# Patient Record
Sex: Male | Born: 1935 | Race: White | Hispanic: No | Marital: Married | Smoking: Former smoker
Health system: Southern US, Community
[De-identification: ages and names within clinical notes are randomized; demographics above are authoritative.]

## PROBLEM LIST (undated history)

## (undated) DIAGNOSIS — I639 Cerebral infarction, unspecified: Secondary | ICD-10-CM

## (undated) DIAGNOSIS — I4949 Other premature depolarization: Secondary | ICD-10-CM

## (undated) DIAGNOSIS — E7800 Pure hypercholesterolemia, unspecified: Secondary | ICD-10-CM

## (undated) DIAGNOSIS — I2699 Other pulmonary embolism without acute cor pulmonale: Secondary | ICD-10-CM

## (undated) DIAGNOSIS — M1990 Unspecified osteoarthritis, unspecified site: Secondary | ICD-10-CM

## (undated) DIAGNOSIS — I10 Essential (primary) hypertension: Secondary | ICD-10-CM

## (undated) DIAGNOSIS — C801 Malignant (primary) neoplasm, unspecified: Secondary | ICD-10-CM

## (undated) DIAGNOSIS — I4891 Unspecified atrial fibrillation: Secondary | ICD-10-CM

## (undated) DIAGNOSIS — J449 Chronic obstructive pulmonary disease, unspecified: Secondary | ICD-10-CM

## (undated) DIAGNOSIS — R7303 Prediabetes: Secondary | ICD-10-CM

## (undated) DIAGNOSIS — Z86711 Personal history of pulmonary embolism: Secondary | ICD-10-CM

## (undated) DIAGNOSIS — Z8489 Family history of other specified conditions: Secondary | ICD-10-CM

## (undated) DIAGNOSIS — R739 Hyperglycemia, unspecified: Secondary | ICD-10-CM

## (undated) HISTORY — DX: Unspecified atrial fibrillation: I48.91

## (undated) HISTORY — DX: Unspecified osteoarthritis, unspecified site: M19.90

## (undated) HISTORY — DX: Cerebral infarction, unspecified: I63.9

## (undated) HISTORY — PX: JOINT REPLACEMENT: SHX530

## (undated) HISTORY — DX: Essential (primary) hypertension: I10

## (undated) HISTORY — PX: TONSILLECTOMY: SUR1361

## (undated) HISTORY — DX: Chronic obstructive pulmonary disease, unspecified: J44.9

## (undated) HISTORY — DX: Hyperglycemia, unspecified: R73.9

## (undated) HISTORY — DX: Other premature depolarization: I49.49

## (undated) HISTORY — DX: Personal history of pulmonary embolism: Z86.711

## (undated) HISTORY — DX: Pure hypercholesterolemia, unspecified: E78.00

## (undated) HISTORY — DX: Other pulmonary embolism without acute cor pulmonale: I26.99

---

## 1995-07-13 DIAGNOSIS — I639 Cerebral infarction, unspecified: Secondary | ICD-10-CM

## 1995-07-13 HISTORY — DX: Cerebral infarction, unspecified: I63.9

## 2000-08-12 DIAGNOSIS — Z86711 Personal history of pulmonary embolism: Secondary | ICD-10-CM

## 2000-08-12 HISTORY — DX: Personal history of pulmonary embolism: Z86.711

## 2001-06-14 ENCOUNTER — Encounter: Payer: Self-pay | Admitting: Cardiovascular Disease

## 2002-07-11 ENCOUNTER — Encounter: Payer: Self-pay | Admitting: Cardiovascular Disease

## 2004-01-06 ENCOUNTER — Encounter: Payer: Self-pay | Admitting: Cardiovascular Disease

## 2004-04-11 ENCOUNTER — Encounter: Payer: Self-pay | Admitting: Internal Medicine

## 2005-12-02 ENCOUNTER — Ambulatory Visit: Payer: Self-pay | Admitting: Internal Medicine

## 2005-12-23 ENCOUNTER — Ambulatory Visit: Payer: Self-pay | Admitting: Internal Medicine

## 2006-03-15 ENCOUNTER — Emergency Department: Payer: Self-pay | Admitting: Emergency Medicine

## 2006-04-14 ENCOUNTER — Ambulatory Visit: Payer: Self-pay | Admitting: Urology

## 2006-06-28 ENCOUNTER — Other Ambulatory Visit: Payer: Self-pay

## 2006-06-28 ENCOUNTER — Ambulatory Visit: Payer: Self-pay | Admitting: Gastroenterology

## 2008-10-15 ENCOUNTER — Ambulatory Visit: Payer: Self-pay | Admitting: Gastroenterology

## 2009-02-25 ENCOUNTER — Ambulatory Visit: Payer: Self-pay | Admitting: Internal Medicine

## 2010-02-04 ENCOUNTER — Encounter: Payer: Self-pay | Admitting: Cardiovascular Disease

## 2010-02-04 ENCOUNTER — Encounter: Payer: Self-pay | Admitting: Internal Medicine

## 2010-02-17 ENCOUNTER — Encounter: Payer: Self-pay | Admitting: Internal Medicine

## 2010-03-05 ENCOUNTER — Ambulatory Visit: Payer: Self-pay | Admitting: Internal Medicine

## 2010-03-05 ENCOUNTER — Encounter: Payer: Self-pay | Admitting: Cardiovascular Disease

## 2010-03-05 DIAGNOSIS — E669 Obesity, unspecified: Secondary | ICD-10-CM

## 2010-03-05 DIAGNOSIS — J449 Chronic obstructive pulmonary disease, unspecified: Secondary | ICD-10-CM

## 2010-03-05 DIAGNOSIS — R0609 Other forms of dyspnea: Secondary | ICD-10-CM

## 2010-03-05 DIAGNOSIS — R0602 Shortness of breath: Secondary | ICD-10-CM | POA: Insufficient documentation

## 2010-03-05 DIAGNOSIS — I4949 Other premature depolarization: Secondary | ICD-10-CM

## 2010-03-05 DIAGNOSIS — I4891 Unspecified atrial fibrillation: Secondary | ICD-10-CM

## 2010-03-05 DIAGNOSIS — R0989 Other specified symptoms and signs involving the circulatory and respiratory systems: Secondary | ICD-10-CM

## 2010-03-05 HISTORY — DX: Unspecified atrial fibrillation: I48.91

## 2010-03-05 HISTORY — DX: Other premature depolarization: I49.49

## 2010-03-19 ENCOUNTER — Ambulatory Visit: Payer: Self-pay

## 2010-03-27 ENCOUNTER — Ambulatory Visit: Payer: Self-pay | Admitting: Internal Medicine

## 2010-04-01 ENCOUNTER — Telehealth: Payer: Self-pay | Admitting: Cardiovascular Disease

## 2010-08-11 NOTE — Letter (Signed)
Summary: [facility] New Visit Note   [facility] New Visit Note   Imported By: Roderic Ovens 04/01/2010 15:19:35  _____________________________________________________________________  External Attachment:    Type:   Image     Comment:   External Document

## 2010-08-11 NOTE — Assessment & Plan Note (Signed)
Summary: ROV/AMD   Visit Type:  Follow-up Primary Provider:  Dr. Boykin Peek  CC:  Denies chest pain or shortness of breath.  "Doing well.".  History of Present Illness: [name] Hardin returns  today for followup of  PVC's in the setting of atrial fibrillation.  He appears to be stable since I last saw him.  He denies c/p.  His dyspnea appears to be class 2. He does not feel palpitations and does not know that he is in atrial fibrillation.  I do not have any additional information as to the duration of his atrial fibrillation.  He also has COPD and morbid obesity.  He has never had syncope and denies c/p.  He is interested in considering Pradaxa.  Current Medications (verified): 1)  Advair Diskus 250-50 Mcg/dose Aepb (Fluticasone-Salmeterol) .... One Puff Two Times A Day 2)  Allopurinol 300 Mg Tabs (Allopurinol) .... One Tablet Once Daily 3)  Aspir-Low 81 Mg Tbec (Aspirin) 4)  B12 .... Administer Once Monthly 5)  Colcrys 0.6 Mg Tabs (Colchicine) .... One Tablet Three Times A Day As Needed 6)  Coumadin 5 Mg Tabs (Warfarin Sodium) .... One Tablet Once Daily 7)  Cpap 8)  Diltiazem Hcl Er Beads 240 Mg Xr24h-Cap (Diltiazem Hcl Er Beads) .... Once Tablet Once Daily 9)  Simvastatin 40 Mg Tabs (Simvastatin) .... Take One Tablet By Mouth Daily At Bedtime 10)  Furosemide 20 Mg Tabs (Furosemide) .... Take One Tablet By Mouth Daily. 11)  Potassium Chloride Cr 10 Meq Cr-Caps (Potassium Chloride) .... Take One Tablet By Mouth Daily 12)  Co-Gesic 5-500 Mg Tabs (Hydrocodone-Acetaminophen) .Marland Kitchen.. 1 Tablet At Bedtime 13)  Spiriva Handihaler 18 Mcg Caps (Tiotropium Bromide Monohydrate) .... Once Daily  Allergies (verified): No Known Drug Allergies  Past History:  Past Medical History: Last updated: 03/05/2010 Cerebrovascular disease s/p stroke in 1997 fully recovered COPD Hypertension Hypercholesterolemia Osteoarthritis Obesity Heperglycemia Hx of pulmonary embolus February 2002  Past  Surgical History: Last updated: 03/05/2010 Tonsillectomy  Family History: Last updated: 03/05/2010 Family History of Hyperlipidemia:   Review of Systems  The patient denies chest pain, syncope, dyspnea on exertion, and peripheral edema.    Vital Signs:  Patient profile:   77 year old male Height:      72 inches Weight:      326 pounds BMI:     44.37 Pulse rate:   80 / minute BP sitting:   130 / 80  (left arm) Cuff size:   large  Vitals Entered By: Bishop Dublin, CMA (March 27, 2010 10:14 AM)  Physical Exam  General:  Obese, well developed, well nourished, in no acute distress.  HEENT: normal Neck: supple. No JVD. Carotids 2+ bilaterally no bruits Cor: RRR no rubs, gallops or murmur Lungs: CTA Ab: soft, nontender. nondistended. No HSM. Good bowel sounds. Obese. Ext: warm. no cyanosis or clubbing. 1+ peripheral edema Neuro: alert and oriented. Grossly nonfocal. affect pleasant    Impression & Recommendations:  Problem # 1:  ATRIAL FIBRILLATION (ICD-427.31) His symptoms appear to be controlled.  I have discussed the possibility of switching to Pradaxa from coumadin and he is considering his options. His updated medication list for this problem includes:    Aspir-low 81 Mg Tbec (Aspirin)    Coumadin 5 Mg Tabs (Warfarin sodium) ..... One tablet once daily  Problem # 2:  PREMATURE VENTRICULAR CONTRACTIONS (ICD-427.69) These appear to be asymptomatic.  Will follow. His updated medication list for this problem includes:    Aspir-low 81 Mg Tbec (  Aspirin)    Coumadin 5 Mg Tabs (Warfarin sodium) ..... One tablet once daily    Diltiazem Hcl Er Beads 240 Mg Xr24h-cap (Diltiazem hcl er beads) ..... Once tablet once daily  Problem # 3:  SHORTNESS OF BREATH (ICD-786.05) I suspect this is multifactorial.  He has preserved LV function.  I have instructed him on the importance of weight loss.  He will continue his meds below and maintain a low sodium diet. His updated  medication list for this problem includes:    Aspir-low 81 Mg Tbec (Aspirin)    Diltiazem Hcl Er Beads 240 Mg Xr24h-cap (Diltiazem hcl er beads) ..... Once tablet once daily    Furosemide 20 Mg Tabs (Furosemide) .Marland Kitchen... Take one tablet by mouth daily.

## 2010-08-11 NOTE — Progress Notes (Signed)
Summary: PLEASE CALL  Phone Note Call from Patient Call back at Home Phone [phone]   Caller: SELF Call For: [facility] Summary of Call: PT LEFT A MESSAGE THAT HE WOULD LIKE A CALL BACK Initial call taken by: Harlon Flor,  April 01, 2010 10:41 AM  Follow-up for Phone Call        [facility] TCB Benedict Needy, RN  April 01, 2010 11:39 AM   Additional Follow-up for Phone Call Additional follow up Details #1::        PAT RETURNING CALL-DOES NOT NEED A CALL BACK-STATES THAT EVERYTHING HAS BEEN STRAIGHTENED OUT. Additional Follow-up by: Harlon Flor,  April 01, 2010 2:24 PM

## 2010-08-11 NOTE — Assessment & Plan Note (Signed)
Summary: NP6/AMD   Visit Type:  New Pt Primary Provider:  Dr. Boykin Peek  CC:  no complaints.  History of Present Illness: Mr. [name] Hardin is referred today by Dr. Dareen Piano for evaluation PVC's in the setting of atrial fibrillation.  The patient has had a long period of sob which may have worsened over the past several months. He does not feel palpitations and does not know that he is in atrial fibrillation.  I do not have any additional information as to the duration of his atrial fibrillation.  He also has COPD and morbid obesity.  He states that he has been heavy for many yrs.  He has developed worsening peripheral edema. He had a 24 hour holter which demonstrated atrial fibrillation. On the holter there were PVC's and Ashman beats.  He has never had syncope and denies c/p.   Problems Prior to Update: None  Medications Prior to Update: 1)  Advair Diskus 250-50 Mcg/dose Aepb (Fluticasone-Salmeterol) .... One Puff Two Times A Day 2)  Allopurinol 300 Mg Tabs (Allopurinol) .... One Tablet Once Daily 3)  Aspir-Low 81 Mg Tbec (Aspirin) 4)  B12 .... Administer Once Monthly 5)  Colcrys 0.6 Mg Tabs (Colchicine) .... One Tablet Three Times A Day As Needed 6)  Coumadin 4 Mg Tabs (Warfarin Sodium) .... Take As Directed 7)  Cpap 8)  Diltiazem Hcl Er Beads 240 Mg Xr24h-Cap (Diltiazem Hcl Er Beads) .... Once Tablet Once Daily  Current Medications (verified): 1)  Advair Diskus 250-50 Mcg/dose Aepb (Fluticasone-Salmeterol) .... One Puff Two Times A Day 2)  Allopurinol 300 Mg Tabs (Allopurinol) .... One Tablet Once Daily 3)  Aspir-Low 81 Mg Tbec (Aspirin) 4)  B12 .... Administer Once Monthly 5)  Colcrys 0.6 Mg Tabs (Colchicine) .... One Tablet Three Times A Day As Needed 6)  Coumadin 4 Mg Tabs (Warfarin Sodium) .... Take As Directed 7)  Cpap 8)  Diltiazem Hcl Er Beads 240 Mg Xr24h-Cap (Diltiazem Hcl Er Beads) .... Once Tablet Once Daily 9)  Simvastatin 40 Mg Tabs (Simvastatin) .... Take One  Tablet By Mouth Daily At Bedtime 10)  Furosemide 20 Mg Tabs (Furosemide) .... Take One Tablet By Mouth Daily. 11)  Potassium Chloride Cr 10 Meq Cr-Caps (Potassium Chloride) .... Take One Tablet By Mouth Daily 12)  Co-Gesic 5-500 Mg Tabs (Hydrocodone-Acetaminophen) .Marland Kitchen.. 1 Tablet At Bedtime 13)  Spiriva Handihaler 18 Mcg Caps (Tiotropium Bromide Monohydrate) .... Once Daily  Allergies (verified): No Known Drug Allergies  Past History:  Past Medical History: Last updated: 03/05/2010 Cerebrovascular disease s/p stroke in 1997 fully recovered COPD Hypertension Hypercholesterolemia Osteoarthritis Obesity Heperglycemia Hx of pulmonary embolus February 2002  Past Surgical History: Last updated: 03/05/2010 Tonsillectomy  Family History: Family History of Hyperlipidemia:   Review of Systems       The patient complains of dyspnea on exertion and peripheral edema.  The patient denies chest pain and syncope.         All systems reviewed and negative except as noted in the HPI.  Vital Signs:  Patient profile:   77 year old male Height:      72 inches Weight:      330 pounds BMI:     44.92 Pulse rate:   90 / minute BP sitting:   142 / 70  Physical Exam  General:  Obese, well developed, well nourished, in no acute distress.  HEENT: normal Neck: supple. No JVD. Carotids 2+ bilaterally no bruits Cor: RRR no rubs, gallops or murmur Lungs: CTA Ab:  soft, nontender. nondistended. No HSM. Good bowel sounds. Obese. Ext: warm. no cyanosis or clubbing. 1+ peripheral edema Neuro: alert and oriented. Grossly nonfocal. affect pleasant    Impression & Recommendations:  Problem # 1:  ATRIAL FIBRILLATION (ICD-427.31) HIs rate appears to be well controlled.  It is unclear how long he has been out of rhythm.  If more than a year, the likelihood of keeping him in rhythm is low and I would recommend continued rate control and coumadin. His updated medication list for this problem includes:     Aspir-low 81 Mg Tbec (Aspirin)    Coumadin 4 Mg Tabs (Warfarin sodium) .Marland Kitchen... 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 His updated medication list for this problem includes:    Aspir-low 81 Mg Tbec (Aspirin)    Coumadin 4 Mg Tabs (Warfarin sodium) .Marland Kitchen... Take as directed    Diltiazem Hcl Er Beads 240 Mg Xr24h-cap (Diltiazem hcl er beads) ..... Once tablet once daily  Problem # 3:  DYSPNEA ON EXERTION (ICD-786.09) I suspect this is a multifactorial problem.  I will plan a 2D echo and consider additional testing based on the results of his echo. His updated medication list for this problem includes:    Aspir-low 81 Mg Tbec (Aspirin)    Diltiazem Hcl Er Beads 240 Mg Xr24h-cap (Diltiazem hcl er beads) ..... Once tablet once daily    Furosemide 20 Mg Tabs (Furosemide) .Marland Kitchen... Take one tablet by mouth daily.  Patient Instructions: 1)  Your physician recommends that you schedule a follow-up appointment in: September 2)  Your physician recommends that you continue on your current medications as directed. Please refer to the Current Medication list given to you today. 3)  Your physician has requested that you have an echocardiogram.  Echocardiography is a painless test that uses sound waves to create images of your heart. It provides your doctor with information about the size and shape of your heart and how well your heart's chambers and valves are working.  This procedure takes approximately one hour. There are no restrictions for this procedure.

## 2010-08-11 NOTE — Letter (Signed)
Summary: PHI  PHI   Imported By: Harlon Flor 03/12/2010 15:36:09  _____________________________________________________________________  External Attachment:    Type:   Image     Comment:   External Document

## 2010-08-14 NOTE — Procedures (Signed)
Summary: [facility]   Imported By: Roderic Ovens 04/01/2010 15:39:24  _____________________________________________________________________  External Attachment:    Type:   Image     Comment:   External Document

## 2010-10-14 ENCOUNTER — Ambulatory Visit: Payer: Self-pay | Admitting: Cardiovascular Disease

## 2011-04-20 ENCOUNTER — Ambulatory Visit: Payer: Self-pay | Admitting: Internal Medicine

## 2011-05-25 ENCOUNTER — Ambulatory Visit: Payer: Self-pay | Admitting: Specialist

## 2011-11-01 ENCOUNTER — Ambulatory Visit: Payer: Self-pay | Admitting: General Practice

## 2011-11-01 DIAGNOSIS — I10 Essential (primary) hypertension: Secondary | ICD-10-CM

## 2011-11-01 LAB — CBC
MCHC: 32.8 g/dL (ref 32.0–36.0)
MCV: 98 fL (ref 80–100)
Platelet: 165 10*3/uL (ref 150–440)
WBC: 7.2 10*3/uL (ref 3.8–10.6)

## 2011-11-01 LAB — PROTIME-INR
INR: 2.2
Prothrombin Time: 24.9 s — ABNORMAL HIGH

## 2011-11-01 LAB — URINALYSIS, COMPLETE
Ketone: NEGATIVE
Ph: 5 (ref 4.5–8.0)
Protein: 100
RBC,UR: 943 /HPF (ref 0–5)
Squamous Epithelial: 2

## 2011-11-01 LAB — SEDIMENTATION RATE: Erythrocyte Sed Rate: 7 mm/h

## 2011-11-01 LAB — MRSA PCR SCREENING

## 2011-11-01 LAB — APTT: Activated PTT: 39.2 secs — ABNORMAL HIGH (ref 23.6–35.9)

## 2011-11-03 LAB — URINE CULTURE

## 2011-11-12 ENCOUNTER — Ambulatory Visit: Payer: Self-pay | Admitting: Vascular Surgery

## 2011-11-12 LAB — PROTIME-INR
INR: 1.2
Prothrombin Time: 15.6 secs — ABNORMAL HIGH (ref 11.5–14.7)

## 2011-11-15 ENCOUNTER — Inpatient Hospital Stay: Payer: Self-pay | Admitting: General Practice

## 2011-11-15 LAB — PROTIME-INR
INR: 1
Prothrombin Time: 14 secs (ref 11.5–14.7)

## 2011-11-16 LAB — PROTIME-INR
INR: 1.1
Prothrombin Time: 14.8 secs — ABNORMAL HIGH (ref 11.5–14.7)

## 2011-11-16 LAB — BASIC METABOLIC PANEL
Anion Gap: 7 (ref 7–16)
BUN: 12 mg/dL (ref 7–18)
Calcium, Total: 8 mg/dL — ABNORMAL LOW (ref 8.5–10.1)
Chloride: 105 mmol/L (ref 98–107)
Co2: 27 mmol/L (ref 21–32)
Creatinine: 0.93 mg/dL (ref 0.60–1.30)
EGFR (African American): >60
EGFR (Non-African Amer.): >60
Glucose: 137 mg/dL — ABNORMAL HIGH (ref 65–99)
Osmolality: 279 (ref 275–301)
Potassium: 4.2 mmol/L (ref 3.5–5.1)

## 2011-11-16 LAB — PLATELET COUNT: Platelet: 141 10*3/uL — ABNORMAL LOW (ref 150–440)

## 2011-11-16 LAB — HEMOGLOBIN: HGB: 11.8 g/dL — ABNORMAL LOW (ref 13.0–18.0)

## 2011-11-17 LAB — BASIC METABOLIC PANEL
Anion Gap: 6 — ABNORMAL LOW (ref 7–16)
Calcium, Total: 7.8 mg/dL — ABNORMAL LOW (ref 8.5–10.1)
Co2: 26 mmol/L (ref 21–32)
Creatinine: 1.07 mg/dL (ref 0.60–1.30)
EGFR (African American): >60
Glucose: 147 mg/dL — ABNORMAL HIGH (ref 65–99)
Osmolality: 277 (ref 275–301)

## 2011-11-17 LAB — PROTIME-INR: INR: 1.3

## 2011-11-18 LAB — PROTIME-INR: Prothrombin Time: 19.3 secs — ABNORMAL HIGH (ref 11.5–14.7)

## 2011-11-30 ENCOUNTER — Ambulatory Visit: Payer: Self-pay

## 2012-07-27 ENCOUNTER — Ambulatory Visit: Payer: Self-pay | Admitting: Unknown Physician Specialty

## 2012-07-28 LAB — PATHOLOGY REPORT

## 2013-11-09 DIAGNOSIS — Z7901 Long term (current) use of anticoagulants: Secondary | ICD-10-CM | POA: Insufficient documentation

## 2013-12-16 DIAGNOSIS — J449 Chronic obstructive pulmonary disease, unspecified: Secondary | ICD-10-CM

## 2013-12-16 DIAGNOSIS — D510 Vitamin B12 deficiency anemia due to intrinsic factor deficiency: Secondary | ICD-10-CM | POA: Insufficient documentation

## 2013-12-16 DIAGNOSIS — N183 Chronic kidney disease, stage 3 unspecified: Secondary | ICD-10-CM | POA: Insufficient documentation

## 2013-12-16 DIAGNOSIS — E1169 Type 2 diabetes mellitus with other specified complication: Secondary | ICD-10-CM | POA: Insufficient documentation

## 2013-12-16 DIAGNOSIS — I2699 Other pulmonary embolism without acute cor pulmonale: Secondary | ICD-10-CM

## 2013-12-16 DIAGNOSIS — E119 Type 2 diabetes mellitus without complications: Secondary | ICD-10-CM | POA: Insufficient documentation

## 2013-12-16 DIAGNOSIS — M1990 Unspecified osteoarthritis, unspecified site: Secondary | ICD-10-CM | POA: Insufficient documentation

## 2013-12-16 DIAGNOSIS — I4892 Unspecified atrial flutter: Secondary | ICD-10-CM | POA: Insufficient documentation

## 2013-12-16 DIAGNOSIS — I10 Essential (primary) hypertension: Secondary | ICD-10-CM | POA: Insufficient documentation

## 2013-12-16 DIAGNOSIS — M109 Gout, unspecified: Secondary | ICD-10-CM | POA: Insufficient documentation

## 2013-12-16 HISTORY — DX: Other pulmonary embolism without acute cor pulmonale: I26.99

## 2013-12-16 HISTORY — DX: Chronic obstructive pulmonary disease, unspecified: J44.9

## 2013-12-16 HISTORY — DX: Essential (primary) hypertension: I10

## 2014-04-20 DIAGNOSIS — IMO0002 Reserved for concepts with insufficient information to code with codable children: Secondary | ICD-10-CM | POA: Insufficient documentation

## 2014-07-02 ENCOUNTER — Ambulatory Visit: Payer: Self-pay

## 2014-08-05 ENCOUNTER — Ambulatory Visit: Payer: Self-pay | Admitting: Urology

## 2014-08-07 ENCOUNTER — Ambulatory Visit: Payer: Self-pay | Admitting: Urology

## 2014-08-07 LAB — BASIC METABOLIC PANEL
ANION GAP: 9 (ref 7–16)
BUN: 20 mg/dL — AB (ref 7–18)
CALCIUM: 9.4 mg/dL (ref 8.5–10.1)
Chloride: 108 mmol/L — ABNORMAL HIGH (ref 98–107)
Co2: 25 mmol/L (ref 21–32)
Creatinine: 1.44 mg/dL — ABNORMAL HIGH (ref 0.60–1.30)
GFR CALC NON AF AMER: 50 — AB
Glucose: 111 mg/dL — ABNORMAL HIGH (ref 65–99)
Osmolality: 286 (ref 275–301)
Potassium: 4.2 mmol/L (ref 3.5–5.1)
Sodium: 142 mmol/L (ref 136–145)

## 2014-08-07 LAB — CBC WITH DIFFERENTIAL/PLATELET
BASOS PCT: 0.3 %
Basophil #: 0 10*3/uL (ref 0.0–0.1)
EOS ABS: 0.2 10*3/uL (ref 0.0–0.7)
Eosinophil %: 2.5 %
HCT: 44.8 % (ref 40.0–52.0)
HGB: 14.7 g/dL (ref 13.0–18.0)
LYMPHS ABS: 2.3 10*3/uL (ref 1.0–3.6)
Lymphocyte %: 26.6 %
MCH: 32 pg (ref 26.0–34.0)
MCHC: 32.8 g/dL (ref 32.0–36.0)
MCV: 97 fL (ref 80–100)
MONO ABS: 0.7 x10 3/mm (ref 0.2–1.0)
Monocyte %: 8.4 %
Neutrophil #: 5.5 10*3/uL (ref 1.4–6.5)
Neutrophil %: 62.2 %
Platelet: 152 10*3/uL (ref 150–440)
RBC: 4.6 10*6/uL (ref 4.40–5.90)
RDW: 14.5 % (ref 11.5–14.5)
WBC: 8.8 10*3/uL (ref 3.8–10.6)

## 2014-08-07 LAB — PROTIME-INR
INR: 1.1
PROTHROMBIN TIME: 14.4 s (ref 11.5–14.7)

## 2014-11-03 NOTE — Op Note (Signed)
PATIENT NAME:  [name] Hardin, [name] Hardin MR#:  [id number] DATE OF BIRTH:  [birth year]  DATE OF PROCEDURE:  11/12/2011  PREOPERATIVE DIAGNOSES:  1. History of hypercoagulable state.  2. Deep venous thrombosis with pulmonary embolism.  3. Degenerative joint disease requiring joint replacement.   POSTOPERATIVE DIAGNOSES:  1. History of hypercoagulable state.  2. Deep venous thrombosis with pulmonary embolism.  3. Degenerative joint disease requiring joint replacement.   PROCEDURE PERFORMED:  1. Insertion of IVC filter.  2. Inferior venacavogram.   SURGEON: Hortencia Pilar, MD    SEDATION: Versed 3 mg plus fentanyl 100 mcg administered IV. Continuous ECG, pulse oximetry, and cardiopulmonary monitoring is performed throughout the entire procedure by the interventional radiology nurse. Total sedation time is 30 minutes.   ACCESS: 9.5 French sheath right greater saphenous vein.   CONTRAST USED: Isovue 20 mL.   FLUOROSCOPY TIME: 0.5 minutes.   INDICATIONS: Mr. Claud is a 81 year old gentleman who will require a total joint and he has an extensive history of multiple episodes of deep venous thrombosis as well as significant pulmonary embolism in the past. He is maintained on lifelong Coumadin secondary to hypercoagulable state. Prior to the surgery he is undergoing placement of an IVC filter to prevent lethal pulmonary embolism. The risks and benefits were reviewed. All questions are answered. The patient has also been informed and agrees that the filter should be removed in the next  3 to 4 months. He wishes to proceed.   DESCRIPTION OF PROCEDURE: The patient is taken to Special Procedures and placed in the supine position. After adequate sedation is achieved, the right groin is prepped and draped in sterile fashion. Ultrasound is placed in sterile sleeve. Ultrasound is utilized secondary to lack of appropriate landmarks and to avoid vascular injury. Under direct ultrasound visualization, the saphenous  vein is identified. It is followed easily down to the saphenofemoral junction. It is quite large. It is fairly easy to access, and under direct ultrasound visualization after confirming saphenous vein patency with compression, an image is recorded and a Seldinger needle is inserted into the saphenous vein without difficulty. J-wire is advanced followed by advancing the delivery sheath. The delivery sheath is positioned just above the iliac confluence and bolus injection of contrast is utilized to demonstrate the inferior vena cava. The inferior vena cava is widely patent. It is measured just below the renal blushes and noted to be 24 mm in diameter. Meridian filter is then deployed just below this level at the L2 level.   The filter is in good orientation. The sheath is removed. Pressure is held and there are no immediate complications.   INTERPRETATION: The inferior vena cava is opacified with bolus injection of contrast. There are no filling defects noted. The cava is of appropriate size and Meridian filter is deployed infra-renally.    ____________________________ Katha Cabal, MD ggs:bjt D: 11/12/2011 08:47:10 ET T: 11/12/2011 10:01:08 ET JOB#: [id number]  cc: Katha Cabal, MD, <Dictator> Ocie Cornfield. Ouida Sills, MD Laurice Record. Holley Bouche., MD Katha Cabal MD ELECTRONICALLY SIGNED 11/19/2011 7:52

## 2014-11-03 NOTE — Op Note (Signed)
PATIENT NAME:  [name] Hardin, RADLE MR#:  [id number] DATE OF BIRTH:  [birth year]  DATE OF PROCEDURE:  11/15/2011  PREOPERATIVE DIAGNOSIS: Degenerative arthrosis of the right knee.   POSTOPERATIVE DIAGNOSIS: Degenerative arthrosis of the right knee.   PROCEDURE PERFORMED: Right total knee arthroplasty using computer-assisted navigation.   SURGEON: Laurice Record. Holley Bouche., MD   ASSISTANT: Vance Peper, PA-C (required to maintain retraction throughout the procedure)   ANESTHESIA: Spinal.   ESTIMATED BLOOD LOSS: 750 mL.  FLUIDS REPLACED: 2500 mL of Crystalloid.   TOURNIQUET TIME: 103 minutes.   DRAINS: Two medium drains to reinfusion system.   SOFT TISSUE RELEASES: Anterior cruciate ligament, posterior cruciate ligament, deep medial collateral ligament, and patellofemoral ligament.   IMPLANTS UTILIZED: DePuy PFC Sigma size 5 posterior stabilized femoral component (cemented), size 5 MBT tibial component (cemented), 41 mm three peg oval dome patella (cemented), and a 10 mm stabilized rotating platform polyethylene insert.   INDICATIONS FOR SURGERY: The patient is a 81 year old male who has been seen for complaints of progressive right knee pain. X-rays demonstrated severe degenerative changes in tricompartmental fashion. After discussion of the risks and benefits of surgical intervention, the patient expressed his understanding of the risks and benefits and agreed with plans for surgical intervention.   PROCEDURE IN DETAIL: The patient was brought in the operating room and, after adequate spinal anesthesia was achieved, a tourniquet was placed on the patient's upper right thigh. The patient's right knee and leg were cleaned and prepped with alcohol and DuraPrep and draped in the usual sterile fashion. A "time-out" was performed as per usual protocol. The right lower extremity was exsanguinated using an Esmarch and the tourniquet was inflated to 300 mmHg. Anterior longitudinal incision was made followed  by a standard mid vastus approach. A large effusion was evacuated. The deep fibers of the medial collateral ligament were elevated in a subperiosteal fashion off the medial flare of the tibia so as to maintain a continuous soft tissue sleeve. Patella was subluxed laterally and the patellofemoral ligament was incised. Inspection of the knee demonstrated severe degenerative changes in tricompartmental fashion with full thickness loss of articular cartilage and eburnated bone noted to the medial compartment. Prominent osteophytes were debrided using a rongeur. Anterior and posterior cruciate ligaments were excised. Two 4.5 mm Schanz pins were inserted into the femur and into the tibia for attachment of the array of spheres used for computer-assisted navigation. Hip center was identified using circumduction technique. Distal landmarks were mapped using the computer. Distal femur and proximal tibia were mapped using the computer. The distal femoral cutting guide was positioned using computer-assisted navigation so as to achieve a 5 degree distal valgus cut. Cut was performed and verified using the computer. Distal femur was sized and it was felt that a size 5 femoral component was appropriate. A 5 five cutting guide was positioned using computer-assisted navigation and the anterior cut was performed and verified using the computer. This was followed by completion of the posterior and chamfer cuts. Femoral cutting guide for central box was then positioned and central box cut was performed.   Attention was then directed to the proximal tibia. Medial and lateral menisci were excised. The extramedullary tibial cutting guide was positioned using computer-assisted navigation so as to achieve a 0 degree varus valgus alignment and 0 degree posterior slope. Cut was performed and verified using the computer. Proximal tibia was sized and it was felt that a size 5 tibial tray was appropriate. Tibial and femoral trials were  inserted followed by insertion of a 10 mm polyethylene insert. Good medial and lateral soft tissue balancing was appreciated both in full extension and in 90 degrees of flexion. Finally, the patella was cut and prepared so as to accommodate a 41 mm three peg oval dome patella. Patellar trial was placed and the knee was placed through a range of motion with excellent patellar tracking appreciated. Femoral trial was removed. Posterior osteophytes were debrided from the femoral condyles using curved osteotome and curettes. Central post hole for the tibial component was reamed followed by insertion of a keel punch. Tibial trials were then removed. Cut surfaces of bone were irrigated with copious amounts of normal saline with antibiotic solution using pulsatile lavage and then suctioned dry. Polymethyl methacrylate cement was prepared in the usual fashion using a vacuum mixer. Cement was applied to the cut surface of the proximal tibia as well as along the undersurface of a size 5 MBT tibial component. Tibial component was positioned and impacted into place. Excess cement was removed using freer elevators. Cement was then applied to the cut surface of the femur as well as along the posterior flanges of a size 5 posterior stabilized femoral component. Femoral component was positioned and impacted into place. Excess cement was removed using freer elevators. A 10 mm polyethylene insert was inserted and the knee was brought into full extension with steady axial compression applied. Finally, cement was applied to the backside of a 41 mm three peg oval dome patella and patellar component was positioned and patellar clamp applied. Excess cement was removed using freer elevators.   After adequate curing of cement, tourniquet was deflated after a total tourniquet time of 103 minutes. A moderate degree of oozing was noted from the soft tissues and was felt to be consistent with the patient's anti-coagulation bridging with  Lovenox. Wound was irrigated with copious amounts of normal saline with antibiotic solution using pulsatile lavage and then suctioned dry. Reasonably good hemostasis was appreciated using electrocautery. The knee was inspected for any residual cement debris. 30 mL of 0.25% Marcaine with epinephrine was injected along the posterior capsule. A 10 mm stabilized rotating platform polyethylene insert was inserted and the knee was placed through a range of motion. Excellent mediolateral soft tissue balancing was appreciated both in full extension and in 90 degrees of flexion. Excellent patellar tracking was appreciated. Two medium drains were placed in the wound bed and brought out through a separate stab incision to be attached to a reinfusion system. The medial parapatellar portion of the incision was reapproximated using interrupted sutures of #1 Vicryl. Subcutaneous tissue was approximated in layers using first #0 Vicryl followed by #2-0 Vicryl. Skin was closed with skin staples. A sterile dressing was applied.   The patient tolerated the procedure well. He was transported to the recovery room in stable condition.   ____________________________ Laurice Record. Holley Bouche., MD jph:[id number] D: 11/16/2011 02:02:31 ET T: 11/16/2011 10:41:49 ET JOB#: [id number]  cc: Laurice Record. Holley Bouche., MD, <Dictator> JAMES P Holley Bouche MD ELECTRONICALLY SIGNED 11/27/2011 21:19

## 2014-11-03 NOTE — Discharge Summary (Signed)
PATIENT NAME:  [name], [name] MR#:  Hardin DATE OF BIRTH:  1932-01-13  DATE OF ADMISSION:  11/15/2011 DATE OF DISCHARGE:  11/18/2011  ADMITTING DIAGNOSIS: Degenerative arthrosis of right knee.   DISCHARGE DIAGNOSIS: Degenerative arthrosis of right knee.   HISTORY: Patient is a pleasant 81 year old who has been followed at [facility] for progression of right knee pain. The patient has had a long history of progressive right knee pain. He has been unable to take anti-inflammatory medications due to his chronic anticoagulation therapy with Coumadin. He had not seen any improvement in his symptoms despite intra-articular cortisone injections as well as a series of Synvisc injections. The patient had noted swelling of the knee as well as near giving way. He had localized most of the pain along the medial aspect of the knee. The pain was aggravated with weight-bearing activities. He is having increased discomfort will going up and down steps. At the time of surgery he was not using any ambulatory aid. The right knee pain had progressed to the point that it was significantly interfering with his activities of daily living. X-rays taken in the clinic showed narrowing of the medial cartilage space with associated varus alignment. Osteophyte as well as subchondral sclerosis was noted. There was noted to be degenerative changes to the patellofemoral articulation as well. After discussion of the risks and benefits of surgical intervention, the patient expressed his understanding of the risks and benefits and agreed for plans for surgical intervention.   PROCEDURE: Right total knee arthroplasty using computer-assisted navigation.   ANESTHESIA: Spinal.   SOFT TISSUE RELEASE: Anterior cruciate ligament, posterior cruciate ligament, deep medial collateral ligament, as well as the patellofemoral ligament.   IMPLANTS UTILIZED: DePuy PFC Sigma size 5 posterior stabilized femoral component (cemented), size 5  MBT tibial component (cemented), 41 mm three pegged oval dome patella (cemented), and a 10 mm stabilized rotating platform polyethylene insert.   HOSPITAL COURSE: Patient tolerated the procedure very well. He had no complications. He was then taken to PAC-U where he was stabilized then transferred to the orthopedic floor. He began receiving anticoagulation therapy of Lovenox 30 mg subcutaneous every 12 hours per anesthesia protocol. He was fitted with TED stockings bilaterally. These are allowed to be removed one hour per eight hour shift. He was also fitted with the AVI compression foot pumps bilaterally set at 80 mmHg. His calves have been nontender, free of any evidence of deep vein thrombosis. Heels were elevated off the bed using rolled towels. There has been no evidence of any deep venous thromboses. Negative Homans sign.   Patient has denied any chest pain or shortness of breath. Vital signs have been stable. He has been afebrile. Hemodynamically he was stable. No transfusions were given other than the Autovac transfusion the first six hours postoperatively.   Physical therapy was initiated on day one for gait training and transfers. Upon being discharged from the hospital he was ambulating greater than 200 feet. Was able go up and down four sets of steps. Was independent with bed to chair transfers. Occupational therapy was also initiated on day one for activities of daily living and assistive devices. He has progressed very nicely. Hospital course has been uneventful.   Patient's IV, Foley and Hemovac were all discontinued on day two along with a dressing change. The wound was free of any drainage or any signs of infection. The Polar Care was reapplied maintaining a temperature of 40 to 50 degrees Fahrenheit.   DISPOSITION: Patient is  discharged to home in improved stable condition.   DISCHARGE INSTRUCTIONS:  1. He may weight bear as tolerated. Continue using a walker until cleared by physical  therapy to go to a quad cane. Will receive home health physical therapy.  2. He is to continue wearing the TED stockings. These are to be worn during the day but may be removed at night.  3. Continue using Polar Care as much as he can around-the-clock for the next two weeks.  4. He has a follow-up appointment in two weeks.  5. He is placed on a regular diet.  6. He is to resume his regular medications that he was on prior to admission. He was given a prescription for oxycodone 5 to 10 mg every 4 to 6 hours p.r.n. for pain, Ultram 50 to 100 mg every 4 to 6 hours p.r.n. for pain and Lovenox 40 mg subcutaneously daily for 14 days, then discontinue and begin taking one 81 mg enteric-coated aspirin per day.    PAST MEDICAL HISTORY:  1. Hypertension.  2. Hypercholesterolemia.  3. Hypertriglyceridemia.  4. Gout. 5. Kidney stones.  6. History of cataracts bilateral eyes.  7. Cerebrovascular accident status post stroke in 1997. 8. Chronic obstructive pulmonary disease.  9. Pulmonary emboli on chronic anticoagulation. 10. Intermittent atrial flutter versus atrial tachycardia. 11. Sleep apnea and uses a CPAP.  ____________________________ Vance Peper, PA jrw:cms D: 11/18/2011 07:47:33 ET T: 11/18/2011 14:48:18 ET JOB#: [id number]  cc: Vance Peper, PA, <Dictator> Aileana Hodder PA ELECTRONICALLY SIGNED 11/18/2011 21:12

## 2014-11-04 LAB — SURGICAL PATHOLOGY

## 2014-11-10 NOTE — Op Note (Signed)
PATIENT NAME:  [name] Hardin, ROSSMAN MR#:  [id number] DATE OF BIRTH:  [birth year]  DATE OF PROCEDURE:  08/07/2014  PREOPERATIVE DIAGNOSIS: Bladder mass, bladder stone.   POSTOPERATIVE DIAGNOSIS: Bladder mass, bladder stone.   PROCEDURE PERFORMED: Transurethral resection of bladder tumor (small), cystolitholapaxy.   ATTENDING SURGEON: Sherlynn Stalls, MD.   ANESTHESIA: General anesthesia.   ESTIMATED BLOOD LOSS: 25 mL.   DRAINS: None.   COMPLICATIONS: None.   SPECIMENS: Bladder tumor, bladder stone.   INDICATION: This is an 81 year old male with gross hematuria who underwent hematuria workup, initially including CT urogram which revealed a bladder stone within the bladder as well as an anterior bladder wall mass approximately 1 cm in size concerning for possible bladder tumor. In lieu of proceeding with cystoscopy in the office we went ahead and booked him for  cystolitholapaxy in the OR and possible TURBT pending cystoscopic findings.  Risks and benefits of the procedure were explained in detail. The patient agreed to proceed as planned.   PROCEDURE: The patient was clearly identified in the preoperative holding area. Informed consent was confirmed. He was brought to the operating suite and placed on the table in the supine position. At this time universal timeout protocol was performed. All team members were identified. Venodyne boots were placed and he was administered 500 mg of IV Levaquin in the perioperative period. At this point in time a rigid cystoscope using a 67 French access sheath was advanced per urethra into the bladder. Of note the prostatic urethra was relatively friable, the patient had long prostatic length with bilobar coaptation and an elevated bladder neck. Upon inspection of the bladder it was mildly trabeculated and an approximately 1.5 cm bladder stone was encountered. Both the 30 degree and 70 degree lens were then used to inspect the entire surface of the bladder. An  approximately 1 cm spherical tumor was identified in a somewhat tricky location on the anterior left wall of the bladder, it appeared to be frondular with a broad base with some calcification on the surface. There were no remaining tumors within the entire bladder. At this point in time a 550 micron laser fiber was brought in using the settings of initially 1 joule and 10 Hz, increasing to 20 Hz. The bladder stone was fragmented into quite small pieces which eventually were able to be evacuated out of the bladder through the access sheath. Next in order to treat the tumor I swapped out the cystoscope for the Olympus bipolar resectoscope, however due to the penile shaft length as well as prostatic length and the location of the tumor I was unable to really reach the tumor with the resectoscope, I did take 1 swipe, but I was not able to subsequently reach this tumor even with significant suprapubic pressure.  I then replaced the scope with a 100 French rigid cystoscope and using the cold cup biopsy forceps with the bladder relatively empty I was able to piecewise remove the tumor with multiple cold cup biopsies. This required frequent bladder drainage and suprapubic pressure to reach and was quite difficult. I eventually was able to resect the tumor all the way down to the level of the muscle fibers. The pieces were evacuated out of the bladder and passed off as a bladder tumor. I then used the Bugbee electrocautery to cauterize the base of the tumor for hemostasis. At this time the procedure was deemed complete. The bladder was drained and the scope was removed. The patient was reversed from anesthesia  after being repositioned in the supine position. He was taken to the PACU in stable position.  There were no complications during this case.    ____________________________ Sherlynn Stalls, MD ajb:bu D: 08/07/2014 10:24:00 ET T: 08/07/2014 13:51:28 ET JOB#: [id number] Sherlynn Stalls MD ELECTRONICALLY SIGNED  08/14/2014 14:30

## 2014-11-10 NOTE — Op Note (Signed)
PATIENT NAME:  [name] Hardin, [name] Hardin MR#:  [id number] DATE OF BIRTH:  16-Mar-1932  DATE OF PROCEDURE:  08/07/2014  PREOPERATIVE DIAGNOSIS: Bladder mass, bladder stone.   POSTOPERATIVE DIAGNOSIS: Bladder mass, bladder stone.   PROCEDURE PERFORMED:  Transurethral resection of bladder tumor (small), cystolitholapaxy.   ATTENDING SURGEON: Sherlynn Stalls, MD.   ANESTHESIA: General anesthesia.   ESTIMATED BLOOD LOSS: 25 mL.   DRAINS: None.   COMPLICATIONS: None.   SPECIMENS: Bladder tumor, bladder stone.   INDICATION: This is an 81 year old male with gross hematuria who underwent hematuria workup, initially including CT urogram which revealed a bladder stone within the bladder as well as an anterior bladder wall mass approximately 1 cm in size concerning for possible bladder tumor. In lieu of proceeding with cystoscopy in the office we went ahead and booked him for  cystolitholapaxy in the OR and possible TURBT pending cystoscopic findings.  Risks and benefits of the procedure were explained in detail. The patient agreed to proceed as planned.   PROCEDURE: The patient was clearly identified in the preoperative holding area. Informed consent was confirmed. He was brought to the operating suite and placed on the table in the supine position. At this time universal timeout protocol was performed. All team members were identified. Venodyne boots were placed and he was administered 500 mg of IV Levaquin in the perioperative period. At this point in time a rigid cystoscope using a 1 French access sheath was advanced per urethra into the bladder. Of note the prostatic urethra was relatively friable, the patient had long prostatic length with bilobar coaptation and an elevated bladder neck. Upon inspection of the bladder it was mildly trabeculated and an approximately 1.5 cm bladder stone was encountered. Both the 30 degree and 70 degree lens were then used to inspect the entire surface of the bladder. An  approximately 1 cm spherical tumor was identified in a somewhat tricky location on the anterior left wall of the bladder, it appeared to be frondular with a broad base with some calcification on the surface. There were no remaining tumors within the entire bladder. At this point in time a 550 micron laser fiber was brought in using the settings of initially 1 joule and 10 Hz, increasing to 20 Hz. The bladder stone was fragmented into quite small pieces which eventually were able to be evacuated out of the bladder through the access sheath. Next in order to treat the tumor I swapped out the cystoscope for the Olympus bipolar resectoscope, however due to the penile shaft length as well as prostatic length and the location of the tumor I was unable to really reach the tumor with the resectoscope, I did take 1 swipe, but I was not able to subsequently reach this tumor even with significant suprapubic pressure.  I then replaced the scope with a 80 French rigid cystoscope and using the cold cup biopsy forceps with the bladder relatively empty I was able to piecewise remove the tumor with multiple cold cup biopsies. This required frequent bladder drainage and suprapubic pressure to reach and was quite difficult. I eventually was able to resect the tumor all the way down to the level of the muscle fibers. The pieces were evacuated out of the bladder and passed off as a bladder tumor. I then used the Bugbee electrocautery to cauterize the base of the tumor for hemostasis. At this time the procedure was deemed complete. The bladder was drained and the scope was removed. The patient was reversed from  anesthesia after being repositioned in the supine position. He was taken to the PACU in stable position.  There were no complications during this case.     ____________________________ Sherlynn Stalls, MD ajb:bu D: 08/07/2014 10:24:18 ET T: 08/07/2014 13:51:28 ET JOB#: [id number]  cc: Sherlynn Stalls, MD, <Dictator>

## 2015-01-17 DIAGNOSIS — Z9889 Other specified postprocedural states: Secondary | ICD-10-CM | POA: Insufficient documentation

## 2015-01-24 ENCOUNTER — Encounter: Payer: Self-pay | Admitting: Urology

## 2015-01-24 ENCOUNTER — Ambulatory Visit (INDEPENDENT_AMBULATORY_CARE_PROVIDER_SITE_OTHER): Payer: Medicare Other | Admitting: Urology

## 2015-01-24 VITALS — BP 145/67 | HR 80 | Resp 18 | Ht 71.0 in | Wt 309.8 lb

## 2015-01-24 DIAGNOSIS — R312 Other microscopic hematuria: Secondary | ICD-10-CM

## 2015-01-24 DIAGNOSIS — R3129 Other microscopic hematuria: Secondary | ICD-10-CM

## 2015-01-24 DIAGNOSIS — C673 Malignant neoplasm of anterior wall of bladder: Secondary | ICD-10-CM

## 2015-01-24 LAB — URINALYSIS, COMPLETE
Bilirubin, UA: NEGATIVE
Glucose, UA: NEGATIVE
Ketones, UA: NEGATIVE
Nitrite, UA: NEGATIVE
Protein, UA: NEGATIVE
RBC, UA: NEGATIVE
Specific Gravity, UA: 1.02 (ref 1.005–1.030)
UUROB: 1 mg/dL (ref 0.2–1.0)
pH, UA: 7 (ref 5.0–7.5)

## 2015-01-24 LAB — MICROSCOPIC EXAMINATION: BACTERIA UA: NONE SEEN

## 2015-01-24 MED ORDER — CIPROFLOXACIN HCL 500 MG PO TABS
500.0000 mg | ORAL_TABLET | Freq: Once | ORAL | Status: AC
  Administered 2015-01-24: 500 mg via ORAL

## 2015-01-24 MED ORDER — LIDOCAINE HCL 2 % EX GEL
1.0000 "application " | Freq: Once | CUTANEOUS | Status: AC
  Administered 2015-01-24: 1 via URETHRAL

## 2015-01-24 NOTE — Progress Notes (Signed)
01/24/2015 11:22 AM   [name] Hardin [birth year] [id number]  Referring provider: No referring provider defined for this encounter.  Chief Complaint  Patient presents with  . 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  He returns today for routine surveillance cystoscopy.  CT urogram on 07/02/2014 showed a 16 mm bladder stone as well as a 1 cm area on the anterior wall of the bladder suspicious for bladder carcinoma. He was taken to the operating room on 1/227/2016 for cystolitholapaxy and TURBT. At this time, the stone was completely fragmented and removed and as small 1 cm well-circumscribed frondular tumor was noted and the LEFT anterior wall of the bladder. 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  Most recently, he completed BCG x 6 (induction course)  Completed in 10/2014.  Smoking history of 3ppd x >8yrs. Quit 13 years ago.  No voiding complaints today.     PMH: Past Medical History  Diagnosis Date  . Stroke 1997  . COPD (chronic obstructive pulmonary disease)   . Hypertension   . Hypercholesterolemia   . Osteoarthritis   . Hyperglycemia   . Hx pulmonary embolism 08/2000    Surgical History: Past Surgical History  Procedure Laterality Date  . Tonsillectomy      Home Medications:    Medication List       This list is accurate as of: 01/24/15 11:59 PM.  Always use your most recent med list.               ADVAIR DISKUS 250-50 MCG/DOSE Aepb  Generic drug:  Fluticasone-Salmeterol  Inhale 1 puff into the lungs 2 (two) times daily.     allopurinol 300 MG tablet  Commonly known as:  ZYLOPRIM  Take 300 mg by mouth daily.     aspirin 81 MG tablet  Take 81 mg by mouth daily.     COLCRYS 0.6 MG tablet  Generic drug:  colchicine  Take 0.6 mg by mouth daily.     diltiazem 240 MG 24 hr capsule  Commonly  known as:  CARDIZEM CD  Take 240 mg by mouth daily.     furosemide 40 MG tablet  Commonly known as:  LASIX  Take 40 mg by mouth daily.     MULTI-DAY Tabs  Take by mouth.     potassium chloride 10 MEQ CR tablet  Commonly known as:  KLOR-CON  Take 10 mEq by mouth daily.     simvastatin 40 MG tablet  Commonly known as:  ZOCOR  Take 40 mg by mouth at bedtime.     SPIRIVA HANDIHALER 18 MCG inhalation capsule  Generic drug:  tiotropium  Place 18 mcg into inhaler and inhale daily.     vitamin B-12 100 MCG tablet  Commonly known as:  CYANOCOBALAMIN  Take 50 mcg by mouth every 30 (thirty) days.     warfarin 5 MG tablet  Commonly known as:  COUMADIN  Take 5 mg by mouth as directed.        Allergies: No Known Allergies  Family History: Family History  Problem Relation Age of Onset  . Prostate cancer Neg Hx   . Kidney cancer Neg Hx     Social History:  reports that he has quit smoking. He does not have any smokeless tobacco history on file. His alcohol and drug histories  are not on file.   Physical Exam: BP 145/67 mmHg  Pulse 80  Resp 18  Ht 5\' 11"  (1.803 m)  Wt 309 lb 12.8 oz (140.524 kg)  BMI 43.23 kg/m2  Constitutional:  Alert and oriented, No acute distress. HEENT: [place] AT, moist mucus membranes.  Trachea midline, no masses. Cardiovascular: No clubbing, cyanosis, or edema. Respiratory: Normal respiratory effort, no increased work of breathing. GI: Abdomen is soft, nontender, nondistended, no abdominal masses GU: No CVA tenderness. Normal phallus, orthotopic meatus. Skin: No rashes, bruises or suspicious lesions. Neurologic: Grossly intact, no focal deficits, moving all 4 extremities. Psychiatric: Normal mood and affect.  Laboratory Data: Lab Results  Component Value Date   WBC 8.8 08/07/2014   HGB 14.7 08/07/2014   HCT 44.8 08/07/2014   MCV 97 08/07/2014   PLT 152 08/07/2014    Lab Results  Component Value Date   CREATININE 1.44* 08/07/2014     Urinalysis Results for orders placed or performed in visit on 01/24/15  Microscopic Examination  Result Value Ref Range   WBC, UA 0-5 0 -  5 /hpf   RBC, UA 3-10 (A) 0 -  2 /hpf   Epithelial Cells (non renal) 0-10 0 - 10 /hpf   Bacteria, UA None seen None seen/Few  Urinalysis, Complete  Result Value Ref Range   Specific Gravity, UA 1.020 1.005 - 1.030   pH, UA 7.0 5.0 - 7.5   Color, UA Yellow Yellow   Appearance Ur Clear Clear   Leukocytes, UA Trace (A) Negative   Protein, UA Negative Negative/Trace   Glucose, UA Negative Negative   Ketones, UA Negative Negative   RBC, UA Negative Negative   Bilirubin, UA Negative Negative   Urobilinogen, Ur 1.0 0.2 - 1.0 mg/dL   Nitrite, UA Negative Negative   Microscopic Examination See below:      Cystoscopy Procedure Note  Patient identification was confirmed, informed consent was obtained, and patient was prepped using Betadine solution.  Lidocaine jelly was administered per urethral meatus.    Preoperative abx where received prior to procedure.     Pre-Procedure: - Inspection reveals a normal caliber ureteral meatus.  Procedure: The flexible cystoscope was introduced without difficulty - No urethral strictures/lesions are present. 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 S/p negative surveillance cystoscopy  1. Malignant neoplasm of anterior wall of urinary bladder Recommend every 3 monthly cysto dose per surveillance protocol. No evidence of recurrence today. - Urinalysis, Complete - lidocaine (XYLOCAINE) 2 % jelly 1 application; Place 1 application into the urethra once. - ciprofloxacin  (CIPRO) tablet 500 mg; Take 1 tablet (500 mg total) by mouth once.  2. Microscopic hematuria    Return in about 3 months (around 04/26/2015) for cystoscopy.  Hollice Espy, MD  [facility] [address], [place], [place] [address] [phone]

## 2015-02-04 ENCOUNTER — Encounter: Payer: Self-pay | Admitting: *Deleted

## 2015-02-04 ENCOUNTER — Ambulatory Visit
Admission: RE | Admit: 2015-02-04 | Discharge: 2015-02-04 | Disposition: A | Discharge status: Home / Self Care | Payer: Medicare Other | Source: Ambulatory Visit | Attending: Vascular Surgery | Admitting: Vascular Surgery

## 2015-02-04 ENCOUNTER — Encounter
Admission: RE | Disposition: A | Discharge status: Home / Self Care | Payer: Self-pay | Source: Ambulatory Visit | Attending: Vascular Surgery

## 2015-02-04 DIAGNOSIS — Z79899 Other long term (current) drug therapy: Secondary | ICD-10-CM | POA: Insufficient documentation

## 2015-02-04 DIAGNOSIS — I10 Essential (primary) hypertension: Secondary | ICD-10-CM | POA: Insufficient documentation

## 2015-02-04 DIAGNOSIS — M179 Osteoarthritis of knee, unspecified: Secondary | ICD-10-CM | POA: Insufficient documentation

## 2015-02-04 DIAGNOSIS — J439 Emphysema, unspecified: Secondary | ICD-10-CM | POA: Diagnosis not present

## 2015-02-04 DIAGNOSIS — Z7982 Long term (current) use of aspirin: Secondary | ICD-10-CM | POA: Insufficient documentation

## 2015-02-04 DIAGNOSIS — M1990 Unspecified osteoarthritis, unspecified site: Secondary | ICD-10-CM | POA: Insufficient documentation

## 2015-02-04 DIAGNOSIS — Z86711 Personal history of pulmonary embolism: Secondary | ICD-10-CM | POA: Diagnosis present

## 2015-02-04 DIAGNOSIS — C679 Malignant neoplasm of bladder, unspecified: Secondary | ICD-10-CM | POA: Diagnosis not present

## 2015-02-04 DIAGNOSIS — I2699 Other pulmonary embolism without acute cor pulmonale: Secondary | ICD-10-CM

## 2015-02-04 DIAGNOSIS — Z86718 Personal history of other venous thrombosis and embolism: Secondary | ICD-10-CM | POA: Diagnosis present

## 2015-02-04 DIAGNOSIS — I499 Cardiac arrhythmia, unspecified: Secondary | ICD-10-CM | POA: Insufficient documentation

## 2015-02-04 DIAGNOSIS — Z7901 Long term (current) use of anticoagulants: Secondary | ICD-10-CM | POA: Insufficient documentation

## 2015-02-04 HISTORY — PX: PERIPHERAL VASCULAR CATHETERIZATION: SHX172C

## 2015-02-04 LAB — PROTIME-INR
INR: 1.39
Prothrombin Time: 17.3 seconds — ABNORMAL HIGH (ref 11.4–15.0)

## 2015-02-04 SURGERY — IVC FILTER REMOVAL
Anesthesia: Moderate Sedation

## 2015-02-04 MED ORDER — MIDAZOLAM HCL 2 MG/2ML IJ SOLN
INTRAMUSCULAR | Status: DC | PRN
  Administered 2015-02-04: 3 mg via INTRAVENOUS
  Administered 2015-02-04: 1 mg via INTRAVENOUS

## 2015-02-04 MED ORDER — MIDAZOLAM HCL 2 MG/2ML IJ SOLN
INTRAMUSCULAR | Status: AC
Start: 2015-02-04 — End: 2015-02-04
  Filled 2015-02-04: qty 2

## 2015-02-04 MED ORDER — SODIUM CHLORIDE 0.9 % IV SOLN
INTRAVENOUS | Status: DC
  Administered 2015-02-04: 14:00:00 via INTRAVENOUS

## 2015-02-04 MED ORDER — MIDAZOLAM HCL 2 MG/2ML IJ SOLN
INTRAMUSCULAR | Status: AC
  Filled 2015-02-04: qty 2

## 2015-02-04 MED ORDER — HEPARIN (PORCINE) IN NACL 2-0.9 UNIT/ML-% IJ SOLN
INTRAMUSCULAR | Status: AC
  Filled 2015-02-04: qty 500

## 2015-02-04 MED ORDER — LIDOCAINE-EPINEPHRINE (PF) 1 %-1:200000 IJ SOLN
INTRAMUSCULAR | Status: AC
  Filled 2015-02-04: qty 30

## 2015-02-04 MED ORDER — CEFAZOLIN SODIUM 1-5 GM-% IV SOLN: 1.0000 g | Freq: Once | INTRAVENOUS | Status: DC

## 2015-02-04 SURGICAL SUPPLY — 6 items
CATH KUMPE (CATHETERS) ×2
CATH SLIP 5FR 0.38 X 40 KMP (CATHETERS) ×1 IMPLANT
PACK ANGIOGRAPHY (CUSTOM PROCEDURE TRAY) ×3 IMPLANT
SET VENACAVA FILTER RETRIEVAL (MISCELLANEOUS) ×3 IMPLANT
TOWEL OR 17X26 4PK STRL BLUE (TOWEL DISPOSABLE) ×3 IMPLANT
WIRE J 3MM .035X145CM (WIRE) ×3 IMPLANT

## 2015-02-04 NOTE — H&P (Signed)
[name] History & Physical Update  The patient was interviewed and re-examined.  The patient's previous History and Physical has been reviewed and is unchanged.  There is no change in the plan of care. We plan to proceed with the scheduled procedure.  Jamilah Jean, Dolores Lory, MD  02/04/2015, 4:14 PM

## 2015-02-04 NOTE — Op Note (Signed)
  OPERATIVE NOTE   PRE-OPERATIVE DIAGNOSIS: DVT with history of PE  POST-OPERATIVE DIAGNOSIS: DVT with history of PE  PROCEDURE: 1. Retrieval of IVC Filter 2. Inferior Vena Cavagram  SURGEON: Katha Cabal, M.D.  ANESTHESIA:  Conscious Sedation  ESTIMATED BLOOD LOSS: Minimal cc  FINDING(S):inferior vena cava is widely patent filter is in place in good position. Filter is removed without incident  SPECIMEN(S):  IVC filter intact  INDICATIONS:   [name] Hardin is a 81 y.o. male who presents with history of placement of IVC filter he has done well with anticoagulation wishes to have his filter removed. The risks and benefits of been reviewed all questions answered patient agrees to proceed.  DESCRIPTION: After obtaining full informed written consent, the patient was brought back to the Special Procedure Suite and placed in the supine position.  The patient received IV antibiotics prior to induction.  After obtaining adequate sedation, the patient was prepped and draped in the standard fashion and appropriate time out is called.     Ultrasound was placed in a sterile sleeve.The right neck was then imaged with ultrasound.   Jugular vein was identified it is echolucent and homogeneous indicating patency. 1% lidocaine is then infiltrated under ultrasound visualization and subsequently a Seldinger needle is inserted under real-time ultrasound guidance.  J-wire is then advanced into the inferior vena cava under fluoroscopic guidance. With the tip of the sheath at the confluence of the iliac veins inferior vena caval imaging is performed.  After review of the image the sheath is repositioned to above the filter and the snares introduced. Snares opened and the hook is secured without difficulty. The filter is then collapsed within the sheath and removed without difficulty.  Sheath is removed by pressures held the patient tolerated the procedure well and there were no immediate  complications.  Interpretation: inferior vena cava is widely patent filter is in place in good position. Filter is removed without incident.     COMPLICATIONS: None  CONDITION: [name], M.D. [facility] Office: [phone]   02/04/2015, 5:07 PM

## 2015-02-05 ENCOUNTER — Encounter: Payer: Self-pay | Admitting: Vascular Surgery

## 2015-02-24 ENCOUNTER — Other Ambulatory Visit: Payer: Self-pay | Admitting: Urology

## 2015-03-19 ENCOUNTER — Other Ambulatory Visit: Payer: Self-pay | Admitting: Urology

## 2015-05-02 ENCOUNTER — Ambulatory Visit (INDEPENDENT_AMBULATORY_CARE_PROVIDER_SITE_OTHER): Payer: Medicare Other | Admitting: Urology

## 2015-05-02 ENCOUNTER — Encounter: Payer: Self-pay | Admitting: Urology

## 2015-05-02 VITALS — BP 147/80 | HR 92 | Ht 71.0 in | Wt 304.2 lb

## 2015-05-02 DIAGNOSIS — C679 Malignant neoplasm of bladder, unspecified: Secondary | ICD-10-CM | POA: Diagnosis not present

## 2015-05-02 LAB — MICROSCOPIC EXAMINATION
Bacteria, UA: NONE SEEN
EPITHELIAL CELLS (NON RENAL): NONE SEEN /HPF (ref 0–10)
Renal Epithel, UA: NONE SEEN /hpf
WBC UA: NONE SEEN /HPF (ref 0–?)

## 2015-05-02 LAB — URINALYSIS, COMPLETE
BILIRUBIN UA: NEGATIVE
GLUCOSE, UA: NEGATIVE
KETONES UA: NEGATIVE
Leukocytes, UA: NEGATIVE
NITRITE UA: NEGATIVE
Protein, UA: NEGATIVE
Specific Gravity, UA: 1.02 (ref 1.005–1.030)
UUROB: 0.2 mg/dL (ref 0.2–1.0)
pH, UA: 6.5 (ref 5.0–7.5)

## 2015-05-02 MED ORDER — CIPROFLOXACIN HCL 500 MG PO TABS
500.0000 mg | ORAL_TABLET | Freq: Once | ORAL | Status: AC
  Administered 2015-05-02: 500 mg via ORAL

## 2015-05-02 MED ORDER — LIDOCAINE HCL 2 % EX GEL
1.0000 "application " | Freq: Once | CUTANEOUS | Status: AC
  Administered 2015-05-02: 1 via URETHRAL

## 2015-05-02 NOTE — Progress Notes (Signed)
High-grade bladder cancer superficial Ta. Patient was a previous smoker 3 pack a day smoker in the 90s quit nearly 2000. Home he had a bladder stone also time of his high-grade TCC of the bladder resection. The calculus was removed. He follows up for 6 months cystoscopy today after a course of BCG for his high-grade superficial TCC. Cystoscopy today as much improved from his previous cystoscopy at the time of his tumor. There are no tumors masses or growths in his bladder. His prostate is only slightly enlarged with lateral lobe hypertrophy. There is no trabeculation tumor mass or growth or stone in his bladder today. He'll have another follow-up cystoscopy in 3 months. I explained at length to him the necessity for lifetime of cystoscopy considering his tumor. He is a grossly obese patient really not taking very good care of he's had a CVA home hyperglycemia pulmonary embolus but denies diabetes. Follow-up 3 months for surveillance cystoscopy

## 2015-05-02 NOTE — Progress Notes (Signed)
    Cystoscopy Procedure Note  Patient identification was confirmed, informed consent was obtained, and patient was prepped using Betadine solution.  Lidocaine jelly was administered per urethral meatus.    Preoperative abx where received prior to procedure.     Pre-Procedure: - Inspection reveals a normal caliber ureteral meatus.  Procedure: The flexible cystoscope was introduced without difficulty - No urethral strictures/lesions are present. -  prostate lateral lobe enlargement -  bladder neck - Bilateral ureteral orifices identified - Bladder mucosa  reveals no ulcers, tumors, or lesions - No bladder stones - No trabeculation  Retroflexion shows no tumor   Post-Procedure: - Patient tolerated the procedure well

## 2015-08-04 ENCOUNTER — Other Ambulatory Visit: Payer: Medicare Other

## 2015-08-18 ENCOUNTER — Ambulatory Visit (INDEPENDENT_AMBULATORY_CARE_PROVIDER_SITE_OTHER): Payer: Medicare Other | Admitting: Urology

## 2015-08-18 ENCOUNTER — Encounter: Payer: Self-pay | Admitting: Urology

## 2015-08-18 VITALS — BP 136/70 | HR 92 | Ht 71.0 in | Wt 302.9 lb

## 2015-08-18 DIAGNOSIS — C679 Malignant neoplasm of bladder, unspecified: Secondary | ICD-10-CM

## 2015-08-18 LAB — URINALYSIS, COMPLETE
Bilirubin, UA: NEGATIVE
Glucose, UA: NEGATIVE
Leukocytes, UA: NEGATIVE
NITRITE UA: NEGATIVE
PH UA: 5.5 (ref 5.0–7.5)
RBC UA: NEGATIVE
Specific Gravity, UA: 1.025 (ref 1.005–1.030)
UUROB: 1 mg/dL (ref 0.2–1.0)

## 2015-08-18 LAB — MICROSCOPIC EXAMINATION

## 2015-08-18 MED ORDER — CIPROFLOXACIN HCL 500 MG PO TABS
500.0000 mg | ORAL_TABLET | Freq: Once | ORAL | Status: AC
  Administered 2015-08-18: 500 mg via ORAL

## 2015-08-18 MED ORDER — LIDOCAINE HCL 2 % EX GEL
1.0000 "application " | Freq: Once | CUTANEOUS | Status: AC
  Administered 2015-08-18: 1 via URETHRAL

## 2015-08-18 NOTE — Progress Notes (Signed)
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 He returns today for routine surveillance cystoscopy.  CT urogram on 07/02/2014 showed a 16 mm bladder stone as well as a 1 cm area on the anterior wall of the bladder suspicious for bladder carcinoma. He was taken to the operating room on 1/227/2016 for cystolitholapaxy and TURBT. At this time, the stone was completely fragmented and removed and as small 1 cm well-circumscribed frondular tumor was noted and the LEFT anterior wall of the bladder. 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   He completed BCG x 6 (induction course)- Completed in 10/2014.  Smoking history of 3ppd x >47yrs. Quit 13 years ago.  Today, he is well without complaints. He has no voiding symptoms. He has had no gross hematuria, dysuria or flank pain.  UA showed a few bacteria and white cells but no red cells. Clinically I don't think he is infected. Urine was sent for culture and he was covered with Cipro.  Cystoscopy -  a timeout was performed and the patient was prepped and draped in the usual sterile fashion. The flexible cystoscope was passed per urethra and the bladder examined. The scope was withdrawn. Findings: The urethra appeared normal, the prostatic urethra appeared normal. The trigone and ureteral orifices were in their normal orthotopic position with clear efflux. The bladder mucosa appeared normal. There were no stones or foreign bodies in the bladder.  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 No evidence of disease. Plan cystoscopy in 3 months.

## 2015-08-20 LAB — CULTURE, URINE COMPREHENSIVE

## 2015-09-03 ENCOUNTER — Telehealth: Payer: Self-pay | Admitting: Urology

## 2015-09-08 NOTE — Telephone Encounter (Signed)
Opened in error

## 2015-11-19 ENCOUNTER — Encounter: Payer: Self-pay | Admitting: Urology

## 2015-11-19 ENCOUNTER — Ambulatory Visit (INDEPENDENT_AMBULATORY_CARE_PROVIDER_SITE_OTHER): Payer: Medicare Other | Admitting: Urology

## 2015-11-19 VITALS — BP 135/71 | HR 85 | Ht 71.0 in | Wt 305.4 lb

## 2015-11-19 DIAGNOSIS — Z8551 Personal history of malignant neoplasm of bladder: Secondary | ICD-10-CM

## 2015-11-19 DIAGNOSIS — C679 Malignant neoplasm of bladder, unspecified: Secondary | ICD-10-CM | POA: Diagnosis not present

## 2015-11-19 LAB — URINALYSIS, COMPLETE
BILIRUBIN UA: NEGATIVE
Glucose, UA: NEGATIVE
KETONES UA: NEGATIVE
LEUKOCYTES UA: NEGATIVE
Nitrite, UA: NEGATIVE
PH UA: 6 (ref 5.0–7.5)
RBC UA: NEGATIVE
SPEC GRAV UA: 1.02 (ref 1.005–1.030)
Urobilinogen, Ur: 0.2 mg/dL (ref 0.2–1.0)

## 2015-11-19 LAB — MICROSCOPIC EXAMINATION
Bacteria, UA: NONE SEEN
Epithelial Cells (non renal): NONE SEEN /hpf (ref 0–10)
RBC, UA: NONE SEEN /hpf (ref 0–?)

## 2015-11-19 MED ORDER — LIDOCAINE HCL 2 % EX GEL
1.0000 "application " | Freq: Once | CUTANEOUS | Status: AC
  Administered 2015-11-19: 1 via URETHRAL

## 2015-11-19 MED ORDER — CIPROFLOXACIN HCL 500 MG PO TABS
500.0000 mg | ORAL_TABLET | Freq: Once | ORAL | Status: AC
  Administered 2015-11-19: 500 mg via ORAL

## 2015-11-19 NOTE — Progress Notes (Signed)
9:13 AM  11/19/2015   [name] Hardin 1931/08/22 [id number]  Referring provider: [name], MD [place], [place] [address]  Chief Complaint  Patient presents with  . 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  He returns today for routine surveillance cystoscopy.  CT urogram on 07/02/2014 showed a 16 mm bladder stone as well as a 1 cm area on the anterior wall of the bladder suspicious for bladder carcinoma. He was taken to the operating room on 1/227/2016 for cystolitholapaxy and TURBT. At this time, the stone was completely fragmented and removed and as small 1 cm well-circumscribed frondular tumor was noted and the LEFT anterior wall of the bladder. 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   He completed BCG x 6 (induction course) on 10/2014.  Smoking history of 3ppd x >75yr. Quit 13 years ago.  No voiding complaints today.     PMH: Past Medical History  Diagnosis Date  . St[place] ([place]  . COPD (chronic obstructive pulmonary disease) ([place]   . Hypertension   . Hypercholesterolemia   . Osteoarthritis   . Hyperglycemia   . Hx pulmonary embolism 08/2000  . ATRIAL FIBRILLATION 03/05/2010    Qualifier: Diagnosis of  By: [name], FMartyn Malay  . Benign hypertension 12/16/2013  . Pulmonary embolism ([facility] 12/16/2013  . PREMATURE VENTRICULAR CONTRACTIONS 03/05/2010    Qualifier: Diagnosis of  By: [name], FMartyn Malay  . Chronic obstructive pulmonary disease ([facility] 12/16/2013  . Diabetes mellitus, type 2 ([place] 12/16/2013    Last Assessment & Plan:  The patient reports good glucose control. No frequent hypoglycemia is noted. Tolerating hypoglycemic medications without significant difficulty. No nausea     Surgical History: Past Surgical  History  Procedure Laterality Date  . Tonsillectomy    . Peripheral vascular catheterization N/A 02/04/2015    Procedure: IVC Filter Removal;  Surgeon: GKatha Cabal MD;  [facility];  Service: Cardiovascular;  Laterality: N/A;    Home Medications:    Medication List       This list is accurate as of: 11/19/15  9:13 AM.  Always use your most recent med list.               ADVAIR DISKUS 250-50 MCG/DOSE Aepb  Generic drug:  Fluticasone-Salmeterol  Inhale 1 puff into the lungs 2 (two) times daily.     allopurinol 300 MG tablet  Commonly known as:  ZYLOPRIM  Take 300 mg by mouth daily.     amoxicillin 500 MG capsule  Commonly known as:  AMOXIL     aspirin 81 MG tablet  Take 81 mg by mouth daily.     COLCRYS 0.6 MG tablet  Generic drug:  colchicine  Take 0.6 mg by mouth daily.     diltiazem 240 MG 24 hr capsule  Commonly known as:  CARDIZEM CD  Take 240 mg by mouth daily.     furosemide 40 MG tablet  Commonly known as:  LASIX  Take 40 mg by mouth daily.     furosemide 20 MG tablet  Commonly known as:  LASIX     MULTI-DAY Tabs  Take by mouth.     potassium chloride 10 MEQ CR  tablet  Commonly known as:  KLOR-CON  Take 10 mEq by mouth daily.     simvastatin 40 MG tablet  Commonly known as:  ZOCOR  Take 40 mg by mouth at bedtime.     SPIRIVA HANDIHALER 18 MCG inhalation capsule  Generic drug:  tiotropium  Place 18 mcg into inhaler and inhale daily.     vitamin B-12 100 MCG tablet  Commonly known as:  CYANOCOBALAMIN  Take 50 mcg by mouth every 30 (thirty) days.     warfarin 5 MG tablet  Commonly known as:  COUMADIN  Take 5 mg by mouth as directed.        Allergies: No Known Allergies  Family History: Family History  Problem Relation Age of Onset  . Prostate cancer Neg Hx   . Kidney cancer Neg Hx     Social History:  reports that he has quit smoking. He does not have any smokeless tobacco history on file. His alcohol and  drug histories are not on file.   Physical Exam: BP 135/71 mmHg  Pulse 85  Ht _0  (1.803 m)  Wt 305 lb 6.4 oz (138.529 kg)  BMI 42.61 kg/m2  Constitutional:  Alert and oriented, No acute distress. HEENT: [place] AT, moist mucus membranes.  Trachea midline, no masses. Cardiovascular: No clubbing, cyanosis, or edema. Respiratory: Normal respiratory effort, no increased work of breathing. GI: Abdomen is soft, nontender, nondistended, no abdominal masses GU: No CVA tenderness. Normal phallus, orthotopic meatus. Skin: No rashes, bruises or suspicious lesions. Neurologic: Grossly intact, no focal deficits, moving all 4 extremities. Psychiatric: Normal mood and affect.  Laboratory Data: Basic Metabolic Panel (BMP) (11/94/1740 11:24 AM) Basic Metabolic Panel (BMP) (81/44/8185 11:24 AM)  Component Value Ref Range  Glucose 91 70 - 110 mg/dL  Sodium 142 136 - 145 mmol/L  Potassium 4.2 3.6 - 5.1 mmol/L  Chloride 107 97 - 109 mmol/L  Carbon Dioxide (CO2) 28.8 22.0 - 32.0 mmol/L  Calcium 9.2 8.7 - 10.3 mg/dL  Urea Nitrogen (BUN) 18 7 - 25 mg/dL  Creatinine 1.1 0.7 - 1.3 mg/dL  Glomerular Filtration Rate (eGFR), MDRD Estimate 64 >60 mL/min/1.73sq m  BUN/Crea Ratio 16.4 6.0 - 20.0  Anion Gap w/K 10.4 6.0 - 16.0    Urinalysis See epic.  Unremarkable.  Cystoscopy Procedure Note  Patient identification was confirmed, informed consent was obtained, and patient was prepped using Betadine solution.  Lidocaine jelly was administered per urethral meatus.    Preoperative abx where received prior to procedure.     Pre-Procedure: - Inspection reveals a normal caliber ureteral meatus.  Procedure: The flexible cystoscope was introduced without difficulty - No urethral strictures/lesions are present. - Enlarged prostate with bilobar coapation - Elevated bladder neck - Bilateral ureteral orifices identified - Bladder mucosa  reveals no ulcers, tumors, or lesions - No bladder stones - Mild  trabeculation  Retroflexion shows no obvious tumor   Post-Procedure: - Patient tolerated the procedure well    Assessment & Plan:  History of HgTa TCC 08/02/14 s/p TURBT, BCG x 6. S/p negative surveillance cystoscopy  1. Malignant neoplasm of anterior wall of urinary bladder/ history of bladder cancer Recommend every 3 monthly cysto dose per surveillance protocol. No evidence of recurrence today. - Urinalysis, Complete - lidocaine (XYLOCAINE) 2 % jelly 1 application; Place 1 application into the urethra once. - ciprofloxacin (CIPRO) tablet 500 mg; Take 1 tablet (500 mg total) by mouth once.   Return in about 3 months (around 02/19/2016) for cysto.  Hollice Espy, MD  [facility] [address] [place], [place] [address] [phone]

## 2016-02-20 ENCOUNTER — Ambulatory Visit (INDEPENDENT_AMBULATORY_CARE_PROVIDER_SITE_OTHER): Payer: Medicare Other | Admitting: Urology

## 2016-02-20 ENCOUNTER — Encounter: Payer: Self-pay | Admitting: Urology

## 2016-02-20 VITALS — Ht 71.0 in | Wt 303.0 lb

## 2016-02-20 DIAGNOSIS — Z8551 Personal history of malignant neoplasm of bladder: Secondary | ICD-10-CM | POA: Diagnosis not present

## 2016-02-20 LAB — MICROSCOPIC EXAMINATION: BACTERIA UA: NONE SEEN

## 2016-02-20 LAB — URINALYSIS, COMPLETE
BILIRUBIN UA: NEGATIVE
GLUCOSE, UA: NEGATIVE
KETONES UA: NEGATIVE
Leukocytes, UA: NEGATIVE
NITRITE UA: NEGATIVE
Protein, UA: NEGATIVE
SPEC GRAV UA: 1.025 (ref 1.005–1.030)
Urobilinogen, Ur: 1 mg/dL (ref 0.2–1.0)
pH, UA: 5 (ref 5.0–7.5)

## 2016-02-20 MED ORDER — CIPROFLOXACIN HCL 500 MG PO TABS
500.0000 mg | ORAL_TABLET | Freq: Once | ORAL | Status: AC
  Administered 2016-02-20: 500 mg via ORAL

## 2016-02-20 MED ORDER — LIDOCAINE HCL 2 % EX GEL
1.0000 "application " | Freq: Once | CUTANEOUS | Status: AC
  Administered 2016-02-20: 1 via URETHRAL

## 2016-02-20 NOTE — Progress Notes (Signed)
10:44 AM  02/20/16   [name] Hardin 02-09-1932 [id number]  Referring provider: [name], MD [name], [name] [address]  Chief Complaint  Patient presents with  . 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  He returns today for routine surveillance cystoscopy.  CT urogram on 07/02/2014 showed a 16 mm bladder stone as well as a 1 cm area on the anterior wall of the bladder suspicious for bladder carcinoma. He was taken to the operating room on 1/227/2016 for cystolitholapaxy and TURBT. At this time, the stone was completely fragmented and removed and as small 1 cm well-circumscribed frondular tumor was noted and the LEFT anterior wall of the bladder. 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   He completed BCG x 6 (induction course) on 10/2014.  Smoking history of 3ppd x >15yr. Quit 13 years ago.  No voiding complaints today.    Last cystoscopy 11/2015 negative for recurrence.   PMH: Past Medical History:  Diagnosis Date  . ATRIAL FIBRILLATION 03/05/2010   Qualifier: Diagnosis of  By: [name], FMartyn Malay  . Benign hypertension 12/16/2013  . Chronic obstructive pulmonary disease ([facility] 12/16/2013  . COPD (chronic obstructive pulmonary disease) ([facility]   . Diabetes mellitus, type 2 ([place] 12/16/2013   Last Assessment & Plan:  The patient reports good glucose control. No frequent hypoglycemia is noted. Tolerating hypoglycemic medications without significant difficulty. No nausea   . Hx pulmonary embolism 08/2000  . Hypercholesterolemia   . Hyperglycemia   . Hypertension   . Osteoarthritis   . PREMATURE VENTRICULAR CONTRACTIONS 03/05/2010   Qualifier: Diagnosis of  By: [name], FMartyn Malay  . Pulmonary embolism ([facility] 12/16/2013  . Stroke ([facility]     Surgical History: Past Surgical History:  Procedure Laterality Date  . PERIPHERAL VASCULAR CATHETERIZATION N/A 02/04/2015   Procedure: IVC Filter Removal;  Surgeon: GKatha Cabal MD;  [facility];  Service: Cardiovascular;  Laterality: N/A;  . TONSILLECTOMY      Home Medications:    Medication List       Accurate as of 02/20/16 10:44 AM. Always use your most recent med list.          ADVAIR DISKUS 250-50 MCG/DOSE Aepb Generic drug:  Fluticasone-Salmeterol Inhale 1 puff into the lungs 2 (two) times daily.   allopurinol 300 MG tablet Commonly known as:  ZYLOPRIM Take 300 mg by mouth daily.   aspirin 81 MG tablet Take 81 mg by mouth daily.   COLCRYS 0.6 MG tablet Generic drug:  colchicine Take 0.6 mg by mouth daily.   diltiazem 240 MG 24 hr capsule Commonly known as:  CARDIZEM CD Take 240 mg by mouth daily.   furosemide 20 MG tablet Commonly known as:  LASIX   MULTI-DAY Tabs Take by mouth.   potassium chloride 10 MEQ CR tablet Commonly known as:  KLOR-CON Take 10 mEq by mouth daily.   simvastatin 40 MG tablet Commonly known as:  ZOCOR Take 40 mg by mouth at bedtime.   SPIRIVA HANDIHALER 18 MCG inhalation capsule Generic drug:  tiotropium Place 18 mcg into inhaler and inhale daily.   vitamin B-12 100 MCG tablet Commonly known as:  CYANOCOBALAMIN Take 50 mcg by mouth every 30 (thirty) days.  warfarin 5 MG tablet Commonly known as:  COUMADIN Take 5 mg by mouth as directed.       Allergies: No Known Allergies  Family History: Family History  Problem Relation Age of Onset  . Prostate cancer Neg Hx   . Kidney cancer Neg Hx     Social History:  reports that he has quit smoking. He has never used smokeless tobacco. His alcohol and drug histories are not on file.   Physical Exam: Ht 5' 11"  (1.803 m)   Wt (!) 303 lb (137.4 kg)   BMI 42.26 kg/m   Constitutional:  Alert and oriented, No acute distress. HEENT: [place] AT,  moist mucus membranes.  Trachea midline, no masses. Cardiovascular: No clubbing, cyanosis, or edema. Respiratory: Normal respiratory effort, no increased work of breathing. GI: Abdomen is soft, nontender, nondistended, no abdominal masses GU: No CVA tenderness. Normal phallus, orthotopic meatus. Skin: No rashes, bruises or suspicious lesions. Neurologic: Grossly intact, no focal deficits, moving all 4 extremities. Psychiatric: Normal mood and affect.  Laboratory Data: Basic Metabolic Panel (BMP) (68/15/9470 11:24 AM) Basic Metabolic Panel (BMP) (76/15/1834 11:24 AM)  Component Value Ref Range  Glucose 91 70 - 110 mg/dL  Sodium 142 136 - 145 mmol/L  Potassium 4.2 3.6 - 5.1 mmol/L  Chloride 107 97 - 109 mmol/L  Carbon Dioxide (CO2) 28.8 22.0 - 32.0 mmol/L  Calcium 9.2 8.7 - 10.3 mg/dL  Urea Nitrogen (BUN) 18 7 - 25 mg/dL  Creatinine 1.1 0.7 - 1.3 mg/dL  Glomerular Filtration Rate (eGFR), MDRD Estimate 64 >60 mL/min/1.73sq m  BUN/Crea Ratio 16.4 6.0 - 20.0  Anion Gap w/K 10.4 6.0 - 16.0    Urinalysis See epic.  Unremarkable.  Cystoscopy Procedure Note  Patient identification was confirmed, informed consent was obtained, and patient was prepped using Betadine solution.  Lidocaine jelly was administered per urethral meatus.    Preoperative abx where received prior to procedure.     Pre-Procedure: - Inspection reveals a normal caliber ureteral meatus.  Procedure: The flexible cystoscope was introduced without difficulty - No urethral strictures/lesions are present. - Enlarged prostate with bilobar coapation - Elevated bladder neck - Bilateral ureteral orifices identified - Bladder mucosa  reveals no ulcers, tumors, or lesions.  Mild subtle erythema diffusely on the dome of the bladder. Nonspecific and not particularly suspicious. 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 S/p negative surveillance cystoscopy  1. Malignant neoplasm of anterior wall of urinary bladder/ history of bladder cancer Recommend every 3 monthly cysto dose per surveillance protocol. No evidence of recurrence today. We'll send off urine cytology today and setting of mild erythema although unlikely to represent malignancy or recurrence. We'll reassess her next cystoscopy or if urine cytology comes back suspicious or positive.  - Urinalysis, Complete - lidocaine (XYLOCAINE) 2 % jelly 1 application; Place 1 application into the urethra once. - ciprofloxacin (CIPRO) tablet 500 mg; Take 1 tablet (500 mg total) by mouth once.   Return for cystoscopy.  Hollice Espy, MD  [facility] [address], [place], [place] [address] [phone]

## 2016-03-05 ENCOUNTER — Other Ambulatory Visit: Payer: Self-pay | Admitting: Urology

## 2016-05-26 ENCOUNTER — Ambulatory Visit (INDEPENDENT_AMBULATORY_CARE_PROVIDER_SITE_OTHER): Payer: Medicare Other | Admitting: Urology

## 2016-05-26 VITALS — BP 147/72 | HR 106 | Ht 71.0 in | Wt 304.0 lb

## 2016-05-26 DIAGNOSIS — Z8551 Personal history of malignant neoplasm of bladder: Secondary | ICD-10-CM

## 2016-05-26 LAB — URINALYSIS, COMPLETE
Bilirubin, UA: NEGATIVE
Glucose, UA: NEGATIVE
Ketones, UA: NEGATIVE
NITRITE UA: NEGATIVE
Protein, UA: NEGATIVE
RBC, UA: NEGATIVE
Specific Gravity, UA: 1.02 (ref 1.005–1.030)
Urobilinogen, Ur: 1 mg/dL (ref 0.2–1.0)
pH, UA: 6.5 (ref 5.0–7.5)

## 2016-05-26 LAB — MICROSCOPIC EXAMINATION
BACTERIA UA: NONE SEEN
EPITHELIAL CELLS (NON RENAL): NONE SEEN /HPF (ref 0–10)

## 2016-05-26 MED ORDER — LIDOCAINE HCL 2 % EX GEL
1.0000 "application " | Freq: Once | CUTANEOUS | Status: AC
  Administered 2016-05-26: 1 via URETHRAL

## 2016-05-26 MED ORDER — CIPROFLOXACIN HCL 500 MG PO TABS
500.0000 mg | ORAL_TABLET | Freq: Once | ORAL | Status: AC
  Administered 2016-05-26: 500 mg via ORAL

## 2016-05-26 NOTE — Progress Notes (Signed)
10:13 AM  05/26/16   [name] Hardin 1931/08/20 [id number]  Referring provider: [name], MD [place], [place] [address]  Chief Complaint  Patient presents with  . 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  He returns today for routine surveillance cystoscopy.  CT urogram on 07/02/2014 showed a 16 mm bladder stone as well as a 1 cm area on the anterior wall of the bladder suspicious for bladder carcinoma. He was taken to the operating room on 1/227/2016 for cystolitholapaxy and TURBT. At this time, the stone was completely fragmented and removed and as small 1 cm well-circumscribed frondular tumor was noted and the LEFT anterior wall of the bladder. 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   He completed BCG x 6 (induction course) on 10/2014.  Smoking history of 3ppd x >39yr. Quit 13 years ago.  No voiding complaints today.    Last cystoscopy 02/2016 negative for recurrence, cytology negative.     PMH: Past Medical History:  Diagnosis Date  . ATRIAL FIBRILLATION 03/05/2010   Qualifier: Diagnosis of  By: [name], FMartyn Malay  . Benign hypertension 12/16/2013  . Chronic obstructive pulmonary disease ([place] 12/16/2013  . COPD (chronic obstructive pulmonary disease) ([facility]   . Diabetes mellitus, type 2 ([place] 12/16/2013   Last Assessment & Plan:  The patient reports good glucose control. No frequent hypoglycemia is noted. Tolerating hypoglycemic medications without significant difficulty. No nausea   . Hx pulmonary embolism 08/2000  . Hypercholesterolemia   . Hyperglycemia   . Hypertension   . Osteoarthritis   . PREMATURE VENTRICULAR CONTRACTIONS 03/05/2010   Qualifier: Diagnosis of  By: [name], FMartyn Malay  . Pulmonary embolism ([place] 12/16/2013  . Stroke  ([facility] 1997    Surgical History: Past Surgical History:  Procedure Laterality Date  . PERIPHERAL VASCULAR CATHETERIZATION N/A 02/04/2015   Procedure: IVC Filter Removal;  Surgeon: GKatha Cabal MD;  [facility];  Service: Cardiovascular;  Laterality: N/A;  . TONSILLECTOMY      Home Medications:    Medication List       Accurate as of 05/26/16 10:13 AM. Always use your most recent med list.          ADVAIR DISKUS 250-50 MCG/DOSE Aepb Generic drug:  Fluticasone-Salmeterol Inhale 1 puff into the lungs 2 (two) times daily.   allopurinol 300 MG tablet Commonly known as:  ZYLOPRIM Take 300 mg by mouth daily.   aspirin 81 MG tablet Take 81 mg by mouth daily.   COLCRYS 0.6 MG tablet Generic drug:  colchicine Take 0.6 mg by mouth daily.   diltiazem 240 MG 24 hr capsule Commonly known as:  CARDIZEM CD Take 240 mg by mouth daily.   furosemide 20 MG tablet Commonly known as:  LASIX   MULTI-DAY Tabs Take by mouth.   potassium chloride 10 MEQ CR tablet Commonly known as:  KLOR-CON Take 10 mEq by mouth daily.   simvastatin 40 MG tablet Commonly known as:  ZOCOR Take 40 mg by mouth at bedtime.   SPIRIVA HANDIHALER 18 MCG inhalation capsule Generic drug:  tiotropium Place 18 mcg into inhaler and inhale daily.   vitamin B-12 100 MCG tablet Commonly known as:  CYANOCOBALAMIN Take 50 mcg by mouth every  30 (thirty) days.   warfarin 5 MG tablet Commonly known as:  COUMADIN Take 5 mg by mouth as directed.       Allergies: No Known Allergies  Family History: Family History  Problem Relation Age of Onset  . Prostate cancer Neg Hx   . Kidney cancer Neg Hx     Social History:  reports that he has quit smoking. He has never used smokeless tobacco. His alcohol and drug histories are not on file.   Physical Exam: BP (!) 147/72   Pulse (!) 106   Ht _0  (1.803 m)   Wt (!) 304 lb (137.9 kg)   BMI 42.40 kg/m   Constitutional:  Alert and  oriented, No acute distress. HEENT: [place] AT, moist mucus membranes.  Trachea midline, no masses. Cardiovascular: No clubbing, cyanosis, or edema. Respiratory: Normal respiratory effort, no increased work of breathing. GI: Abdomen is soft, nontender, nondistended, no abdominal masses GU: No CVA tenderness. Normal phallus, orthotopic meatus. Skin: No rashes, bruises or suspicious lesions. Neurologic: Grossly intact, no focal deficits, moving all 4 extremities. Psychiatric: Normal mood and affect.  Laboratory Data: Basic Metabolic Panel (BMP) (65/99/3570 11:24 AM) Basic Metabolic Panel (BMP) (17/79/3903 11:24 AM)  Component Value Ref Range  Glucose 91 70 - 110 mg/dL  Sodium 142 136 - 145 mmol/L  Potassium 4.2 3.6 - 5.1 mmol/L  Chloride 107 97 - 109 mmol/L  Carbon Dioxide (CO2) 28.8 22.0 - 32.0 mmol/L  Calcium 9.2 8.7 - 10.3 mg/dL  Urea Nitrogen (BUN) 18 7 - 25 mg/dL  Creatinine 1.1 0.7 - 1.3 mg/dL  Glomerular Filtration Rate (eGFR), MDRD Estimate 64 >60 mL/min/1.73sq m  BUN/Crea Ratio 16.4 6.0 - 20.0  Anion Gap w/K 10.4 6.0 - 16.0    Urinalysis See epic.  Unremarkable.  Cystoscopy Procedure Note  Patient identification was confirmed, informed consent was obtained, and patient was prepped using Betadine solution.  Lidocaine jelly was administered per urethral meatus.    Preoperative abx where received prior to procedure.     Pre-Procedure: - Inspection reveals a normal caliber ureteral meatus.  Procedure: The flexible cystoscope was introduced without difficulty - No urethral strictures/lesions are present. - Enlarged prostate with bilobar coapation - Elevated bladder neck - Bilateral ureteral orifices identified - Bladder mucosa  reveals no ulcers, tumors, or lesions.   - No bladder stones - Mild trabeculation  Retroflexion shows no obvious tumor   Post-Procedure: - Patient tolerated the procedure well    Assessment & Plan:  History of HgTa TCC 08/02/14 s/p TURBT,  BCG x 6. S/p negative surveillance cystoscopy  1. Malignant neoplasm of anterior wall of urinary bladder/ history of bladder cancer Recommend every 3 monthly cysto dose per surveillance protocol for two year then increasing intervals. No evidence of recurrence today.  - Urinalysis, Complete - lidocaine (XYLOCAINE) 2 % jelly 1 application; Place 1 application into the urethra once. - ciprofloxacin (CIPRO) tablet 500 mg; Take 1 tablet (500 mg total) by mouth once.   Return in about 3 months (around 08/26/2016) for cysto.  Hollice Espy, MD  [facility] [address] [place], [place] [address] [phone]

## 2016-08-27 ENCOUNTER — Encounter: Payer: Self-pay | Admitting: Urology

## 2016-08-27 ENCOUNTER — Ambulatory Visit (INDEPENDENT_AMBULATORY_CARE_PROVIDER_SITE_OTHER): Payer: Medicare Other | Admitting: Urology

## 2016-08-27 VITALS — BP 129/69 | HR 88 | Ht 71.0 in | Wt 300.6 lb

## 2016-08-27 DIAGNOSIS — Z8551 Personal history of malignant neoplasm of bladder: Secondary | ICD-10-CM

## 2016-08-27 DIAGNOSIS — R3129 Other microscopic hematuria: Secondary | ICD-10-CM

## 2016-08-27 LAB — URINALYSIS, COMPLETE
Bilirubin, UA: NEGATIVE
Glucose, UA: NEGATIVE
Ketones, UA: NEGATIVE
NITRITE UA: NEGATIVE
PH UA: 5.5 (ref 5.0–7.5)
Specific Gravity, UA: 1.02 (ref 1.005–1.030)
Urobilinogen, Ur: 1 mg/dL (ref 0.2–1.0)

## 2016-08-27 LAB — MICROSCOPIC EXAMINATION: BACTERIA UA: NONE SEEN

## 2016-08-27 MED ORDER — LIDOCAINE HCL 2 % EX GEL
1.0000 "application " | Freq: Once | CUTANEOUS | Status: AC
  Administered 2016-08-27: 1 via URETHRAL

## 2016-08-27 MED ORDER — CIPROFLOXACIN HCL 500 MG PO TABS
500.0000 mg | ORAL_TABLET | Freq: Once | ORAL | Status: AC
  Administered 2016-08-27: 500 mg via ORAL

## 2016-08-27 NOTE — Progress Notes (Signed)
9:14 AM  08/27/16  [name] Hardin 1932-06-18 [id number]  Referring provider: [name], MD [place], [place] [address]  Chief Complaint  Patient presents with  . 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  He returns today for routine surveillance cystoscopy.  CT urogram on 07/02/2014 showed a 16 mm bladder stone as well as a 1 cm area on the anterior wall of the bladder suspicious for bladder carcinoma. He was taken to the operating room on 1/227/2016 for cystolitholapaxy and TURBT. At this time, the stone was completely fragmented and removed and as small 1 cm well-circumscribed frondular tumor was noted and the LEFT anterior wall of the bladder. 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   He completed BCG x 6 (induction course) on 10/2014.  Smoking history of 3ppd x >67yrs. Quit 13 years ago.  No voiding complaints today.    Last cystoscopy 05/2016 negative for recurrence, cytology negative.     PMH: Past Medical History:  Diagnosis Date  . ATRIAL FIBRILLATION 03/05/2010   Qualifier: Diagnosis of  By: Lovena Le, MD, Martyn Malay   . Benign hypertension 12/16/2013  . Chronic obstructive pulmonary disease ([place]) 12/16/2013  . COPD (chronic obstructive pulmonary disease) ([place])   . Diabetes mellitus, type 2 ([place]) 12/16/2013   Last Assessment & Plan:  The patient reports good glucose control. No frequent hypoglycemia is noted. Tolerating hypoglycemic medications without significant difficulty. No nausea   . Hx pulmonary embolism 08/2000  . Hypercholesterolemia   . Hyperglycemia   . Hypertension   . Osteoarthritis   . PREMATURE VENTRICULAR CONTRACTIONS 03/05/2010   Qualifier: Diagnosis of  By: Lovena Le, MD, Martyn Malay   .  Pulmonary embolism ([place]) 12/16/2013  . Stroke [facility]) 1997    Surgical History: Past Surgical History:  Procedure Laterality Date  . PERIPHERAL VASCULAR CATHETERIZATION N/A 02/04/2015   Procedure: IVC Filter Removal;  Surgeon: Katha Cabal, MD;  Location: [facility];  Service: Cardiovascular;  Laterality: N/A;  . TONSILLECTOMY      Home Medications:  Allergies as of 08/27/2016   No Known Allergies     Medication List       Accurate as of 08/27/16  9:14 AM. Always use your most recent med list.          ADVAIR DISKUS 250-50 MCG/DOSE Aepb Generic drug:  Fluticasone-Salmeterol Inhale 1 puff into the lungs 2 (two) times daily.   allopurinol 300 MG tablet Commonly known as:  ZYLOPRIM Take 300 mg by mouth daily.   aspirin 81 MG tablet Take 81 mg by mouth daily.   COLCRYS 0.6 MG tablet Generic drug:  colchicine Take 0.6 mg by mouth daily.   diltiazem 240 MG 24 hr capsule Commonly known as:  CARDIZEM CD Take 240 mg by mouth daily.   furosemide 20 MG tablet Commonly known as:  LASIX   MULTI-DAY Tabs Take by mouth.   potassium chloride 10 MEQ CR tablet Commonly known as:  KLOR-CON Take 10 mEq by mouth daily.   simvastatin 40 MG tablet Commonly known as:  ZOCOR Take 40 mg by mouth at bedtime.   SPIRIVA HANDIHALER 18 MCG inhalation capsule Generic drug:  tiotropium Place 18  mcg into inhaler and inhale daily.   vitamin B-12 100 MCG tablet Commonly known as:  CYANOCOBALAMIN Take 50 mcg by mouth every 30 (thirty) days.   warfarin 5 MG tablet Commonly known as:  COUMADIN Take 5 mg by mouth as directed.       Allergies: No Known Allergies  Family History: Family History  Problem Relation Age of Onset  . Prostate cancer Neg Hx   . Kidney cancer Neg Hx     Social History:  reports that he has quit smoking. He has never used smokeless tobacco. His alcohol and drug histories are not on file.   Physical Exam: BP 129/69   Pulse 88   Ht 5\' 11"   (1.803 m)   Wt (!) 300 lb 9.6 oz (136.4 kg)   BMI 41.93 kg/m   Constitutional:  Alert and oriented, No acute distress. HEENT: [place] AT, moist mucus membranes.  Trachea midline, no masses. Cardiovascular: No clubbing, cyanosis, or edema. Respiratory: Normal respiratory effort, no increased work of breathing. GI: Abdomen is soft, nontender, nondistended, no abdominal masses GU: No CVA tenderness. Normal phallus, orthotopic meatus. Skin: No rashes, bruises or suspicious lesions. Neurologic: Grossly intact, no focal deficits, moving all 4 extremities. Psychiatric: Normal mood and affect.  Laboratory Data: Labs 05/03/16 Cr 1.2 (baseline)  Urinalysis See epic.  Unremarkable other than for 3-10 RBC/ HPF.  Cystoscopy Procedure Note  Patient identification was confirmed, informed consent was obtained, and patient was prepped using Betadine solution.  Lidocaine jelly was administered per urethral meatus.    Preoperative abx where received prior to procedure.     Pre-Procedure: - Inspection reveals a normal caliber ureteral meatus.  Procedure: The flexible cystoscope was introduced without difficulty - No urethral strictures/lesions are present. - Enlarged prostate with bilobar coapation - Elevated bladder neck - Bilateral ureteral orifices identified - Bladder mucosa  reveals no ulcers, tumors, or lesions.   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 S/p negative surveillance cystoscopy  1. Malignant neoplasm of anterior wall of urinary bladder/ history of bladder cancer No evidence of recurrence today. Given that its been 2 years since his last recurrence, may spread out cystoscopy to every 6 months. - Urinalysis, Complete - lidocaine (XYLOCAINE) 2 % jelly 1 application; Place 1 application into the urethra once. -  ciprofloxacin (CIPRO) tablet 500 mg; Take 1 tablet (500 mg total) by mouth once.  2. Microscopic hematuria No recent upper tract imaging Will order renal ultrasound today- call with results  Return in about 6 months (around 02/24/2017) for cystsocopy.  Hollice Espy, MD  [facility] [address] [place], [place] [address] [phone]

## 2016-09-03 ENCOUNTER — Ambulatory Visit
Admission: RE | Admit: 2016-09-03 | Discharge: 2016-09-03 | Disposition: A | Discharge status: Home / Self Care | Payer: Medicare Other | Source: Ambulatory Visit | Attending: Urology | Admitting: Urology

## 2016-09-03 DIAGNOSIS — R3129 Other microscopic hematuria: Secondary | ICD-10-CM | POA: Insufficient documentation

## 2016-09-03 DIAGNOSIS — N281 Cyst of kidney, acquired: Secondary | ICD-10-CM | POA: Diagnosis not present

## 2016-09-06 ENCOUNTER — Telehealth: Payer: Self-pay

## 2016-09-06 NOTE — Telephone Encounter (Signed)
-----   Message from Hollice Espy, MD sent at 09/06/2016  7:43 AM EST ----- Renal ultrasound looks great.  Hollice Espy, MD

## 2016-09-06 NOTE — Telephone Encounter (Signed)
Spoke with pt in reference to RUS results. Pt voiced understanding.  

## 2017-02-25 ENCOUNTER — Ambulatory Visit (INDEPENDENT_AMBULATORY_CARE_PROVIDER_SITE_OTHER): Payer: Medicare Other | Admitting: Urology

## 2017-02-25 ENCOUNTER — Encounter: Payer: Self-pay | Admitting: Urology

## 2017-02-25 VITALS — BP 128/66 | HR 99 | Ht 71.0 in | Wt 296.0 lb

## 2017-02-25 DIAGNOSIS — C679 Malignant neoplasm of bladder, unspecified: Secondary | ICD-10-CM | POA: Diagnosis not present

## 2017-02-25 LAB — URINALYSIS, COMPLETE
Bilirubin, UA: NEGATIVE
GLUCOSE, UA: NEGATIVE
KETONES UA: NEGATIVE
LEUKOCYTES UA: NEGATIVE
NITRITE UA: NEGATIVE
Urobilinogen, Ur: 1 mg/dL (ref 0.2–1.0)
pH, UA: 6 (ref 5.0–7.5)

## 2017-02-25 LAB — MICROSCOPIC EXAMINATION: BACTERIA UA: NONE SEEN

## 2017-02-25 MED ORDER — CIPROFLOXACIN HCL 500 MG PO TABS
500.0000 mg | ORAL_TABLET | Freq: Once | ORAL | Status: AC
  Administered 2017-02-25: 500 mg via ORAL

## 2017-02-25 MED ORDER — LIDOCAINE HCL 2 % EX GEL
1.0000 "application " | Freq: Once | CUTANEOUS | Status: AC
  Administered 2017-02-25: 1 via URETHRAL

## 2017-02-25 NOTE — Progress Notes (Signed)
9:38 AM  02/25/17  [name] Hardin Mar 01, 1932 [id number]  Referring provider: [name], MD [place] [facility] [place], [place] [address]  Chief Complaint  Patient presents with  . 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  He returns today for routine surveillance cystoscopy.  CT urogram on 07/02/2014 showed a 16 mm bladder stone as well as a 1 cm area on the anterior wall of the bladder suspicious for bladder carcinoma. He was taken to the operating room on 1/227/2016 for cystolitholapaxy and TURBT. At this time, the stone was completely fragmented and removed and as small 1 cm well-circumscribed frondular tumor was noted and the LEFT anterior wall of the bladder. 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   He completed BCG x 6 (induction course) on 10/2014.  Most recent upper tract imaging on 2/18 --> RUS negative other than bilateral bosniak I renal cyst  Smoking history of 3ppd x >22yrs. Quit 13 years ago.  No voiding complaints today.    Last cystoscopy 2/18 negative for recurrence, cytology negative.     PMH: Past Medical History:  Diagnosis Date  . ATRIAL FIBRILLATION 03/05/2010   Qualifier: Diagnosis of  By: Lovena Le, MD, Martyn Malay   . Benign hypertension 12/16/2013  . Chronic obstructive pulmonary disease ([place]) 12/16/2013  . COPD (chronic obstructive pulmonary disease) ([place])   . Diabetes mellitus, type 2 ([place]) 12/16/2013   Last Assessment & Plan:  The patient reports good glucose control. No frequent hypoglycemia is noted. Tolerating hypoglycemic medications without significant difficulty. No nausea   . Hx pulmonary embolism 08/2000  . Hypercholesterolemia   . Hyperglycemia   . Hypertension   . Osteoarthritis   . PREMATURE VENTRICULAR CONTRACTIONS 03/05/2010   Qualifier:  Diagnosis of  By: Lovena Le, MD, Martyn Malay   . Pulmonary embolism ([place]) 12/16/2013  . Stroke [facility]) 1997    Surgical History: Past Surgical History:  Procedure Laterality Date  . PERIPHERAL VASCULAR CATHETERIZATION N/A 02/04/2015   Procedure: IVC Filter Removal;  Surgeon: Katha Cabal, MD;  Location: [facility];  Service: Cardiovascular;  Laterality: N/A;  . TONSILLECTOMY      Home Medications:  Allergies as of 02/25/2017   No Known Allergies     Medication List       Accurate as of 02/25/17  9:38 AM. Always use your most recent med list.          ADVAIR DISKUS 250-50 MCG/DOSE Aepb Generic drug:  Fluticasone-Salmeterol Inhale 1 puff into the lungs 2 (two) times daily.   allopurinol 300 MG tablet Commonly known as:  ZYLOPRIM Take 300 mg by mouth daily.   aspirin 81 MG tablet Take 81 mg by mouth daily.   COLCRYS 0.6 MG tablet Generic drug:  colchicine Take 0.6 mg by mouth daily.   diltiazem 240 MG 24 hr capsule Commonly known as:  CARDIZEM CD Take 240 mg by mouth daily.   furosemide 20 MG tablet Commonly known as:  LASIX   MULTI-DAY Tabs Take by mouth.   potassium chloride 10 MEQ CR tablet Commonly known as:  KLOR-CON Take 10 mEq by mouth daily.   simvastatin 40 MG tablet Commonly known as:  ZOCOR Take 40 mg by mouth at bedtime.   SPIRIVA HANDIHALER 18 MCG inhalation capsule Generic  drug:  tiotropium Place 18 mcg into inhaler and inhale daily.   vitamin B-12 100 MCG tablet Commonly known as:  CYANOCOBALAMIN Take 50 mcg by mouth every 30 (thirty) days.   warfarin 5 MG tablet Commonly known as:  COUMADIN Take 5 mg by mouth as directed.       Allergies: No Known Allergies  Family History: Family History  Problem Relation Age of Onset  . Prostate cancer Neg Hx   . Kidney cancer Neg Hx     Social History:  reports that he has quit smoking. He has never used smokeless tobacco. His alcohol and drug histories are not on  file.   Physical Exam: BP 128/66   Pulse 99   Ht 5\' 11"  (1.803 m)   Wt 296 lb (134.3 kg)   BMI 41.28 kg/m   Constitutional:  Alert and oriented, No acute distress. HEENT: [place] AT, moist mucus membranes.  Trachea midline, no masses. Cardiovascular: No clubbing, cyanosis, or edema. Respiratory: Normal respiratory effort, no increased work of breathing. GI: Abdomen is soft, nontender, nondistended, no abdominal masses GU: No CVA tenderness. Normal phallus, orthotopic meatus. Skin: No rashes, bruises or suspicious lesions. Neurologic: Grossly intact, no focal deficits, moving all 4 extremities. Psychiatric: Normal mood and affect.  Laboratory Data: Labs 05/03/16 Cr 1.2 (baseline)  Urinalysis See epic.  Unremarkable other than for 3-10 RBC/ HPF.  Cystoscopy Procedure Note  Patient identification was confirmed, informed consent was obtained, and patient was prepped using Betadine solution.  Lidocaine jelly was administered per urethral meatus.    Preoperative abx where received prior to procedure.     Pre-Procedure: - Inspection reveals a normal caliber ureteral meatus.  Procedure: The flexible cystoscope was introduced without difficulty - No urethral strictures/lesions are present. 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 S/p negative surveillance cystoscopy  1. Malignant neoplasm of anterior wall of urinary bladder/ history of bladder cancer Fine change on right lateral wall either representing early recurrence or focal edema Option of proceeding to the operating room  for biopsy of this area versus reevaluation in approximately 2 months with cystoscopy discussed to assess for progression or resolution He is most interested in repeat cystoscopy in 2 months- if the lesion persists or progresses, would recommend biopsying operating room and bilateral retrogrades He is agreeable with this plan and understands the extreme importance of following up as discussed - Urinalysis, Complete - lidocaine (XYLOCAINE) 2 % jelly 1 application; Place 1 application into the urethra once. - ciprofloxacin (CIPRO) tablet 500 mg; Take 1 tablet (500 mg total) by mouth once.   Return in about 2 months (around 04/27/2017) for cysto.  Hollice Espy, MD  [facility] [address] [place], [place] [address] [phone]

## 2017-04-27 ENCOUNTER — Ambulatory Visit (INDEPENDENT_AMBULATORY_CARE_PROVIDER_SITE_OTHER): Payer: Medicare Other | Admitting: Urology

## 2017-04-27 ENCOUNTER — Encounter: Payer: Self-pay | Admitting: Urology

## 2017-04-27 VITALS — BP 136/69 | HR 98 | Ht 71.0 in | Wt 299.0 lb

## 2017-04-27 DIAGNOSIS — C679 Malignant neoplasm of bladder, unspecified: Secondary | ICD-10-CM | POA: Diagnosis not present

## 2017-04-27 LAB — MICROSCOPIC EXAMINATION: EPITHELIAL CELLS (NON RENAL): NONE SEEN /HPF (ref 0–10)

## 2017-04-27 LAB — URINALYSIS, COMPLETE
Bilirubin, UA: NEGATIVE
Glucose, UA: NEGATIVE
Ketones, UA: NEGATIVE
Nitrite, UA: NEGATIVE
PH UA: 6 (ref 5.0–7.5)
Specific Gravity, UA: 1.025 (ref 1.005–1.030)
UUROB: 1 mg/dL (ref 0.2–1.0)

## 2017-04-27 MED ORDER — LIDOCAINE HCL 2 % EX GEL
1.0000 "application " | Freq: Once | CUTANEOUS | Status: AC
  Administered 2017-04-27: 1 via URETHRAL

## 2017-04-27 MED ORDER — CIPROFLOXACIN HCL 500 MG PO TABS
500.0000 mg | ORAL_TABLET | Freq: Once | ORAL | Status: AC
  Administered 2017-04-27: 500 mg via ORAL

## 2017-04-27 NOTE — Progress Notes (Signed)
9:51 AM  04/27/17  [name] Hardin [birth year] [id number]  Referring provider: [name], MD [place] [facility] [place], [place] [address]  Chief Complaint  Patient presents with  . 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  He returns today for routine surveillance cystoscopy.   CT urogram on 07/02/2014 showed a 16 mm bladder stone as well as a 1 cm area on the anterior wall of the bladder suspicious for bladder carcinoma. He was taken to the operating room on 1/227/2016 for cystolitholapaxy and TURBT.At this time, the stone was completely fragmented and removed and as small 1 cm well-circumscribed frondular tumor was noted and the LEFT anterior wall of the bladder. 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   He completed BCG x 6 (induction course) on 10/2014.  Most recent upper tract imaging on 2/18 --> RUS negative other than bilateral bosniak I renal cyst  Smoking history of 3ppd x >48yrs. Quit 13 years ago.  No voiding complaints today.    Cystoscopy 2/18 negative for recurrence, cytology negative.  Cysto 2 months ago however showed fine papillary change on the right lateral wall concerning for either early recurrence versus edema. He returns today for cystoscopy for reevaluation.   PMH: Past Medical History:  Diagnosis Date  . ATRIAL FIBRILLATION 03/05/2010   Qualifier: Diagnosis of  By: Lovena Le, MD, Martyn Malay   . Benign hypertension 12/16/2013  . Chronic obstructive pulmonary disease ([place]) 12/16/2013  . COPD (chronic obstructive pulmonary disease) ([place])   . Diabetes mellitus, type 2 ([place]) 12/16/2013   Last Assessment & Plan:  The patient reports good glucose control. No frequent hypoglycemia is noted. Tolerating hypoglycemic medications without significant difficulty. No nausea     . Hx pulmonary embolism 08/2000  . Hypercholesterolemia   . Hyperglycemia   . Hypertension   . Osteoarthritis   . PREMATURE VENTRICULAR CONTRACTIONS 03/05/2010   Qualifier: Diagnosis of  By: Lovena Le, MD, Martyn Malay   . Pulmonary embolism ([place]) 12/16/2013  . Stroke [facility]) 1997    Surgical History: Past Surgical History:  Procedure Laterality Date  . PERIPHERAL VASCULAR CATHETERIZATION N/A 02/04/2015   Procedure: IVC Filter Removal;  Surgeon: Katha Cabal, MD;  Location: [facility];  Service: Cardiovascular;  Laterality: N/A;  . TONSILLECTOMY      Home Medications:  Allergies as of 04/27/2017   No Known Allergies     Medication List       Accurate as of 04/27/17  9:51 AM. Always use your most recent med list.          ADVAIR DISKUS 250-50 MCG/DOSE Aepb Generic drug:  Fluticasone-Salmeterol Inhale 1 puff into the lungs 2 (two) times daily.   allopurinol 300 MG tablet Commonly known as:  ZYLOPRIM Take 300 mg by mouth daily.   aspirin 81 MG tablet Take 81 mg by mouth daily.   COLCRYS 0.6 MG tablet Generic drug:  colchicine Take 0.6 mg by mouth daily.   diltiazem 240 MG 24 hr capsule Commonly known as:  CARDIZEM CD Take 240 mg by mouth daily.   furosemide 20 MG tablet Commonly known as:  LASIX   MULTI-DAY Tabs Take by mouth.   potassium chloride 10 MEQ CR tablet Commonly known as:  KLOR-CON Take 10 mEq by mouth daily.  simvastatin 40 MG tablet Commonly known as:  ZOCOR Take 40 mg by mouth at bedtime.   SPIRIVA HANDIHALER 18 MCG inhalation capsule Generic drug:  tiotropium Place 18 mcg into inhaler and inhale daily.   vitamin B-12 100 MCG tablet Commonly known as:  CYANOCOBALAMIN Take 50 mcg by mouth every 30 (thirty) days.   warfarin 5 MG tablet Commonly known as:  COUMADIN Take 5 mg by mouth as directed.       Allergies: No Known Allergies  Family History: Family History  Problem Relation Age of Onset  . Prostate  cancer Neg Hx   . Kidney cancer Neg Hx     Social History:  reports that he has quit smoking. He has never used smokeless tobacco. His alcohol and drug histories are not on file.   Physical Exam: There were no vitals taken for this visit.  Constitutional:  Alert and oriented, No acute distress. HEENT: [place] AT, moist mucus membranes.  Trachea midline, no masses. Cardiovascular: No clubbing, cyanosis, or edema. Respiratory: Normal respiratory effort, no increased work of breathing. GI: Abdomen is soft, nontender, nondistended, no abdominal masses GU: No CVA tenderness. Normal phallus, orthotopic meatus. Skin: No rashes, bruises or suspicious lesions. Neurologic: Grossly intact, no focal deficits, moving all 4 extremities. Psychiatric: Normal mood and affect.  Laboratory Data: Cr 1.2 (baseline)  Urinalysis See epic.    Cystoscopy Procedure Note  Patient identification was confirmed, informed consent was obtained, and patient was prepped using Betadine solution.  Lidocaine jelly was administered per urethral meatus.    Preoperative abx where received prior to procedure.     Pre-Procedure: - Inspection reveals a normal caliber ureteral meatus.  Procedure: The flexible cystoscope was introduced without difficulty - No urethral strictures/lesions are present. 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   1. Malignant neoplasm of anterior wall of urinary bladder/ history of bladder cancer Slight progression of suspicious  right lateral wall lesion either representing early recurrence or focal edema  At this point in time, feel it is most prudent to proceed back to the operating room for biopsy of this lesion, bilateral retrograde pyelogram. He will need cardiac clearance as previously described off his Coumadin but may continue his aspirin. He was previously followed by Dr. Towanda Malkin.  Risk of the procedure were explained in detail including risk of bleeding, infection, bladder irritation, damage is running structures. All questions were answered.   - Urinalysis, Complete - lidocaine (XYLOCAINE) 2 % jelly 1 application; Place 1 application into the urethra once. - ciprofloxacin (CIPRO) tablet 500 mg; Take 1 tablet (500 mg total) by mouth once.   Schedule bladder biopsy, bilateral retrograde pyelogram as above  Hollice Espy, MD  [facility] [address] [place], [place] [address] [phone]

## 2017-04-27 NOTE — H&P (View-Only) (Signed)
9:51 AM  04/27/17  Marks Scalera Hamill 04/03/1932 [id number]  Referring provider: [name], MD [facility] [place], [place] [id number]  Chief Complaint  Patient presents with  . 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  He returns today for routine surveillance cystoscopy.   CT urogram on 07/02/2014 showed a 16 mm bladder stone as well as a 1 cm area on the anterior wall of the bladder suspicious for bladder carcinoma. He was taken to the operating room on 1/227/2016 for cystolitholapaxy and TURBT.At this time, the stone was completely fragmented and removed and as small 1 cm well-circumscribed frondular tumor was noted and the LEFT anterior wall of the bladder. 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   He completed BCG x 6 (induction course) on 10/2014.  Most recent upper tract imaging on 2/18 --> RUS negative other than bilateral bosniak I renal cyst  Smoking history of 3ppd x >44yrs. Quit 13 years ago.  No voiding complaints today.    Cystoscopy 2/18 negative for recurrence, cytology negative.  Cysto 83 months ago however showed fine papillary change on the right lateral wall concerning for either early recurrence versus edema. He returns today for cystoscopy for reevaluation.   PMH: Past Medical History:  Diagnosis Date  . ATRIAL FIBRILLATION 03/05/2010   Qualifier: Diagnosis of  By: Lovena Le, MD, Martyn Malay   . Benign hypertension 12/16/2013  . Chronic obstructive pulmonary disease ([place]) 12/16/2013  . COPD (chronic obstructive pulmonary disease) ([place])   . Diabetes mellitus, type 2 ([place]) 12/16/2013   Last Assessment & Plan:  The patient reports good glucose control. No frequent hypoglycemia is noted. Tolerating hypoglycemic medications without significant difficulty. No nausea     . Hx pulmonary embolism 08/2000  . Hypercholesterolemia   . Hyperglycemia   . Hypertension   . Osteoarthritis   . PREMATURE VENTRICULAR CONTRACTIONS 03/05/2010   Qualifier: Diagnosis of  By: Lovena Le, MD, Martyn Malay   . Pulmonary embolism ([place]) 12/16/2013  . Stroke [facility]) 1997    Surgical History: Past Surgical History:  Procedure Laterality Date  . PERIPHERAL VASCULAR CATHETERIZATION N/A 02/04/2015   Procedure: IVC Filter Removal;  Surgeon: Katha Cabal, MD;  Location: [facility];  Service: Cardiovascular;  Laterality: N/A;  . TONSILLECTOMY      Home Medications:  Allergies as of 04/27/2017   No Known Allergies     Medication List       Accurate as of 04/27/17  9:51 AM. Always use your most recent med list.          ADVAIR DISKUS 250-50 MCG/DOSE Aepb Generic drug:  Fluticasone-Salmeterol Inhale 1 puff into the lungs 2 (two) times daily.   allopurinol 300 MG tablet Commonly known as:  ZYLOPRIM Take 300 mg by mouth daily.   aspirin 81 MG tablet Take 81 mg by mouth daily.   COLCRYS 0.6 MG tablet Generic drug:  colchicine Take 0.6 mg by mouth daily.   diltiazem 240 MG 24 hr capsule Commonly known as:  CARDIZEM CD Take 240 mg by mouth daily.   furosemide 20 MG tablet Commonly known as:  LASIX   MULTI-DAY Tabs Take by mouth.   potassium chloride 10 MEQ CR tablet Commonly known as:  KLOR-CON Take 10 mEq by mouth daily.  simvastatin 40 MG tablet Commonly known as:  ZOCOR Take 40 mg by mouth at bedtime.   SPIRIVA HANDIHALER 18 MCG inhalation capsule Generic drug:  tiotropium Place 18 mcg into inhaler and inhale daily.   vitamin B-12 100 MCG tablet Commonly known as:  CYANOCOBALAMIN Take 50 mcg by mouth every 30 (thirty) days.   warfarin 5 MG tablet Commonly known as:  COUMADIN Take 5 mg by mouth as directed.       Allergies: No Known Allergies  Family History: Family History  Problem Relation Age of Onset  . Prostate  cancer Neg Hx   . Kidney cancer Neg Hx     Social History:  reports that he has quit smoking. He has never used smokeless tobacco. His alcohol and drug histories are not on file.   Physical Exam: There were no vitals taken for this visit.  Constitutional:  Alert and oriented, No acute distress. HEENT: [place] AT, moist mucus membranes.  Trachea midline, no masses. Cardiovascular: No clubbing, cyanosis, or edema. Respiratory: Normal respiratory effort, no increased work of breathing. GI: Abdomen is soft, nontender, nondistended, no abdominal masses GU: No CVA tenderness. Normal phallus, orthotopic meatus. Skin: No rashes, bruises or suspicious lesions. Neurologic: Grossly intact, no focal deficits, moving all 4 extremities. Psychiatric: Normal mood and affect.  Laboratory Data: Cr 1.2 (baseline)  Urinalysis See epic.    Cystoscopy Procedure Note  Patient identification was confirmed, informed consent was obtained, and patient was prepped using Betadine solution.  Lidocaine jelly was administered per urethral meatus.    Preoperative abx where received prior to procedure.     Pre-Procedure: - Inspection reveals a normal caliber ureteral meatus.  Procedure: The flexible cystoscope was introduced without difficulty - No urethral strictures/lesions are present. 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   1. Malignant neoplasm of anterior wall of urinary bladder/ history of bladder cancer Slight progression of suspicious  right lateral wall lesion either representing early recurrence or focal edema  At this point in time, feel it is most prudent to proceed back to the operating room for biopsy of this lesion, bilateral retrograde pyelogram. He will need cardiac clearance as previously described off his Coumadin but may continue his aspirin. He was previously followed by Dr. Towanda Malkin.  Risk of the procedure were explained in detail including risk of bleeding, infection, bladder irritation, damage is running structures. All questions were answered.   - Urinalysis, Complete - lidocaine (XYLOCAINE) 2 % jelly 1 application; Place 1 application into the urethra once. - ciprofloxacin (CIPRO) tablet 500 mg; Take 1 tablet (500 mg total) by mouth once.   Schedule bladder biopsy, bilateral retrograde pyelogram as above  Hollice Espy, MD  [facility] [address] [place],  [id number] [phone]

## 2017-04-29 ENCOUNTER — Telehealth: Payer: Self-pay

## 2017-04-29 NOTE — Telephone Encounter (Signed)
Patient was notified that surgery was scheduled for 05-10-17 and pre-op on 05-05-17 @11 :45. A reminder was mailed. Waiting on cardiac clearance from Dr. Tonette Bihari office for patient to stop coumadin 5-7 days prior to surgery, will notify patient when received. Patient may continue on ASA per Dr. Erlene Quan

## 2017-04-30 LAB — CULTURE, URINE COMPREHENSIVE

## 2017-05-04 NOTE — Telephone Encounter (Signed)
Confirmation received from Dr Tonette Bihari office that the patient was cleared for surgery. Notified the patient to go ahead and stop his Coumadin. He is aware of this and aware of his pre admit appointment for 05/05/17 at 11:45 am

## 2017-05-05 ENCOUNTER — Encounter
Admission: RE | Admit: 2017-05-05 | Discharge: 2017-05-05 | Disposition: A | Discharge status: Home / Self Care | Payer: Medicare Other | Source: Ambulatory Visit | Attending: Urology | Admitting: Urology

## 2017-05-05 DIAGNOSIS — Z01810 Encounter for preprocedural cardiovascular examination: Secondary | ICD-10-CM | POA: Diagnosis present

## 2017-05-05 DIAGNOSIS — I10 Essential (primary) hypertension: Secondary | ICD-10-CM | POA: Insufficient documentation

## 2017-05-05 NOTE — Patient Instructions (Signed)
Your procedure is scheduled on: Tuesday, Oct. 30, 2018 Report to Same Day surgery. To find out your arrival time please call [phone] between 1PM - 3PM on Monday Oct. 29, 2018.  Remember: Instructions that are not followed completely may result in serious medical risk, up to and including death, or upon the discretion of your surgeon and anesthesiologist your surgery may need to be rescheduled.     _X__ 1. Do not eat food after midnight the night before your procedure.                 No gum chewing or hard candies. You may drink clear liquids up to 2 hours                 before you are scheduled to arrive for your surgery- DO not drink clear                 liquids within 2 hours of the start of your surgery.                 Clear Liquids include:  water, apple juice without pulp, clear carbohydrate                 drink such as Clearfast of Gartorade, Black Coffee or Tea (Do not add                 anything to coffee or tea).     ___ 2.  No Alcohol for 24 hours before or after surgery.   ___ 3.  Do Not Smoke or use e-cigarettes For 24 Hours Prior to Your Surgery.                 Do not use any chewable tobacco products for at least 6 hours prior to                 surgery.  ____  4.  Bring all medications with you on the day of surgery if instructed.   __x__  5.  Notify your doctor if there is any change in your medical condition      (cold, fever, infections).     Do not wear jewelry, make-up, hairpins, clips or nail polish. Do not wear lotions, powders, or perfumes. You may wear deodorant. Do not shave 48 hours prior to surgery. Men may shave face and neck. Do not bring valuables to the hospital.    [facility] is not responsible for any belongings or valuables.  Contacts, dentures or bridgework may not be worn into surgery. Leave your suitcase in the car. After surgery it may be brought to your room. For patients admitted to the hospital, discharge  time is determined by your treatment team.   Patients discharged the day of surgery will not be allowed to drive home.   Please read over the following fact sheets that you were given:     _x___ Take these medicines the morning of surgery with A SIP OF WATER:    1. diltiazem (CARDIZEM CD)     ____ Fleet Enema (as directed)   ____ Use CHG Soap as directed  ____ Use inhalers on the day of surgery  ____ Stop metformin 2 days prior to surgery    ____ Take 1/2 of usual insulin dose the night before surgery. No insulin the morning          of surgery.   ____ Stopped  Coumadin on Oct. 24, 2018 per Dr.  Callwood.  ____ Stop Anti-inflammatories on    ____ Stop supplements until after surgery.    _x___ Bring C-Pap to the hospital.

## 2017-05-05 NOTE — Pre-Procedure Instructions (Addendum)
During pt PAT visit, pt informed this RN that he stopped both his Coumadin and Aspirin yesterday 05/04/17.  Per Dr. Tonette Bihari instructions pt was to stop Coumadin, but not the Aspirin.  Instructed pt to restart the aspirin to prevent blood clots and Pulmonary embolus, pt verbalized understanding.  Medical clearance in front of pt's chart.

## 2017-05-09 ENCOUNTER — Telehealth: Payer: Self-pay

## 2017-05-09 NOTE — Telephone Encounter (Signed)
Patient notified that surgery for 05-10-17 would for his safety, unfortunately have to be rescheduled due to Dr. Erlene Quan being sick.  Patient was notified to restart his coumadin today and stop again on Friday, surgery has been rescheduled to Wednesday 05-18-17. Patient verbalized understanding and is in agreement with this plan.

## 2017-05-18 ENCOUNTER — Ambulatory Visit: Payer: Medicare Other | Admitting: Anesthesiology

## 2017-05-18 ENCOUNTER — Ambulatory Visit
Admission: RE | Admit: 2017-05-18 | Discharge: 2017-05-18 | Disposition: A | Discharge status: Home / Self Care | Payer: Medicare Other | Source: Ambulatory Visit | Attending: Urology | Admitting: Urology

## 2017-05-18 ENCOUNTER — Encounter
Admission: RE | Disposition: A | Discharge status: Home / Self Care | Payer: Self-pay | Source: Ambulatory Visit | Attending: Urology

## 2017-05-18 ENCOUNTER — Encounter: Payer: Self-pay | Admitting: Emergency Medicine

## 2017-05-18 DIAGNOSIS — N281 Cyst of kidney, acquired: Secondary | ICD-10-CM | POA: Diagnosis not present

## 2017-05-18 DIAGNOSIS — Z7982 Long term (current) use of aspirin: Secondary | ICD-10-CM | POA: Diagnosis not present

## 2017-05-18 DIAGNOSIS — Z87891 Personal history of nicotine dependence: Secondary | ICD-10-CM | POA: Insufficient documentation

## 2017-05-18 DIAGNOSIS — E7800 Pure hypercholesterolemia, unspecified: Secondary | ICD-10-CM | POA: Diagnosis not present

## 2017-05-18 DIAGNOSIS — N3289 Other specified disorders of bladder: Secondary | ICD-10-CM | POA: Diagnosis not present

## 2017-05-18 DIAGNOSIS — Z79899 Other long term (current) drug therapy: Secondary | ICD-10-CM | POA: Insufficient documentation

## 2017-05-18 DIAGNOSIS — Z08 Encounter for follow-up examination after completed treatment for malignant neoplasm: Secondary | ICD-10-CM | POA: Diagnosis present

## 2017-05-18 DIAGNOSIS — C674 Malignant neoplasm of posterior wall of bladder: Secondary | ICD-10-CM | POA: Insufficient documentation

## 2017-05-18 DIAGNOSIS — Z86711 Personal history of pulmonary embolism: Secondary | ICD-10-CM | POA: Diagnosis not present

## 2017-05-18 DIAGNOSIS — Z8551 Personal history of malignant neoplasm of bladder: Secondary | ICD-10-CM

## 2017-05-18 DIAGNOSIS — I4891 Unspecified atrial fibrillation: Secondary | ICD-10-CM | POA: Diagnosis not present

## 2017-05-18 DIAGNOSIS — I10 Essential (primary) hypertension: Secondary | ICD-10-CM | POA: Diagnosis not present

## 2017-05-18 DIAGNOSIS — E119 Type 2 diabetes mellitus without complications: Secondary | ICD-10-CM | POA: Insufficient documentation

## 2017-05-18 DIAGNOSIS — J449 Chronic obstructive pulmonary disease, unspecified: Secondary | ICD-10-CM | POA: Insufficient documentation

## 2017-05-18 DIAGNOSIS — G4730 Sleep apnea, unspecified: Secondary | ICD-10-CM | POA: Insufficient documentation

## 2017-05-18 DIAGNOSIS — Z7901 Long term (current) use of anticoagulants: Secondary | ICD-10-CM | POA: Insufficient documentation

## 2017-05-18 DIAGNOSIS — Z8673 Personal history of transient ischemic attack (TIA), and cerebral infarction without residual deficits: Secondary | ICD-10-CM | POA: Insufficient documentation

## 2017-05-18 DIAGNOSIS — Z9989 Dependence on other enabling machines and devices: Secondary | ICD-10-CM | POA: Diagnosis not present

## 2017-05-18 HISTORY — DX: Family history of other specified conditions: Z84.89

## 2017-05-18 HISTORY — DX: Prediabetes: R73.03

## 2017-05-18 HISTORY — PX: CYSTOSCOPY WITH BIOPSY: SHX5122

## 2017-05-18 HISTORY — PX: CYSTOSCOPY W/ RETROGRADES: SHX1426

## 2017-05-18 HISTORY — PX: URETEROSCOPY: SHX842

## 2017-05-18 LAB — PROTIME-INR
INR: 1.16
PROTHROMBIN TIME: 14.7 s (ref 11.4–15.2)

## 2017-05-18 LAB — GLUCOSE, CAPILLARY
Glucose-Capillary: 105 mg/dL — ABNORMAL HIGH (ref 65–99)
Glucose-Capillary: 106 mg/dL — ABNORMAL HIGH (ref 65–99)

## 2017-05-18 SURGERY — CYSTOSCOPY, WITH BIOPSY
Anesthesia: General | Site: Ureter | Wound class: Clean Contaminated

## 2017-05-18 MED ORDER — DEXAMETHASONE SODIUM PHOSPHATE 10 MG/ML IJ SOLN
INTRAMUSCULAR | Status: AC
  Filled 2017-05-18: qty 1

## 2017-05-18 MED ORDER — FENTANYL CITRATE (PF) 100 MCG/2ML IJ SOLN: 25.0000 ug | INTRAMUSCULAR | Status: DC | PRN

## 2017-05-18 MED ORDER — FAMOTIDINE 20 MG PO TABS
20.0000 mg | ORAL_TABLET | Freq: Once | ORAL | Status: AC
  Administered 2017-05-18: 20 mg via ORAL

## 2017-05-18 MED ORDER — PROPOFOL 10 MG/ML IV BOLUS
INTRAVENOUS | Status: DC | PRN
  Administered 2017-05-18: 190 mg via INTRAVENOUS

## 2017-05-18 MED ORDER — PROPOFOL 10 MG/ML IV BOLUS
INTRAVENOUS | Status: AC
  Filled 2017-05-18: qty 20

## 2017-05-18 MED ORDER — LIDOCAINE HCL (CARDIAC) 20 MG/ML IV SOLN
INTRAVENOUS | Status: DC | PRN
  Administered 2017-05-18: 100 mg via INTRAVENOUS

## 2017-05-18 MED ORDER — PHENYLEPHRINE HCL 10 MG/ML IJ SOLN
INTRAMUSCULAR | Status: DC | PRN
  Administered 2017-05-18 (×4): 100 ug via INTRAVENOUS

## 2017-05-18 MED ORDER — LACTATED RINGERS IV SOLN: INTRAVENOUS | Status: DC | PRN

## 2017-05-18 MED ORDER — FENTANYL CITRATE (PF) 100 MCG/2ML IJ SOLN
INTRAMUSCULAR | Status: DC | PRN
  Administered 2017-05-18 (×2): 50 ug via INTRAVENOUS

## 2017-05-18 MED ORDER — ONDANSETRON HCL 4 MG/2ML IJ SOLN: 4.0000 mg | Freq: Once | INTRAMUSCULAR | Status: DC | PRN

## 2017-05-18 MED ORDER — DOCUSATE SODIUM 100 MG PO CAPS: 100.0000 mg | ORAL_CAPSULE | Freq: Two times a day (BID) | ORAL | 0 refills | Status: DC | PRN

## 2017-05-18 MED ORDER — CEFAZOLIN SODIUM-DEXTROSE 2-4 GM/100ML-% IV SOLN
INTRAVENOUS | Status: AC
  Filled 2017-05-18: qty 100

## 2017-05-18 MED ORDER — DEXAMETHASONE SODIUM PHOSPHATE 10 MG/ML IJ SOLN
INTRAMUSCULAR | Status: DC | PRN
  Administered 2017-05-18: 5 mg via INTRAVENOUS

## 2017-05-18 MED ORDER — FAMOTIDINE 20 MG PO TABS
ORAL_TABLET | ORAL | Status: AC
  Administered 2017-05-18: 20 mg via ORAL
  Filled 2017-05-18: qty 1

## 2017-05-18 MED ORDER — ONDANSETRON HCL 4 MG/2ML IJ SOLN
INTRAMUSCULAR | Status: DC | PRN
  Administered 2017-05-18: 4 mg via INTRAVENOUS

## 2017-05-18 MED ORDER — ONDANSETRON HCL 4 MG/2ML IJ SOLN
INTRAMUSCULAR | Status: AC
  Filled 2017-05-18: qty 2

## 2017-05-18 MED ORDER — FENTANYL CITRATE (PF) 100 MCG/2ML IJ SOLN
INTRAMUSCULAR | Status: AC
  Filled 2017-05-18: qty 2

## 2017-05-18 MED ORDER — LIDOCAINE HCL (PF) 2 % IJ SOLN
INTRAMUSCULAR | Status: AC
  Filled 2017-05-18: qty 10

## 2017-05-18 MED ORDER — SODIUM CHLORIDE 0.9 % IV SOLN
INTRAVENOUS | Status: DC
  Administered 2017-05-18: 12:00:00 via INTRAVENOUS

## 2017-05-18 MED ORDER — SEVOFLURANE IN SOLN
RESPIRATORY_TRACT | Status: AC
  Filled 2017-05-18: qty 250

## 2017-05-18 MED ORDER — IOTHALAMATE MEGLUMINE 43 % IV SOLN
INTRAVENOUS | Status: DC | PRN
  Administered 2017-05-18: 15 mL via URETHRAL

## 2017-05-18 MED ORDER — SUGAMMADEX SODIUM 200 MG/2ML IV SOLN
INTRAVENOUS | Status: AC
  Filled 2017-05-18: qty 2

## 2017-05-18 MED ORDER — LACTATED RINGERS IV SOLN
INTRAVENOUS | Status: DC | PRN
  Administered 2017-05-18: 12:00:00 via INTRAVENOUS

## 2017-05-18 MED ORDER — HYDROCODONE-ACETAMINOPHEN 5-325 MG PO TABS: 1.0000 | ORAL_TABLET | Freq: Four times a day (QID) | ORAL | 0 refills | Status: DC | PRN

## 2017-05-18 MED ORDER — CEFAZOLIN SODIUM-DEXTROSE 2-4 GM/100ML-% IV SOLN
2.0000 g | Freq: Once | INTRAVENOUS | Status: AC
  Administered 2017-05-18: 2 g via INTRAVENOUS

## 2017-05-18 SURGICAL SUPPLY — 27 items
BAG DRAIN CYSTO-URO LG1000N (MISCELLANEOUS) ×5 IMPLANT
CATH URETL 5X70 OPEN END (CATHETERS) ×5 IMPLANT
CONRAY 43 FOR UROLOGY 50M (MISCELLANEOUS) ×5 IMPLANT
DRAPE UTILITY 15X26 TOWEL STRL (DRAPES) ×5 IMPLANT
DRSG TELFA 4X3 1S NADH ST (GAUZE/BANDAGES/DRESSINGS) ×5 IMPLANT
ELECT REM PT RETURN 9FT ADLT (ELECTROSURGICAL) ×5
ELECTRODE REM PT RTRN 9FT ADLT (ELECTROSURGICAL) ×3 IMPLANT
GLOVE BIO SURGEON STRL SZ 6.5 (GLOVE) ×4 IMPLANT
GLOVE BIO SURGEONS STRL SZ 6.5 (GLOVE) ×1
GOWN SRG XL LVL 3 NONREINFORCE (GOWNS) ×3 IMPLANT
GOWN STRL NON-REIN TWL XL LVL3 (GOWNS) ×2
GOWN STRL REUS W/ TWL LRG LVL3 (GOWN DISPOSABLE) ×6 IMPLANT
GOWN STRL REUS W/TWL LRG LVL3 (GOWN DISPOSABLE) ×4
KIT RM TURNOVER CYSTO AR (KITS) ×5 IMPLANT
NDL SAFETY ECLIPSE 18X1.5 (NEEDLE) ×3 IMPLANT
NEEDLE HYPO 18GX1.5 SHARP (NEEDLE) ×2
PACK CYSTO AR (MISCELLANEOUS) ×5 IMPLANT
SCRUB POVIDONE IODINE 4 OZ (MISCELLANEOUS) ×5 IMPLANT
SENSORWIRE 0.038 NOT ANGLED (WIRE) ×5
SET CYSTO W/LG BORE CLAMP LF (SET/KITS/TRAYS/PACK) ×5 IMPLANT
SOL .9 NS 3000ML IRR  AL (IV SOLUTION)
SOL .9 NS 3000ML IRR UROMATIC (IV SOLUTION) IMPLANT
SURGILUBE 2OZ TUBE FLIPTOP (MISCELLANEOUS) ×5 IMPLANT
WATER STERILE IRR 1000ML POUR (IV SOLUTION) ×5 IMPLANT
WATER STERILE IRR 3000ML UROMA (IV SOLUTION) ×5 IMPLANT
WIRE AMPLATZ SSTIFF .035X260CM (WIRE) ×5 IMPLANT
WIRE SENSOR 0.038 NOT ANGLED (WIRE) ×3 IMPLANT

## 2017-05-18 NOTE — Anesthesia Preprocedure Evaluation (Signed)
Anesthesia Evaluation  Patient identified by MRN, date of birth, ID band Patient awake    Reviewed: Allergy & Precautions, NPO status , Patient's Chart, lab work & pertinent test results  History of Anesthesia Complications (+) Family history of anesthesia reaction  Airway Mallampati: III       Dental   Pulmonary sleep apnea and Continuous Positive Airway Pressure Ventilation , COPD,  COPD inhaler, former smoker,           Cardiovascular hypertension, Pt. on medications (-) Past MI and (-) CHF (-) dysrhythmias (-) Valvular Problems/Murmurs     Neuro/Psych CVA (lip droop only), No Residual Symptoms    GI/Hepatic Neg liver ROS, neg GERD  ,  Endo/Other  diabetes (borderline)  Renal/GU Renal InsufficiencyRenal disease     Musculoskeletal   Abdominal   Peds  Hematology  (+) anemia ,   Anesthesia Other Findings   Reproductive/Obstetrics                             Anesthesia Physical Anesthesia Plan  ASA: III  Anesthesia Plan: General   Post-op Pain Management:    Induction: Intravenous  PONV Risk Score and Plan: 2  Airway Management Planned: LMA  Additional Equipment:   Intra-op Plan:   Post-operative Plan:   Informed Consent: I have reviewed the patients History and Physical, chart, labs and discussed the procedure including the risks, benefits and alternatives for the proposed anesthesia with the patient or authorized representative who has indicated his/her understanding and acceptance.     Plan Discussed with:   Anesthesia Plan Comments:         Anesthesia Quick Evaluation

## 2017-05-18 NOTE — Transfer of Care (Signed)
Immediate Anesthesia Transfer of Care Note  Patient: [name] Hardin  Procedure(s) Performed: CYSTOSCOPY WITH BIOPSY (N/A Bladder) CYSTOSCOPY WITH RETROGRADE PYELOGRAM (Bilateral Ureter) URETEROSCOPY (Left Ureter)  Patient Location: PACU  Anesthesia Type:General  Level of Consciousness: drowsy and patient cooperative  Airway & Oxygen Therapy: Patient Spontanous Breathing and Patient connected to face mask oxygen  Post-op Assessment: Report given to RN, Post -op Vital signs reviewed and stable and Patient moving all extremities X 4  Post vital signs: Reviewed and stable  Last Vitals:  Vitals:   05/18/17 1120 05/18/17 1258  BP:  122/69  Pulse:  68  Resp:  14  Temp: (!) 35.6 C 36.6 C  SpO2:  98%    Last Pain:  Vitals:   05/18/17 1120  TempSrc: Tympanic         Complications: No apparent anesthesia complications

## 2017-05-18 NOTE — Anesthesia Procedure Notes (Signed)
Procedure Name: LMA Insertion Date/Time: 05/18/2017 12:15 PM Performed by: Silvana Newness, CRNA Pre-anesthesia Checklist: Patient identified, Patient being monitored, Timeout performed, Emergency Drugs available and Suction available Patient Re-evaluated:Patient Re-evaluated prior to induction Oxygen Delivery Method: Circle system utilized Preoxygenation: Pre-oxygenation with 100% oxygen Induction Type: IV induction Ventilation: Mask ventilation without difficulty LMA: LMA inserted LMA Size: 5.0 Tube type: Oral Number of attempts: 1 Placement Confirmation: positive ETCO2 and breath sounds checked- equal and bilateral Tube secured with: Tape Dental Injury: Teeth and Oropharynx as per pre-operative assessment

## 2017-05-18 NOTE — Discharge Instructions (Signed)
Transurethral Resection of Bladder Tumor (TURBT) or Bladder Biopsy   Definition:  Transurethral Resection of the Bladder Tumor is a surgical procedure used to diagnose and remove tumors within the bladder. TURBT is the most common treatment for early stage bladder cancer.  General instructions:     Your recent bladder surgery requires very little post hospital care but some definite precautions.  Despite the fact that no skin incisions were used, the area around the bladder incisions are raw and covered with scabs to promote healing and prevent bleeding. Certain precautions are needed to insure that the scabs are not disturbed over the next 2-4 weeks while the healing proceeds.  Because the raw surface inside your bladder and the irritating effects of urine you may expect frequency of urination and/or urgency (a stronger desire to urinate) and perhaps even getting up at night more often. This will usually resolve or improve slowly over the healing period. You may see some blood in your urine over the first 6 weeks. Do not be alarmed, even if the urine was clear for a while. Get off your feet and drink lots of fluids until clearing occurs. If you start to pass clots or don't improve call us.  Diet:  You may return to your normal diet immediately. Because of the raw surface of your bladder, alcohol, spicy foods, foods high in acid and drinks with caffeine may cause irritation or frequency and should be used in moderation. To keep your urine flowing freely and avoid constipation, drink plenty of fluids during the day (8-10 glasses). Tip: Avoid cranberry juice because it is very acidic.  Activity:  Your physical activity doesn't need to be restricted. However, if you are very active, you may see some blood in the urine. We suggest that you reduce your activity under the circumstances until the bleeding has stopped.  Bowels:  It is important to keep your bowels regular during the postoperative  period. Straining with bowel movements can cause bleeding. A bowel movement every other day is reasonable. Use a mild laxative if needed, such as milk of magnesia 2-3 tablespoons, or 2 Dulcolax tablets. Call if you continue to have problems. If you had been taking narcotics for pain, before, during or after your surgery, you may be constipated. Take a laxative if necessary.    Medication:  You should resume your pre-surgery medications unless told not to. In addition you may be given an antibiotic to prevent or treat infection. Antibiotics are not always necessary. All medication should be taken as prescribed until the bottles are finished unless you are having an unusual reaction to one of the drugs.   [facility] [address] [place], [place] [address] [phone]   AMBULATORY SURGERY  DISCHARGE INSTRUCTIONS   1) The drugs that you were given will stay in your system until tomorrow so for the next 24 hours you should not:  A) Drive an automobile B) Make any legal decisions C) Drink any alcoholic beverage   2) You may resume regular meals tomorrow.  Today it is better to start with liquids and gradually work up to solid foods.  You may eat anything you prefer, but it is better to start with liquids, then soup and crackers, and gradually work up to solid foods.   3) Please notify your doctor immediately if you have any unusual bleeding, trouble breathing, redness and pain at the surgery site, drainage, fever, or pain not relieved by medication.  4) Additional Instructions:   Please contact your physician with any problems or Same Day Surgery at [phone], Monday through Friday 6 am to 4 pm, or [place] at [facility] number at [phone].

## 2017-05-18 NOTE — Op Note (Signed)
Date of procedure: 05/18/17  Preoperative diagnosis:  1. History of bladder cancer 2. Right bladder wall erythema  Postoperative diagnosis:  1. As above  Procedure: 1. Cystoscopy  2. Bilateral retrograde pyelogram 3. Left diagnostic ureteroscopy 4. Bladder biopsy  Surgeon: Hollice Espy, MD  Anesthesia: General  Complications: None  Intraoperative findings: Trabeculated bladder with mildly erythematous right bladder wall lesion measuring approximately 1 cm in diameter.  No obvious papillary tumor.  Left upper pole filling defect, negative diagnostic ureteroscopy.  EBL: Minimal  Specimens: Right lateral bladder wall erythema  Drains: None  Indication: [name] Hardin is a 83 y.o. patient with personal history of bladder cancer with an area on the right lateral wall with progressive erythema.  After reviewing the management options for treatment, he elected to proceed with the above surgical procedure(s). We have discussed the potential benefits and risks of the procedure, side effects of the proposed treatment, the likelihood of the patient achieving the goals of the procedure, and any potential problems that might occur during the procedure or recuperation. Informed consent has been obtained.  Description of procedure:  The patient was taken to the operating room and general anesthesia was induced.  The patient was placed in the dorsal lithotomy position, prepped and draped in the usual sterile fashion, and preoperative antibiotics were administered. A preoperative time-out was performed.   A 21 French cystoscope was advanced per urethra into the bladder.  Of note his prostate was enlarged and somewhat friable.  The bladder was mildly trabeculated.  The trigone was in normal anatomic position.  There is some patchy erythema on the right lateral wall without any obvious frondular change.  It was somewhat edematous in appearance.  This measured approximately 1 cm in diameter.   There are no other lesions in the bladder.  Attention was first turned to the left ureteral orifice which was cannulated using a 5 [place] open-ended ureteral catheter.  Gentle retrograde pyelogram was performed by injecting contrast.  This revealed a decompressed ureter however there was a filling defect measuring at least 1 cm in the upper pole calyx which is somewhat concerning.  Additional contrast was injected in time was allowed to see if this area cleared.  The lesion remained in place concerning for possible upper tract lesion.  It a very spherical appearance.  Attention was then turned to the right ureteral orifice.  Contrast was injected and this was unremarkable.  There is no filling defects or ureteral lesions.  At this point in time, given his history of bladder cancer and concern for left upper tract filling defect, the decision was made to proceed with diagnostic ureteroscopy.  A Super Stiff wire was advanced up to the level of the kidney under fluoroscopic guidance.  A 7 French single channel Wolf ureteroscope was then advanced over the wire up to the level of the kidney without difficulty.  Upper tract collecting system was then carefully inspected and there is no evidence of filling defects, the filling defect was presumably related to an air bubble.  Repeat retrograde pyelogram through the scope showed resolution of this defect.  All additional calyces were directly visualized and were unremarkable.  Upon withdrawal of the scope, the remainder of the ureter was also inspected and there were no abnormalities.  The contrast drained promptly.  An uncomplicated ureteroscopy, no stent was placed at this time.  Next, cold cup biopsy forceps were used to biopsy the erythematous patch on the right posterior bladder wall.  Several biopsies  were taken.  These were passed off the field.  The remainder of the suspicious area was fulgurated using Bugbee electrocautery.  After adequate hemostasis was  achieved, the bladder was drained and the scope was removed.  The patient was then cleaned and dried, repositioned in supine position, reversed from anesthesia, taken the PACU in stable condition.  Plan: Findings were discussed with the patient and his family.  All questions were answered.  We will call him with his pathology results and determine his follow-up plan based on these pathology findings.  Hollice Espy, M.D.

## 2017-05-18 NOTE — Anesthesia Post-op Follow-up Note (Signed)
Anesthesia QCDR form completed.        

## 2017-05-18 NOTE — Interval H&P Note (Signed)
History and Physical Interval Note:  05/18/2017 11:51 AM  [name] Hardin  has presented today for surgery, with the diagnosis of bladder lesion  The various methods of treatment have been discussed with the patient and family. After consideration of risks, benefits and other options for treatment, the patient has consented to  Procedure(s): CYSTOSCOPY WITH BIOPSY (N/A) CYSTOSCOPY WITH RETROGRADE PYELOGRAM (Bilateral) as a surgical intervention .  The patient's history has been reviewed, patient examined, no change in status, stable for surgery.  I have reviewed the patient's chart and labs.  Questions were answered to the patient's satisfaction.    RRR CTAB  Hollice Espy

## 2017-05-18 NOTE — Anesthesia Postprocedure Evaluation (Signed)
Anesthesia Post Note  Patient: [name] Hardin  Procedure(s) Performed: CYSTOSCOPY WITH BIOPSY (N/A Bladder) CYSTOSCOPY WITH RETROGRADE PYELOGRAM (Bilateral Ureter) URETEROSCOPY (Left Ureter)  Patient location during evaluation: PACU Anesthesia Type: General Level of consciousness: awake and alert Pain management: pain level controlled Vital Signs Assessment: post-procedure vital signs reviewed and stable Respiratory status: spontaneous breathing and respiratory function stable Cardiovascular status: stable Anesthetic complications: no     Last Vitals:  Vitals:   05/18/17 1338 05/18/17 1349  BP: 132/78 126/65  Pulse: 68 (!) 58  Resp: 12 16  Temp: 36.4 C 36.6 C  SpO2: 96% 94%    Last Pain:  Vitals:   05/18/17 1349  TempSrc: Oral                 Maddie Brazier K

## 2017-05-20 LAB — SURGICAL PATHOLOGY

## 2017-05-24 ENCOUNTER — Encounter: Payer: Self-pay | Admitting: Urology

## 2017-05-24 ENCOUNTER — Ambulatory Visit (INDEPENDENT_AMBULATORY_CARE_PROVIDER_SITE_OTHER): Payer: Medicare Other | Admitting: Urology

## 2017-05-24 VITALS — BP 104/65 | HR 81 | Ht 71.0 in | Wt 294.4 lb

## 2017-05-24 DIAGNOSIS — C672 Malignant neoplasm of lateral wall of bladder: Secondary | ICD-10-CM | POA: Diagnosis not present

## 2017-05-24 NOTE — Progress Notes (Signed)
05/24/2017 5:22 PM   [name] Hardin January 08, 1932 [id number]  Referring provider: [name], MD [facility] [place], [place] [address]  Chief Complaint  Patient presents with  . Results    HPI: 83 yo M with history of bladder cancer with recent recurrence who returns today for pathology discussion following TURBT/ B RTG/ L URS (diagnostic, negative).    CT urogram on 07/02/2014 showed a 16 mm bladder stone as well as a 1 cm area on the anterior wall of the bladder suspicious for bladder carcinoma. He was taken to the operating room on 1/227/2016 for cystolitholapaxy and TURBT.At this time, the stone was completely fragmented and removed and as small 1 cm well-circumscribed frondular tumor was noted and the LEFT anterior wall of the bladder. 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   He completed BCG x 6 (induction course) on 10/2014.  Most recent upper tract imaging on 2/18 --> RUS negative other than bilateral bosniak I renal cyst  Cystoscopy 2/18 negative for recurrence, cytology negative.  Cysto 2 months ago however showed fine papillary change on the right lateral wall concerning for either early recurrence versus edema.  He returned to the operating room on 05/18/2017 and underwent biopsy of this lesion.  Additionally, he underwent bilateral retrogrades.  It was a filling defect, likely an air bubble in the left collecting system and had a negative diagnostic ureteroscopy.  Surgical pathology consistent with high-grade TA disease.  Today, he is doing quite well.  He had a few days of hematuria which is since resolved.  He has no urinary complaints.  No flank pain.  No fevers or chills.  Smoking history of 3ppd x >26yrs. Quit 13 years ago.  PMH: Past Medical History:  Diagnosis Date  . ATRIAL  FIBRILLATION 03/05/2010   Qualifier: Diagnosis of  By: Lovena Le, MD, Martyn Malay   . Benign hypertension 12/16/2013  . Chronic obstructive pulmonary disease ([place]) 12/16/2013  . COPD (chronic obstructive pulmonary disease) ([place])   . Family history of adverse reaction to anesthesia    Brother has a difficult time waking up.  Marland Kitchen Hx pulmonary embolism 08/2000  . Hypercholesterolemia   . Hyperglycemia   . Hypertension   . Osteoarthritis   . Pre-diabetes   . PREMATURE VENTRICULAR CONTRACTIONS 03/05/2010   Qualifier: Diagnosis of  By: Lovena Le, MD, Martyn Malay   . Pulmonary embolism ([place]) 12/16/2013  . Stroke [facility]) 1997    Surgical History: Past Surgical History:  Procedure Laterality Date  . JOINT REPLACEMENT Right   . TONSILLECTOMY      Home Medications:  Allergies as of 05/24/2017   No Known Allergies     Medication List        Accurate as of 05/24/17  5:22 PM. Always use your most recent med list.          ADVAIR DISKUS 250-50 MCG/DOSE Aepb Generic drug:  Fluticasone-Salmeterol Inhale 1 puff into the lungs 2 (two) times daily.   allopurinol 300 MG tablet Commonly known as:  ZYLOPRIM Take 300 mg by mouth daily. In am   aspirin 81 MG tablet Take 81 mg by mouth daily.   COLCRYS 0.6 MG tablet Generic drug:  colchicine Take 0.6 mg by mouth every other day.   diltiazem 240 MG 24 hr capsule Commonly known as:  CARDIZEM CD Take 240 mg by mouth daily. In am   docusate sodium 100 MG capsule Commonly known as:  COLACE Take 1 capsule (100 mg total) 2 (two) times daily as needed by mouth (take to keep stool soft.).   furosemide 20 MG tablet Commonly known as:  LASIX Take 20 mg by mouth daily. In am   HYDROcodone-acetaminophen 5-325 MG tablet Commonly known as:  NORCO/VICODIN Take 1-2 tablets every 6 (six) hours as needed by mouth for moderate pain.   MULTI-DAY Tabs Take 1 tablet by mouth daily. In am   potassium chloride 10 MEQ CR tablet Commonly known as:   KLOR-CON Take 10 mEq by mouth daily. In am   simvastatin 40 MG tablet Commonly known as:  ZOCOR Take 40 mg by mouth every morning.   SPIRIVA HANDIHALER 18 MCG inhalation capsule Generic drug:  tiotropium Place 18 mcg into inhaler and inhale daily. In am   warfarin 5 MG tablet Commonly known as:  COUMADIN Take 5 mg by mouth daily at 6 PM.       Allergies: No Known Allergies  Family History: Family History  Problem Relation Age of Onset  . Prostate cancer Neg Hx   . Kidney cancer Neg Hx     Social History:  reports that he quit smoking about 26 years ago. he has never used smokeless tobacco. He reports that he does not drink alcohol or use drugs.  ROS: 12 point review of systems negative other than as per HPI.  Physical Exam: BP 104/65 (BP Location: Left Arm, Patient Position: Sitting, Cuff Size: Large)   Pulse 81   Ht 5\' 11"  (1.803 m)   Wt 294 lb 6.4 oz (133.5 kg)   BMI 41.06 kg/m   Constitutional:  Alert and oriented, No acute distress.  Accompanied by wife today. HEENT: [place] AT, moist mucus membranes.  Trachea midline, no masses. Cardiovascular: No clubbing, cyanosis, or edema. Respiratory: Normal respiratory effort, no increased work of breathing. GI: Abdomen is soft, nontender, nondistended, no abdominal masses GU: No CVA tenderness.  Neurologic: Grossly intact, no focal deficits, moving all 4 extremities. Psychiatric: Normal mood and affect.  Laboratory Data: Lab Results  Component Value Date   WBC 8.8 08/07/2014   HGB 14.7 08/07/2014   HCT 44.8 08/07/2014   MCV 97 08/07/2014   PLT 152 08/07/2014    Lab Results  Component Value Date   CREATININE 1.44 (H) 08/07/2014   Urinalysis N/a  Pertinent Imaging: N/a  Assessment & Plan:    1. 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  Hollice Espy, MD  [facility] [address] [place], [place] [address] [phone]

## 2017-06-03 NOTE — Progress Notes (Addendum)
06/06/2017 4:31 PM   [name] Hardin [birth year] [id number]  Referring provider: [name], MD [place] [facility] [place], [place] [address]  Chief Complaint  Patient presents with  . BCG    #1    HPI: 83 yo WM with history of bladder cancer who presents today to begin an induction course of BCG.  This will be 1 of 6.    CT urogram on 07/02/2014 showed a 16 mm bladder stone as well as a 1 cm area on the anterior wall of the bladder suspicious for bladder carcinoma. He was taken to the operating room on 1/227/2016 for cystolitholapaxy and TURBT.At this time, the stone was completely fragmented and removed and as small 1 cm well-circumscribed frondular tumor was noted and the LEFT anterior wall of the bladder. 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   He completed BCG x 6 (induction course) on 10/2014.  Most recent upper tract imaging on 2/18 --> RUS negative other than bilateral bosniak I renal cyst  Cystoscopy 2/18 negative for recurrence, cytology negative.  Cysto 2 months ago however showed fine papillary change on the right lateral wall concerning for either early recurrence versus edema.  He returned to the operating room on 05/18/2017 and underwent biopsy of this lesion.  Additionally, he underwent bilateral retrogrades.  It was a filling defect, likely an air bubble in the left collecting system and had a negative diagnostic ureteroscopy.  Surgical pathology consistent with high-grade TA disease.  Smoking history of 3ppd x >70yrs. Quit 13 years ago.  His UA today is positive for > 30 WBC's, 11-30 RBC's and moderate bacteria.  PMH: Past Medical History:  Diagnosis Date  . ATRIAL FIBRILLATION 03/05/2010   Qualifier: Diagnosis of  By: [name] Le, MD, [name] Hardin   . Benign hypertension  12/16/2013  . Chronic obstructive pulmonary disease ([place]) 12/16/2013  . COPD (chronic obstructive pulmonary disease) ([place])   . Family history of adverse reaction to anesthesia    Brother has a difficult time waking up.  Marland Kitchen Hx pulmonary embolism 08/2000  . Hypercholesterolemia   . Hyperglycemia   . Hypertension   . Osteoarthritis   . Pre-diabetes   . PREMATURE VENTRICULAR CONTRACTIONS 03/05/2010   Qualifier: Diagnosis of  By: [name] Le, MD, [name] Hardin   . Pulmonary embolism ([place]) 12/16/2013  . Stroke [facility]) 1997    Surgical History: Past Surgical History:  Procedure Laterality Date  . CYSTOSCOPY W/ RETROGRADES Bilateral 05/18/2017   Procedure: CYSTOSCOPY WITH RETROGRADE PYELOGRAM;  Surgeon: [name] Espy, MD;  Location: [facility] ORS;  Service: Urology;  Laterality: Bilateral;  . CYSTOSCOPY WITH BIOPSY N/A 05/18/2017   Procedure: CYSTOSCOPY WITH BIOPSY;  Surgeon: [name] Espy, MD;  Location: [facility] ORS;  Service: Urology;  Laterality: N/A;  . JOINT REPLACEMENT Right   . PERIPHERAL VASCULAR CATHETERIZATION N/A 02/04/2015   Procedure: IVC Filter Removal;  Surgeon: Katha Cabal, MD;  Location: [place] CV LAB;  Service: Cardiovascular;  Laterality: N/A;  . TONSILLECTOMY    . URETEROSCOPY Left 05/18/2017   Procedure: URETEROSCOPY;  Surgeon: [name] Espy, MD;  Location: [facility] ORS;  Service: Urology;  Laterality: Left;    Home Medications:  Allergies as of 06/06/2017   No Known Allergies     Medication List        Accurate as of 06/06/17 11:59 PM.  Always use your most recent med list.          ADVAIR DISKUS 250-50 MCG/DOSE Aepb Generic drug:  Fluticasone-Salmeterol Inhale 1 puff into the lungs 2 (two) times daily.   allopurinol 300 MG tablet Commonly known as:  ZYLOPRIM Take 300 mg by mouth daily. In am   aspirin 81 MG tablet Take 81 mg by mouth daily.   COLCRYS 0.6 MG tablet Generic drug:  colchicine Take 0.6 mg by mouth every other day.   diltiazem 240 MG 24  hr capsule Commonly known as:  CARDIZEM CD Take 240 mg by mouth daily. In am   docusate sodium 100 MG capsule Commonly known as:  COLACE Take 1 capsule (100 mg total) 2 (two) times daily as needed by mouth (take to keep stool soft.).   furosemide 20 MG tablet Commonly known as:  LASIX Take 20 mg by mouth daily. In am   HYDROcodone-acetaminophen 5-325 MG tablet Commonly known as:  NORCO/VICODIN Take 1-2 tablets every 6 (six) hours as needed by mouth for moderate pain.   MULTI-DAY Tabs Take 1 tablet by mouth daily. In am   potassium chloride 10 MEQ CR tablet Commonly known as:  KLOR-CON Take 10 mEq by mouth daily. In am   simvastatin 40 MG tablet Commonly known as:  ZOCOR Take 40 mg by mouth every morning.   SPIRIVA HANDIHALER 18 MCG inhalation capsule Generic drug:  tiotropium Place 18 mcg into inhaler and inhale daily. In am   warfarin 5 MG tablet Commonly known as:  COUMADIN Take 5 mg by mouth daily at 6 PM.       Allergies: No Known Allergies  Family History: Family History  Problem Relation Age of Onset  . Prostate cancer Neg Hx   . Kidney cancer Neg Hx     Social History:  reports that he quit smoking about 26 years ago. he has never used smokeless tobacco. He reports that he does not drink alcohol or use drugs.  ROS:       Physical Exam: There were no vitals taken for this visit.  Constitutional:  Alert and oriented, No acute distress.  Accompanied by wife today. HEENT: [name] AT, moist mucus membranes.  Trachea midline, no masses. Cardiovascular: No clubbing, cyanosis, or edema. Respiratory: Normal respiratory effort, no increased work of breathing. GI: Abdomen is soft, nontender, nondistended, no abdominal masses GU: No CVA tenderness.  Neurologic: Grossly intact, no focal deficits, moving all 4 extremities. Psychiatric: Normal mood and affect.  Laboratory Data: Urinalysis > 30 WBC's.  11-30 RBC's.  Moderate bacteria.  See Epic.  I have  reviewed the labs.   Assessment & Plan:    1. Malignant neoplasm of lateral wall of urinary bladder (HCC)  - Recurrent small-volume low-grade TA disease  - Reviewed BCG treatment course, possible side effects including BCG sepsis, bladder irritation, worsening of her urinary symptoms  - # 1 of 6 BCG NOT installed today - UA sent for culture  - RTC in one week for # 1 BCG  - Urinalysis, Complete    Return in about 1 week (around 06/13/2017) for to start BCG.  Zara Council, PA-C  [facility] [address], [place], [place] [address] [phone]

## 2017-06-06 ENCOUNTER — Ambulatory Visit (INDEPENDENT_AMBULATORY_CARE_PROVIDER_SITE_OTHER): Payer: Medicare Other | Admitting: Urology

## 2017-06-06 DIAGNOSIS — R3129 Other microscopic hematuria: Secondary | ICD-10-CM

## 2017-06-06 DIAGNOSIS — C679 Malignant neoplasm of bladder, unspecified: Secondary | ICD-10-CM

## 2017-06-06 LAB — URINALYSIS, COMPLETE
BILIRUBIN UA: NEGATIVE
GLUCOSE, UA: NEGATIVE
Nitrite, UA: NEGATIVE
SPEC GRAV UA: 1.03 (ref 1.005–1.030)
Urobilinogen, Ur: 1 mg/dL (ref 0.2–1.0)
pH, UA: 5.5 (ref 5.0–7.5)

## 2017-06-06 LAB — MICROSCOPIC EXAMINATION: WBC, UA: >30 /hpf — AB (ref 0–?)

## 2017-06-09 ENCOUNTER — Telehealth: Payer: Self-pay

## 2017-06-09 LAB — CULTURE, URINE COMPREHENSIVE

## 2017-06-09 NOTE — Telephone Encounter (Signed)
Spoke with pt in reference to -ucx and BCG. Pt voiced understanding.

## 2017-06-09 NOTE — Telephone Encounter (Signed)
-----   Message from Nori Riis, PA-C sent at 06/09/2017  8:30 AM EST ----- Please let Mr. Lubas know that his urine culture was negative.  We will proceed with BCG next week as scheduled.

## 2017-06-13 ENCOUNTER — Encounter: Payer: Self-pay | Admitting: Urology

## 2017-06-13 ENCOUNTER — Ambulatory Visit: Payer: Medicare Other | Admitting: Urology

## 2017-06-20 ENCOUNTER — Ambulatory Visit: Payer: Medicare Other | Admitting: Urology

## 2017-06-21 NOTE — Progress Notes (Signed)
06/22/2017 1:52 PM   [name] Hardin Jan 01, 1932 [id number]  Referring provider: [name], MD [place] [facility] [place], [place] [address]  Chief Complaint  Patient presents with  . Bladder Cancer    HPI: 83 yo WM with history of bladder cancer who presents today to begin an induction course of BCG.  This will be 1 of 6.    CT urogram on 07/02/2014 showed a 16 mm bladder stone as well as a 1 cm area on the anterior wall of the bladder suspicious for bladder carcinoma. He was taken to the operating room on 1/227/2016 for cystolitholapaxy and TURBT.Hardin this time, the stone was completely fragmented and removed and as small 1 cm well-circumscribed frondular tumor was noted and the LEFT anterior wall of the bladder. 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   He completed BCG x 6 (induction course) on 10/2014.  Most recent upper tract imaging on 2/18 --> RUS negative other than bilateral bosniak I renal cyst  Cystoscopy 2/18 negative for recurrence, cytology negative.  Cysto 2 months ago however showed fine papillary change on the right lateral wall concerning for either early recurrence versus edema.  He returned to the operating room on 05/18/2017 and underwent biopsy of this lesion.  Additionally, he underwent bilateral retrogrades.  It was a filling defect, likely an air bubble in the left collecting system and had a negative diagnostic ureteroscopy.  Surgical pathology consistent with high-grade TA disease.  Smoking history of 3ppd x >66yrs. Quit 13 years ago.  He has not urinary complaints Hardin this time.  He has not had a cough.  He has not had dysuria, gross hematuria or suprapubic pain.  He has not had fevers, chills, nausea or vomiting.  His UA today is positive for 6-10 WBC's, 11-30 RBC's and nitrite  negative.  This is his base line UA.     PMH: Past Medical History:  Diagnosis Date  . ATRIAL FIBRILLATION 03/05/2010   Qualifier: Diagnosis of  By: Lovena Le, MD, Martyn Malay   . Benign hypertension 12/16/2013  . Chronic obstructive pulmonary disease ([place]) 12/16/2013  . COPD (chronic obstructive pulmonary disease) ([place])   . Family history of adverse reaction to anesthesia    Brother has a difficult time waking up.  Marland Kitchen Hx pulmonary embolism 08/2000  . Hypercholesterolemia   . Hyperglycemia   . Hypertension   . Osteoarthritis   . Pre-diabetes   . PREMATURE VENTRICULAR CONTRACTIONS 03/05/2010   Qualifier: Diagnosis of  By: Lovena Le, MD, Martyn Malay   . Pulmonary embolism ([place]) 12/16/2013  . Stroke [facility]) 1997    Surgical History: Past Surgical History:  Procedure Laterality Date  . CYSTOSCOPY W/ RETROGRADES Bilateral 05/18/2017   Procedure: CYSTOSCOPY WITH RETROGRADE PYELOGRAM;  Surgeon: Hollice Espy, MD;  Location: [facility] ORS;  Service: Urology;  Laterality: Bilateral;  . CYSTOSCOPY WITH BIOPSY N/A 05/18/2017   Procedure: CYSTOSCOPY WITH BIOPSY;  Surgeon: Hollice Espy, MD;  Location: [facility] ORS;  Service: Urology;  Laterality: N/A;  . JOINT REPLACEMENT Right   . PERIPHERAL VASCULAR CATHETERIZATION N/A 02/04/2015   Procedure: IVC Filter Removal;  Surgeon: Katha Cabal, MD;  Location: [place] CV LAB;  Service: Cardiovascular;  Laterality: N/A;  . TONSILLECTOMY    . URETEROSCOPY Left 05/18/2017   Procedure: URETEROSCOPY;  Surgeon: Hollice Espy, MD;  Location: [facility] ORS;  Service: Urology;  Laterality: Left;    Home Medications:  Allergies as of 06/22/2017   No Known Allergies     Medication List        Accurate as of 06/22/17  1:52 PM. Always use your most recent med list.          ADVAIR DISKUS 250-50 MCG/DOSE Aepb Generic drug:  Fluticasone-Salmeterol Inhale 1 puff into the lungs 2 (two) times daily.   allopurinol 300 MG tablet Commonly known as:   ZYLOPRIM Take 300 mg by mouth daily. In am   aspirin 81 MG tablet Take 81 mg by mouth daily.   COLCRYS 0.6 MG tablet Generic drug:  colchicine Take 0.6 mg by mouth every other day.   diltiazem 240 MG 24 hr capsule Commonly known as:  CARDIZEM CD Take 240 mg by mouth daily. In am   docusate sodium 100 MG capsule Commonly known as:  COLACE Take 1 capsule (100 mg total) 2 (two) times daily as needed by mouth (take to keep stool soft.).   furosemide 20 MG tablet Commonly known as:  LASIX Take 20 mg by mouth daily. In am   HYDROcodone-acetaminophen 5-325 MG tablet Commonly known as:  NORCO/VICODIN Take 1-2 tablets every 6 (six) hours as needed by mouth for moderate pain.   MULTI-DAY Tabs Take 1 tablet by mouth daily. In am   potassium chloride 10 MEQ CR tablet Commonly known as:  KLOR-CON Take 10 mEq by mouth daily. In am   simvastatin 40 MG tablet Commonly known as:  ZOCOR Take 40 mg by mouth every morning.   SPIRIVA HANDIHALER 18 MCG inhalation capsule Generic drug:  tiotropium Place 18 mcg into inhaler and inhale daily. In am   warfarin 5 MG tablet Commonly known as:  COUMADIN Take 5 mg by mouth daily Hardin 6 PM.       Allergies: No Known Allergies  Family History: Family History  Problem Relation Age of Onset  . Prostate cancer Neg Hx   . Kidney cancer Neg Hx     Social History:  reports that he quit smoking about 26 years ago. he has never used smokeless tobacco. He reports that he does not drink alcohol or use drugs.  ROS: UROLOGY Frequent Urination?: No Hard to postpone urination?: No Burning/pain with urination?: No Get up Hardin night to urinate?: No Leakage of urine?: No Urine stream starts and stops?: No Trouble starting stream?: No Do you have to strain to urinate?: No Blood in urine?: No Urinary tract infection?: No Sexually transmitted disease?: No Injury to kidneys or bladder?: No Painful intercourse?: No Weak stream?: No Erection  problems?: No Penile pain?: No Gastrointestinal Nausea?: No Vomiting?: No Indigestion/heartburn?: No Diarrhea?: No Constipation?: No Constitutional Fever: No Night sweats?: No Weight loss?: No Fatigue?: No Skin Skin rash/lesions?: No Itching?: No Eyes Blurred vision?: No Double vision?: No Ears/Nose/Throat Sore throat?: No Sinus problems?: No Hematologic/Lymphatic Swollen glands?: No Easy bruising?: No Cardiovascular Leg swelling?: No Chest pain?: No Respiratory Cough?: No Shortness of breath?: No Endocrine Excessive thirst?: No Musculoskeletal Back pain?: No Joint pain?: No Neurological Headaches?: No Dizziness?: No Psychologic Depression?: No Anxiety?: No     Physical Exam: BP (!) 94/52 (BP Location: Right Arm, Patient Position: Sitting, Cuff Size: Large)   Pulse 79   Ht 5\' 11"  (1.803 m)   Wt 297 lb 3.2 oz (134.8 kg)   BMI 41.45 kg/m   Constitutional:  Alert and oriented, No acute distress.  Accompanied by wife today. HEENT: [name], moist mucus membranes.  Trachea midline, no masses. Cardiovascular: No clubbing, cyanosis, or edema. Respiratory: Normal respiratory effort, no increased work of breathing. GI: Abdomen is soft, nontender, nondistended, no abdominal masses GU: No CVA tenderness.  Neurologic: Grossly intact, no focal deficits, moving all 4 extremities. Psychiatric: Normal mood and affect.  Laboratory Data: Urinalysis 6-10 WBC's.   11-30 RBC's.   See Epic.  I have reviewed the labs.   Assessment & Plan:    1. Malignant neoplasm of lateral wall of urinary bladder (HCC)  - Recurrent small-volume low-grade TA disease  - Reviewed BCG treatment course, possible side effects including BCG sepsis, bladder irritation, worsening of her urinary symptoms  - # 1 of 6 BCG installed today   - RTC in one week for # 2 BCG  - Surveillance protocol also discussed today including cystoscopy every 3 months for Hardin least 2 years and then spread out  thereafter  - Urinalysis, Complete  - bcg vaccine injection 81 mg; Instill 3.24 mLs (81 mg total) into the bladder once.  - lidocaine (XYLOCAINE) 2 % jelly 1 application; Place 1 application into the urethra once.  - Patient was instructed to pour bleach down his/her toilet for the next 6 hours  -  Instructed to call the office if she should experience fevers greater than 102, chills/rigors, onset of a new cough, night sweats or further bladder spasms or inability to urinate    Return in about 1 week (around 06/29/2017) for # 2 BCG.  Zara Council, PA-C  [facility] [address] [place], [place] [address] [phone]

## 2017-06-22 ENCOUNTER — Encounter: Payer: Self-pay | Admitting: Urology

## 2017-06-22 ENCOUNTER — Ambulatory Visit (INDEPENDENT_AMBULATORY_CARE_PROVIDER_SITE_OTHER): Payer: Medicare Other | Admitting: Urology

## 2017-06-22 VITALS — BP 94/52 | HR 79 | Ht 71.0 in | Wt 297.2 lb

## 2017-06-22 DIAGNOSIS — C679 Malignant neoplasm of bladder, unspecified: Secondary | ICD-10-CM | POA: Diagnosis not present

## 2017-06-22 LAB — MICROSCOPIC EXAMINATION

## 2017-06-22 LAB — URINALYSIS, COMPLETE
Bilirubin, UA: NEGATIVE
GLUCOSE, UA: NEGATIVE
KETONES UA: NEGATIVE
NITRITE UA: NEGATIVE
Specific Gravity, UA: 1.02 (ref 1.005–1.030)
Urobilinogen, Ur: 1 mg/dL (ref 0.2–1.0)
pH, UA: 6 (ref 5.0–7.5)

## 2017-06-22 MED ORDER — BCG LIVE 50 MG IS SUSR
3.2400 mL | Freq: Once | INTRAVESICAL | Status: AC
  Administered 2017-06-22: 81 mg via INTRAVESICAL

## 2017-06-22 NOTE — Progress Notes (Signed)
BCG Bladder Instillation  BCG # 1  Due to Bladder Cancer patient is present today for a BCG treatment. Patient was cleaned and prepped in a sterile fashion with betadine and lidocaine 2% jelly was instilled into the urethra.  A 14FR catheter was inserted, urine return was noted 38ml, urine was yellow in color.  30ml of reconstituted BCG was instilled into the bladder. The catheter was then removed. Patient tolerated well, no complications were noted  Preformed by: Zara Council, PA-C, Elberta Leatherwood, CMA  Follow up/ Additional notes: 1 week #2

## 2017-06-24 NOTE — Progress Notes (Signed)
06/27/2017 2:37 PM   Primus Gritton Gowdy 27-Sep-1931 [id number]  Referring provider: [name], MD [facility] [place], [place] [address]  Chief Complaint  Patient presents with  . BCG treatment    HPI: 83 yo WM with history of bladder cancer who presents today to begin an induction course of BCG.  This will be 2 of 6.    CT urogram on 07/02/2014 showed a 16 mm bladder stone as well as a 1 cm area on the anterior wall of the bladder suspicious for bladder carcinoma. He was taken to the operating room on 1/227/2016 for cystolitholapaxy and TURBT.At this time, the stone was completely fragmented and removed and as small 1 cm well-circumscribed frondular tumor was noted and the LEFT anterior wall of the bladder. 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   He completed BCG x 6 (induction course) on 10/2014.  Most recent upper tract imaging on 2/18 --> RUS negative other than bilateral bosniak I renal cyst  Cystoscopy 2/18 negative for recurrence, cytology negative.  Cysto 2 months ago however showed fine papillary change on the right lateral wall concerning for either early recurrence versus edema.  He returned to the operating room on 05/18/2017 and underwent biopsy of this lesion.  Additionally, he underwent bilateral retrogrades.  It was a filling defect, likely an air bubble in the left collecting system and had a negative diagnostic ureteroscopy.  Surgical pathology consistent with high-grade TA disease.  Smoking history of 3ppd x >8yrs. Quit 13 years ago.  He has not urinary complaints at this time.  He has not had a cough.  He has not had dysuria, gross hematuria or suprapubic pain.  He has not had fevers, chills, nausea or vomiting.  His UA today is positive for 11-30WBC'a.  This is his base line UA.      PMH: Past Medical History:  Diagnosis Date  . ATRIAL FIBRILLATION 03/05/2010   Qualifier: Diagnosis of  By: Lovena Le, MD, Martyn Malay   . Benign hypertension 12/16/2013  . Chronic obstructive pulmonary disease ([place]) 12/16/2013  . COPD (chronic obstructive pulmonary disease) ([place])   . Family history of adverse reaction to anesthesia    Brother has a difficult time waking up.  Marland Kitchen Hx pulmonary embolism 08/2000  . Hypercholesterolemia   . Hyperglycemia   . Hypertension   . Osteoarthritis   . Pre-diabetes   . PREMATURE VENTRICULAR CONTRACTIONS 03/05/2010   Qualifier: Diagnosis of  By: Lovena Le, MD, Martyn Malay   . Pulmonary embolism ([place]) 12/16/2013  . Stroke [facility]) 1997    Surgical History: Past Surgical History:  Procedure Laterality Date  . CYSTOSCOPY W/ RETROGRADES Bilateral 05/18/2017   Procedure: CYSTOSCOPY WITH RETROGRADE PYELOGRAM;  Surgeon: Hollice Espy, MD;  Location: [facility] ORS;  Service: Urology;  Laterality: Bilateral;  . CYSTOSCOPY WITH BIOPSY N/A 05/18/2017   Procedure: CYSTOSCOPY WITH BIOPSY;  Surgeon: Hollice Espy, MD;  Location: [facility] ORS;  Service: Urology;  Laterality: N/A;  . JOINT REPLACEMENT Right   . PERIPHERAL VASCULAR CATHETERIZATION N/A 02/04/2015   Procedure: IVC Filter Removal;  Surgeon: Katha Cabal, MD;  Location: [facility];  Service: Cardiovascular;  Laterality: N/A;  . TONSILLECTOMY    . URETEROSCOPY Left 05/18/2017   Procedure: URETEROSCOPY;  Surgeon: Hollice Espy, MD;  Location: [facility] ORS;  Service: Urology;  Laterality: Left;    Home Medications:  Allergies as of 06/27/2017   No Known Allergies     Medication List        Accurate as of 06/27/17  2:37 PM. Always use your most recent med list.          ADVAIR DISKUS 250-50 MCG/DOSE Aepb Generic drug:  Fluticasone-Salmeterol Inhale 1 puff into the lungs 2 (two) times daily.   allopurinol 300 MG tablet Commonly known as:  ZYLOPRIM Take 300 mg by mouth daily. In  am   aspirin 81 MG tablet Take 81 mg by mouth daily.   COLCRYS 0.6 MG tablet Generic drug:  colchicine Take 0.6 mg by mouth every other day.   diltiazem 240 MG 24 hr capsule Commonly known as:  CARDIZEM CD Take 240 mg by mouth daily. In am   docusate sodium 100 MG capsule Commonly known as:  COLACE Take 1 capsule (100 mg total) 2 (two) times daily as needed by mouth (take to keep stool soft.).   furosemide 20 MG tablet Commonly known as:  LASIX Take 20 mg by mouth daily. In am   HYDROcodone-acetaminophen 5-325 MG tablet Commonly known as:  NORCO/VICODIN Take 1-2 tablets every 6 (six) hours as needed by mouth for moderate pain.   MULTI-DAY Tabs Take 1 tablet by mouth daily. In am   potassium chloride 10 MEQ CR tablet Commonly known as:  KLOR-CON Take 10 mEq by mouth daily. In am   simvastatin 40 MG tablet Commonly known as:  ZOCOR Take 40 mg by mouth every morning.   SPIRIVA HANDIHALER 18 MCG inhalation capsule Generic drug:  tiotropium Place 18 mcg into inhaler and inhale daily. In am   warfarin 5 MG tablet Commonly known as:  COUMADIN Take 5 mg by mouth daily at 6 PM.       Allergies: No Known Allergies  Family History: Family History  Problem Relation Age of Onset  . Prostate cancer Neg Hx   . Kidney cancer Neg Hx     Social History:  reports that he quit smoking about 26 years ago. he has never used smokeless tobacco. He reports that he does not drink alcohol or use drugs.  ROS: UROLOGY Frequent Urination?: No Hard to postpone urination?: No Burning/pain with urination?: No Get up at night to urinate?: No Leakage of urine?: No Urine stream starts and stops?: No Trouble starting stream?: No Do you have to strain to urinate?: No Blood in urine?: No Urinary tract infection?: No Sexually transmitted disease?: No Injury to kidneys or bladder?: No Painful intercourse?: No Weak stream?: No Erection problems?: No Penile pain?:  No Gastrointestinal Nausea?: No Vomiting?: No Indigestion/heartburn?: No Diarrhea?: No Constipation?: No Constitutional Fever: No Night sweats?: No Weight loss?: No Fatigue?: No Skin Skin rash/lesions?: No Itching?: No Eyes Blurred vision?: No Double vision?: No Ears/Nose/Throat Sore throat?: No Sinus problems?: No Hematologic/Lymphatic Swollen glands?: No Easy bruising?: No Cardiovascular Leg swelling?: No Chest pain?: No Respiratory Cough?: No Shortness of breath?: No Endocrine Excessive thirst?: No Musculoskeletal Back pain?: No Joint pain?: No Neurological Headaches?: No Dizziness?: No Psychologic Depression?: No Anxiety?: No     Physical Exam: BP 113/60   Pulse 77   Ht 5\' 11"  (1.803 m)   Wt 297 lb 14.4 oz (135.1 kg)   BMI 41.55 kg/m   Constitutional:  Alert and oriented, No acute distress.  Accompanied by wife today. HEENT: [place] AT, moist mucus membranes.  Trachea midline, no masses. Cardiovascular: No clubbing,  cyanosis, or edema. Respiratory: Normal respiratory effort, no increased work of breathing. GI: Abdomen is soft, nontender, nondistended, no abdominal masses GU: No CVA tenderness.  Neurologic: Grossly intact, no focal deficits, moving all 4 extremities. Psychiatric: Normal mood and affect.  Laboratory Data: Urinalysis 11-30WBC's.    See Epic.  I have reviewed the labs.   Assessment & Plan:    1. Malignant neoplasm of lateral wall of urinary bladder (HCC)  - Recurrent small-volume low-grade TA disease  - Reviewed BCG treatment course, possible side effects including BCG sepsis, bladder irritation, worsening of her urinary symptoms  - # 2 of 6 BCG installed today   - RTC in one week for # 3 BCG  - Surveillance protocol also discussed today including cystoscopy every 3 months for at least 2 years and then spread out thereafter  - Urinalysis, Complete  - bcg vaccine injection 81 mg; Instill 3.24 mLs (81 mg total) into the bladder  once.  - lidocaine (XYLOCAINE) 2 % jelly 1 application; Place 1 application into the urethra once.  - Patient was instructed to pour bleach down his/her toilet for the next 6 hours  -  Instructed to call the office if she should experience fevers greater than 102, chills/rigors, onset of a new cough, night sweats or further bladder spasms or inability to urinate    Return in about 1 week (around 07/04/2017) for # 3 BCG.  Zara Council, PA-C  [facility] [address], [place],  [address] [phone]

## 2017-06-27 ENCOUNTER — Other Ambulatory Visit: Payer: Self-pay

## 2017-06-27 ENCOUNTER — Encounter: Payer: Self-pay | Admitting: Urology

## 2017-06-27 ENCOUNTER — Ambulatory Visit (INDEPENDENT_AMBULATORY_CARE_PROVIDER_SITE_OTHER): Payer: Medicare Other | Admitting: Urology

## 2017-06-27 VITALS — BP 113/60 | HR 77 | Ht 71.0 in | Wt 297.9 lb

## 2017-06-27 DIAGNOSIS — C679 Malignant neoplasm of bladder, unspecified: Secondary | ICD-10-CM

## 2017-06-27 LAB — URINALYSIS, COMPLETE
BILIRUBIN UA: NEGATIVE
Glucose, UA: NEGATIVE
Ketones, UA: NEGATIVE
Nitrite, UA: NEGATIVE
PH UA: 6 (ref 5.0–7.5)
SPEC GRAV UA: 1.025 (ref 1.005–1.030)
Urobilinogen, Ur: 1 mg/dL (ref 0.2–1.0)

## 2017-06-27 LAB — MICROSCOPIC EXAMINATION

## 2017-06-27 MED ORDER — BCG LIVE 50 MG IS SUSR
3.2400 mL | Freq: Once | INTRAVESICAL | Status: AC
  Administered 2017-06-27: 81 mg via INTRAVESICAL

## 2017-06-27 NOTE — Progress Notes (Signed)
BCG Bladder Instillation  BCG # 2  Due to Bladder Cancer patient is present today for a BCG treatment. Patient was cleaned and prepped in a sterile fashion with betadine and lidocaine 2% jelly was instilled into the urethra.  A 14FR catheter was inserted, urine return was noted 285ml, urine was yellow in color.  9ml of reconstituted BCG was instilled into the bladder. The catheter was then removed. Patient tolerated well, no complications were noted  Preformed by: Zara Council, PA-C and C. Corinna Capra, CMA  Follow up: 1 week for #3

## 2017-06-27 NOTE — Addendum Note (Signed)
Addended by: Leia Alf on: 06/27/2017 04:21 PM   Modules accepted: Orders

## 2017-07-06 ENCOUNTER — Ambulatory Visit (INDEPENDENT_AMBULATORY_CARE_PROVIDER_SITE_OTHER): Payer: Medicare Other | Admitting: Urology

## 2017-07-06 VITALS — BP 112/66 | HR 92 | Ht 72.0 in | Wt 297.0 lb

## 2017-07-06 DIAGNOSIS — C679 Malignant neoplasm of bladder, unspecified: Secondary | ICD-10-CM | POA: Diagnosis not present

## 2017-07-06 LAB — URINALYSIS, COMPLETE
Bilirubin, UA: NEGATIVE
GLUCOSE, UA: NEGATIVE
Ketones, UA: NEGATIVE
LEUKOCYTES UA: NEGATIVE
Nitrite, UA: NEGATIVE
Specific Gravity, UA: 1.025 (ref 1.005–1.030)
UUROB: 1 mg/dL (ref 0.2–1.0)
pH, UA: 5.5 (ref 5.0–7.5)

## 2017-07-06 LAB — MICROSCOPIC EXAMINATION

## 2017-07-06 MED ORDER — BCG LIVE 50 MG IS SUSR
3.2400 mL | Freq: Once | INTRAVESICAL | Status: AC
  Administered 2017-07-06: 81 mg via INTRAVESICAL

## 2017-07-06 NOTE — Progress Notes (Signed)
BCG Bladder Instillation  BCG # 3  Due to Bladder Cancer patient is present today for a BCG treatment. Patient was cleaned and prepped in a sterile fashion with betadine and lidocaine 2% jelly was instilled into the urethra.  A 14FR catheter was inserted, urine return was noted 166ml, urine was yellow in color.  29ml of reconstituted BCG was instilled into the bladder. The catheter was then removed. Patient tolerated well, no complications were noted  Preformed by: Toniann Fail, LPN   Blood pressure 112/66, pulse 92, height 6' (1.829 m), weight 297 lb (134.7 kg).  I was available. S.C. Stoioff, M.D.

## 2017-07-13 ENCOUNTER — Encounter: Payer: Self-pay | Admitting: Urology

## 2017-07-13 ENCOUNTER — Ambulatory Visit (INDEPENDENT_AMBULATORY_CARE_PROVIDER_SITE_OTHER): Payer: Medicare Other | Admitting: Urology

## 2017-07-13 VITALS — BP 118/60 | HR 125 | Ht 72.0 in | Wt 301.2 lb

## 2017-07-13 DIAGNOSIS — C679 Malignant neoplasm of bladder, unspecified: Secondary | ICD-10-CM | POA: Diagnosis not present

## 2017-07-13 LAB — URINALYSIS, COMPLETE
BILIRUBIN UA: NEGATIVE
GLUCOSE, UA: NEGATIVE
KETONES UA: NEGATIVE
LEUKOCYTES UA: NEGATIVE
Nitrite, UA: NEGATIVE
RBC, UA: NEGATIVE
SPEC GRAV UA: 1.025 (ref 1.005–1.030)
Urobilinogen, Ur: 0.2 mg/dL (ref 0.2–1.0)
pH, UA: 5.5 (ref 5.0–7.5)

## 2017-07-13 LAB — MICROSCOPIC EXAMINATION: RBC, UA: NONE SEEN /hpf (ref 0–?)

## 2017-07-13 MED ORDER — BCG LIVE 50 MG IS SUSR
3.2400 mL | Freq: Once | INTRAVESICAL | Status: AC
  Administered 2017-07-13: 81 mg via INTRAVESICAL

## 2017-07-13 NOTE — Progress Notes (Addendum)
07/13/2017 2:16 PM   [name] Hardin 1932-04-15 [id number]  Referring provider: [name], MD [facility] [place], [place] [address]  Chief Complaint  Patient presents with  . Bladder Cancer    HPI: 84 yo WM with history of bladder cancer who presents today to begin an induction course of BCG.  This will be 4 of 6.    CT urogram on 07/02/2014 showed a 16 mm bladder stone as well as a 1 cm area on the anterior wall of the bladder suspicious for bladder carcinoma. He was taken to the operating room on 1/227/2016 for cystolitholapaxy and TURBT.At this time, the stone was completely fragmented and removed and as small 1 cm well-circumscribed frondular tumor was noted and the LEFT anterior wall of the bladder. 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   He completed BCG x 6 (induction course) on 10/2014.  Most recent upper tract imaging on 2/18 --> RUS negative other than bilateral bosniak I renal cyst  Cystoscopy 2/18 negative for recurrence, cytology negative.  Cysto 2 months ago however showed fine papillary change on the right lateral wall concerning for either early recurrence versus edema.  He returned to the operating room on 05/18/2017 and underwent biopsy of this lesion.  Additionally, he underwent bilateral retrogrades.  It was a filling defect, likely an air bubble in the left collecting system and had a negative diagnostic ureteroscopy.  Surgical pathology consistent with high-grade TA disease.  Smoking history of 3ppd x >61yrs. Quit 13 years ago.  He has not urinary complaints at this time.  He has not had a cough.  He has not had dysuria, gross hematuria or suprapubic pain.  He has not had fevers, chills, nausea or vomiting.  His UA today is negative.  He is able to hold his instillation for  2 hours.       PMH: Past Medical History:  Diagnosis Date  . ATRIAL FIBRILLATION 03/05/2010   Qualifier: Diagnosis of  By: Lovena Le, MD, Martyn Malay   . Benign hypertension 12/16/2013  . Chronic obstructive pulmonary disease ([place]) 12/16/2013  . COPD (chronic obstructive pulmonary disease) ([place])   . Family history of adverse reaction to anesthesia    Brother has a difficult time waking up.  Marland Kitchen Hx pulmonary embolism 08/2000  . Hypercholesterolemia   . Hyperglycemia   . Hypertension   . Osteoarthritis   . Pre-diabetes   . PREMATURE VENTRICULAR CONTRACTIONS 03/05/2010   Qualifier: Diagnosis of  By: Lovena Le, MD, Martyn Malay   . Pulmonary embolism ([place]) 12/16/2013  . Stroke [facility]) 1997    Surgical History: Past Surgical History:  Procedure Laterality Date  . CYSTOSCOPY W/ RETROGRADES Bilateral 05/18/2017   Procedure: CYSTOSCOPY WITH RETROGRADE PYELOGRAM;  Surgeon: Hollice Espy, MD;  Location: [facility] ORS;  Service: Urology;  Laterality: Bilateral;  . CYSTOSCOPY WITH BIOPSY N/A 05/18/2017   Procedure: CYSTOSCOPY WITH BIOPSY;  Surgeon: Hollice Espy, MD;  Location: [facility] ORS;  Service: Urology;  Laterality: N/A;  . JOINT REPLACEMENT Right   . PERIPHERAL VASCULAR CATHETERIZATION N/A 02/04/2015   Procedure: IVC Filter Removal;  Surgeon: Katha Cabal, MD;  Location: [facility];  Service: Cardiovascular;  Laterality: N/A;  . TONSILLECTOMY    . URETEROSCOPY Left 05/18/2017   Procedure: URETEROSCOPY;  Surgeon: Hollice Espy, MD;  Location: [facility]  ORS;  Service: Urology;  Laterality: Left;    Home Medications:  Allergies as of 07/13/2017   No Known Allergies     Medication List        Accurate as of 07/13/17  2:16 PM. Always use your most recent med list.          ADVAIR DISKUS 250-50 MCG/DOSE Aepb Generic drug:  Fluticasone-Salmeterol Inhale 1 puff into the lungs 2 (two) times daily.   allopurinol 300 MG tablet Commonly known as:  ZYLOPRIM Take 300 mg by mouth  daily. In am   aspirin 81 MG tablet Take 81 mg by mouth daily.   COLCRYS 0.6 MG tablet Generic drug:  colchicine Take 0.6 mg by mouth every other day.   diltiazem 240 MG 24 hr capsule Commonly known as:  CARDIZEM CD Take 240 mg by mouth daily. In am   furosemide 20 MG tablet Commonly known as:  LASIX Take 20 mg by mouth daily. In am   MULTI-DAY Tabs Take 1 tablet by mouth daily. In am   potassium chloride 10 MEQ CR tablet Commonly known as:  KLOR-CON Take 10 mEq by mouth daily. In am   simvastatin 40 MG tablet Commonly known as:  ZOCOR Take 40 mg by mouth every morning.   SPIRIVA HANDIHALER 18 MCG inhalation capsule Generic drug:  tiotropium Place 18 mcg into inhaler and inhale daily. In am   warfarin 5 MG tablet Commonly known as:  COUMADIN Take 5 mg by mouth daily at 6 PM.       Allergies: No Known Allergies  Family History: Family History  Problem Relation Age of Onset  . Prostate cancer Neg Hx   . Kidney cancer Neg Hx     Social History:  reports that he quit smoking about 26 years ago. he has never used smokeless tobacco. He reports that he does not drink alcohol or use drugs.  ROS: UROLOGY Frequent Urination?: No Hard to postpone urination?: No Burning/pain with urination?: No Get up at night to urinate?: No Leakage of urine?: No Urine stream starts and stops?: No Trouble starting stream?: No Do you have to strain to urinate?: No Blood in urine?: No Urinary tract infection?: No Sexually transmitted disease?: No Injury to kidneys or bladder?: No Painful intercourse?: No Weak stream?: No Erection problems?: No Penile pain?: No Gastrointestinal Nausea?: No Vomiting?: No Indigestion/heartburn?: No Diarrhea?: No Constipation?: No Constitutional Fever: No Night sweats?: No Weight loss?: No Fatigue?: No Skin Skin rash/lesions?: No Itching?: No Eyes Blurred vision?: No Double vision?: No Ears/Nose/Throat Sore throat?: No Sinus  problems?: No Hematologic/Lymphatic Swollen glands?: No Easy bruising?: No Cardiovascular Leg swelling?: No Chest pain?: No Respiratory Cough?: No Shortness of breath?: No Endocrine Excessive thirst?: No Musculoskeletal Back pain?: No Joint pain?: No Neurological Headaches?: No Dizziness?: No Psychologic Depression?: No Anxiety?: No     Physical Exam: BP 118/60 (BP Location: Right Arm, Patient Position: Sitting, Cuff Size: Large)   Pulse (!) 125   Ht 6' (1.829 m)   Wt (!) 301 lb 3.2 oz (136.6 kg)   BMI 40.85 kg/m   Constitutional:  Alert and oriented, No acute distress.  Accompanied by wife today. HEENT: [place] AT, moist mucus membranes.  Trachea midline, no masses. Cardiovascular: No clubbing, cyanosis, or edema. Respiratory: Normal respiratory effort, no increased work of breathing. GI: Abdomen is soft, nontender, nondistended, no abdominal masses GU: No CVA tenderness.  Neurologic: Grossly intact, no focal deficits, moving all 4 extremities. Psychiatric: Normal mood and  affect.  Laboratory Data: Urinalysis Negative.  See Epic.  I have reviewed the labs.   Assessment & Plan:    1. Malignant neoplasm of lateral wall of urinary bladder (HCC)  - Recurrent small-volume low-grade TA disease  - Reviewed BCG treatment course, possible side effects including BCG sepsis, bladder irritation, worsening of her urinary symptoms  - # 4 of 6 BCG installed today   - RTC in one week for # 5 BCG  - Surveillance protocol also discussed today including cystoscopy every 3 months for at least 2 years and then spread out thereafter  - Urinalysis, Complete  - bcg vaccine injection 81 mg; Instill 3.24 mLs (81 mg total) into the bladder once.  - lidocaine (XYLOCAINE) 2 % jelly 1 application; Place 1 application into the urethra once.  - Patient was instructed to pour bleach down his/her toilet for the next 6 hours  -  Instructed to call the office if she should experience fevers  greater than 102, chills/rigors, onset of a new cough, night sweats or further bladder spasms or inability to urinate    Return in about 1 week (around 07/20/2017) for # 5 BCG.  Zara Council, PA-C  [facility] [address], [place], [place] [address] [phone]

## 2017-07-17 NOTE — Progress Notes (Signed)
   07/18/2017 2:21 PM   [name] Hardin [birth year] [id number]  Referring provider: [name], MD [place] [place], [place] [address]  Chief Complaint  Patient presents with  . Bladder Cancer    HPI: 84 yo WM with history of bladder cancer who presents today to begin an induction course of BCG.  This will be 5 of 6.    CT urogram on 07/02/2014 showed a 16 mm bladder stone as well as a 1 cm area on the anterior wall of the bladder suspicious for bladder carcinoma. He was taken to the operating room on 1/227/2016 for cystolitholapaxy and TURBT.At this time, the stone was completely fragmented and removed and as small 1 cm well-circumscribed frondular tumor was noted and the LEFT anterior wall of the bladder. 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   He completed BCG x 6 (induction course) on 10/2014.  Most recent upper tract imaging on 2/18 --> RUS negative other than bilateral bosniak I renal cyst  Cystoscopy 2/18 negative for recurrence, cytology negative.  Cysto 2 months ago however showed fine papillary change on the right lateral wall concerning for either early recurrence versus edema.  He returned to the operating room on 05/18/2017 and underwent biopsy of this lesion.  Additionally, he underwent bilateral retrogrades.  It was a filling defect, likely an air bubble in the left collecting system and had a negative diagnostic ureteroscopy.  Surgical pathology consistent with high-grade TA disease.  Smoking history of 3ppd x >52yrs. Quit 13 years ago.  He has not urinary complaints at this time.  He has not had a cough.  He has not had dysuria, gross hematuria or suprapubic pain.  He has not had fevers, chills, nausea or vomiting.  His UA today is positive for 6-10 WBC's.  He is able to hold his  instillation for 2 hours.        Laboratory Data: Urinalysis 6-10 WBC's.  See Epic. I have reviewed the labs.   Assessment & Plan:    1. Malignant neoplasm of lateral wall of urinary bladder (HCC)  - Recurrent small-volume low-grade TA disease  - Reviewed BCG treatment course, possible side effects including BCG sepsis, bladder irritation, worsening of her urinary symptoms  - # 5 of 6 BCG installed today   - RTC in one week for # 6 BCG  - Surveillance protocol also discussed today including cystoscopy every 3 months for at least 2 years and then spread out thereafter  - Urinalysis, Complete  - bcg vaccine injection 81 mg; Instill 3.24 mLs (81 mg total) into the bladder once.  - lidocaine (XYLOCAINE) 2 % jelly 1 application; Place 1 application into the urethra once.  - Patient was instructed to pour bleach down his/her toilet for the next 6 hours  - Instructed to call the office if she should experience fevers greater than 102, chills/rigors, onset of a new cough, night sweats or further bladder spasms or inability to urinate    Return for # 6 BCG.  Zara Council, PA-C  [facility] [address] [place], [place] [address] [phone]

## 2017-07-18 ENCOUNTER — Encounter: Payer: Self-pay | Admitting: Urology

## 2017-07-18 ENCOUNTER — Ambulatory Visit (INDEPENDENT_AMBULATORY_CARE_PROVIDER_SITE_OTHER): Payer: Medicare Other | Admitting: Urology

## 2017-07-18 DIAGNOSIS — C679 Malignant neoplasm of bladder, unspecified: Secondary | ICD-10-CM | POA: Diagnosis not present

## 2017-07-18 LAB — MICROSCOPIC EXAMINATION
Epithelial Cells (non renal): NONE SEEN /hpf (ref 0–10)
RBC, UA: NONE SEEN /hpf (ref 0–?)

## 2017-07-18 LAB — URINALYSIS, COMPLETE
BILIRUBIN UA: NEGATIVE
GLUCOSE, UA: NEGATIVE
Ketones, UA: NEGATIVE
Nitrite, UA: NEGATIVE
PH UA: 5.5 (ref 5.0–7.5)
RBC UA: NEGATIVE
SPEC GRAV UA: 1.025 (ref 1.005–1.030)
UUROB: 1 mg/dL (ref 0.2–1.0)

## 2017-07-18 MED ORDER — BCG LIVE 50 MG IS SUSR
3.2400 mL | Freq: Once | INTRAVESICAL | Status: AC
  Administered 2017-07-18: 81 mg via INTRAVESICAL

## 2017-07-18 NOTE — Progress Notes (Signed)
BCG Bladder Instillation  BCG # 5  Due to Bladder Cancer patient is present today for a BCG treatment. Patient was cleaned and prepped in a sterile fashion with betadine and lidocaine 2% jelly was instilled into the urethra.  A 14FR catheter was inserted, urine return was noted 102ml, urine was yellow in color.  45ml of reconstituted BCG was instilled into the bladder. The catheter was then removed. Patient tolerated well, no complications were noted  Preformed by: Zara Council, PA-C, Elberta Leatherwood, CMA  Follow up/ Additional notes: 1 week #6

## 2017-07-24 NOTE — Progress Notes (Signed)
   07/25/2017 10:46 PM   [name] Hardin March 24, 1932 [id number]  Referring provider: [name], MD [facility] [place], [place] [address]  Chief Complaint  Patient presents with  . Other    BCG #6    HPI: 84 yo WM with history of bladder cancer who presents today to begin an induction course of BCG.  This will be 6 of 6.    CT urogram on 07/02/2014 showed a 16 mm bladder stone as well as a 1 cm area on the anterior wall of the bladder suspicious for bladder carcinoma. He was taken to the operating room on 1/227/2016 for cystolitholapaxy and TURBT.At this time, the stone was completely fragmented and removed and as small 1 cm well-circumscribed frondular tumor was noted and the LEFT anterior wall of the bladder. 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   He completed BCG x 6 (induction course) on 10/2014.  Most recent upper tract imaging on 2/18 --> RUS negative other than bilateral bosniak I renal cyst  Cystoscopy 2/18 negative for recurrence, cytology negative.  Cysto 2 months ago however showed fine papillary change on the right lateral wall concerning for either early recurrence versus edema.  He returned to the operating room on 05/18/2017 and underwent biopsy of this lesion.  Additionally, he underwent bilateral retrogrades.  It was a filling defect, likely an air bubble in the left collecting system and had a negative diagnostic ureteroscopy.  Surgical pathology consistent with high-grade TA disease.  Smoking history of 3ppd x >20yrs. Quit 13 years ago.  He has not urinary complaints at this time.  He has not had a cough.  He has not had dysuria, gross hematuria or suprapubic pain.  He has not had fevers, chills, nausea or vomiting.  His UA today is positive for 6-10 WBC's.  He is able to hold his  instillation for 2 hours.        Laboratory Data: Urinalysis Negative.  See Epic. I have reviewed the labs.   Assessment & Plan:    1. Malignant neoplasm of lateral wall of urinary bladder (HCC)  - Recurrent small-volume low-grade TA disease  - Reviewed BCG treatment course, possible side effects including BCG sepsis, bladder irritation, worsening of her urinary symptoms  - # 6 of 6 BCG installed today   - RTC on 10/11/2017 for surveillance cystoscopy   - Surveillance protocol also discussed today including cystoscopy every 3 months for at least 2 years and then spread out thereafter  - Urinalysis, Complete  - bcg vaccine injection 81 mg; Instill 3.24 mLs (81 mg total) into the bladder once.  - lidocaine (XYLOCAINE) 2 % jelly 1 application; Place 1 application into the urethra once.  - Patient was instructed to pour bleach down his/her toilet for the next 6 hours  - Instructed to call the office if she should experience fevers greater than 102, chills/rigors, onset of a new cough, night sweats or further bladder spasms or inability to urinate    Return for 10/11/2017 for surveillance cystoscopy.  Zara Council, PA-C  [facility] [address] [place], [place] [address] [phone]

## 2017-07-25 ENCOUNTER — Ambulatory Visit: Payer: Medicare Other

## 2017-07-25 ENCOUNTER — Other Ambulatory Visit: Payer: Self-pay | Admitting: Physician Assistant

## 2017-07-25 ENCOUNTER — Ambulatory Visit (INDEPENDENT_AMBULATORY_CARE_PROVIDER_SITE_OTHER): Payer: Medicare Other | Admitting: Urology

## 2017-07-25 ENCOUNTER — Ambulatory Visit
Admission: RE | Admit: 2017-07-25 | Discharge: 2017-07-25 | Disposition: A | Discharge status: Home / Self Care | Payer: Medicare Other | Source: Ambulatory Visit | Attending: Physician Assistant | Admitting: Physician Assistant

## 2017-07-25 DIAGNOSIS — C679 Malignant neoplasm of bladder, unspecified: Secondary | ICD-10-CM | POA: Diagnosis not present

## 2017-07-25 DIAGNOSIS — J90 Pleural effusion, not elsewhere classified: Secondary | ICD-10-CM | POA: Insufficient documentation

## 2017-07-25 DIAGNOSIS — I313 Pericardial effusion (noninflammatory): Secondary | ICD-10-CM | POA: Insufficient documentation

## 2017-07-25 DIAGNOSIS — R0602 Shortness of breath: Secondary | ICD-10-CM

## 2017-07-25 DIAGNOSIS — I2510 Atherosclerotic heart disease of native coronary artery without angina pectoris: Secondary | ICD-10-CM | POA: Diagnosis not present

## 2017-07-25 DIAGNOSIS — I517 Cardiomegaly: Secondary | ICD-10-CM | POA: Insufficient documentation

## 2017-07-25 DIAGNOSIS — I700 Atherosclerosis of aorta: Secondary | ICD-10-CM | POA: Insufficient documentation

## 2017-07-25 DIAGNOSIS — K8020 Calculus of gallbladder without cholecystitis without obstruction: Secondary | ICD-10-CM | POA: Insufficient documentation

## 2017-07-25 DIAGNOSIS — I7789 Other specified disorders of arteries and arterioles: Secondary | ICD-10-CM | POA: Insufficient documentation

## 2017-07-25 HISTORY — DX: Malignant (primary) neoplasm, unspecified: C80.1

## 2017-07-25 LAB — MICROSCOPIC EXAMINATION

## 2017-07-25 LAB — URINALYSIS, COMPLETE
BILIRUBIN UA: NEGATIVE
Glucose, UA: NEGATIVE
Nitrite, UA: NEGATIVE
PH UA: 5.5 (ref 5.0–7.5)
Specific Gravity, UA: 1.025 (ref 1.005–1.030)
UUROB: 1 mg/dL (ref 0.2–1.0)

## 2017-07-25 LAB — POCT I-STAT CREATININE: CREATININE: 1.2 mg/dL (ref 0.61–1.24)

## 2017-07-25 MED ORDER — IOPAMIDOL (ISOVUE-370) INJECTION 76%
75.0000 mL | Freq: Once | INTRAVENOUS | Status: AC | PRN
  Administered 2017-07-25: 75 mL via INTRAVENOUS

## 2017-07-26 ENCOUNTER — Ambulatory Visit (INDEPENDENT_AMBULATORY_CARE_PROVIDER_SITE_OTHER): Payer: Medicare Other | Admitting: Urology

## 2017-07-26 ENCOUNTER — Encounter: Payer: Self-pay | Admitting: Urology

## 2017-07-26 VITALS — BP 117/66 | HR 83 | Ht 72.0 in | Wt 300.0 lb

## 2017-07-26 DIAGNOSIS — C679 Malignant neoplasm of bladder, unspecified: Secondary | ICD-10-CM

## 2017-07-26 MED ORDER — BCG LIVE 50 MG IS SUSR
3.2400 mL | Freq: Once | INTRAVESICAL | Status: AC
  Administered 2017-07-26: 81 mg via INTRAVESICAL

## 2017-07-26 NOTE — Progress Notes (Signed)
   07/26/2017 1:40 PM   [name]in 03-18-1932 [id number]  Referring provider: [name], MD [place] [place], [place] [address]  Chief Complaint  Patient presents with  . Bladder Cancer    HPI: 84 yo WM with history of bladder cancer who presents today to begin an induction course of BCG.  This will be 6 of 6.    CT urogram on 07/02/2014 showed a 16 mm bladder stone as well as a 1 cm area on the anterior wall of the bladder suspicious for bladder carcinoma. He was taken to the operating room on 1/227/2016 for cystolitholapaxy and TURBT.At this time, the stone was completely fragmented and removed and as small 1 cm well-circumscribed frondular tumor was noted and the LEFT anterior wall of the bladder. 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   He completed BCG x 6 (induction course) on 10/2014.  Most recent upper tract imaging on 2/18 --> RUS negative other than bilateral bosniak I renal cyst  Cystoscopy 2/18 negative for recurrence, cytology negative.  Cysto 2 months ago however showed fine papillary change on the right lateral wall concerning for either early recurrence versus edema.  He returned to the operating room on 05/18/2017 and underwent biopsy of this lesion.  Additionally, he underwent bilateral retrogrades.  It was a filling defect, likely an air bubble in the left collecting system and had a negative diagnostic ureteroscopy.  Surgical pathology consistent with high-grade TA disease.  Smoking history of 3ppd x >58yrs. Quit 13 years ago.  He has not urinary complaints at this time.  He has not had a cough.  He has not had dysuria, gross hematuria or suprapubic pain.  He has not had fevers, chills, nausea or vomiting.  His UA today is negative.  He is able to hold his instillation for  2 hours.        Laboratory Data: Urinalysis Negative.  See Epic. I have reviewed the labs.   Assessment & Plan:    1. Malignant neoplasm of lateral wall of urinary bladder (HCC)  - Recurrent small-volume low-grade TA disease  - Reviewed BCG treatment course, possible side effects including BCG sepsis, bladder irritation, worsening of her urinary symptoms  - # 6 of 6 BCG installed today   - RTC on 10/11/2017 for surveillance cystoscopy   - Surveillance protocol also discussed today including cystoscopy every 3 months for at least 2 years and then spread out thereafter  - Urinalysis, Complete  - bcg vaccine injection 81 mg; Instill 3.24 mLs (81 mg total) into the bladder once.  - lidocaine (XYLOCAINE) 2 % jelly 1 application; Place 1 application into the urethra once.  - Patient was instructed to pour bleach down his/her toilet for the next 6 hours  - Instructed to call the office if she should experience fevers greater than 102, chills/rigors, onset of a new cough, night sweats or further bladder spasms or inability to urinate    Return for RTC on 10/11/2017 for cystoscopy .  Zara Council, PA-C  [facility] [address] [place], [place] [address] [phone]

## 2017-07-26 NOTE — Progress Notes (Signed)
BCG Bladder Instillation  BCG # 6  Due to Bladder Cancer patient is present today for a BCG treatment. Patient was cleaned and prepped in a sterile fashion with betadine and lidocaine 2% jelly was instilled into the urethra.  A 14FR catheter was inserted, urine return was noted 9ml, urine was yellow in color.  4ml of reconstituted BCG was instilled into the bladder. The catheter was then removed. Patient tolerated well, no complications were noted  Preformed by: Zara Council PA-C, [place], [place]

## 2017-07-31 ENCOUNTER — Encounter: Payer: Self-pay | Admitting: Urology

## 2017-10-11 ENCOUNTER — Ambulatory Visit (INDEPENDENT_AMBULATORY_CARE_PROVIDER_SITE_OTHER): Payer: Medicare Other | Admitting: Urology

## 2017-10-11 ENCOUNTER — Encounter: Payer: Self-pay | Admitting: Urology

## 2017-10-11 VITALS — BP 103/57 | HR 102 | Ht 71.0 in | Wt 292.0 lb

## 2017-10-11 DIAGNOSIS — Z8551 Personal history of malignant neoplasm of bladder: Secondary | ICD-10-CM

## 2017-10-11 LAB — MICROSCOPIC EXAMINATION: EPITHELIAL CELLS (NON RENAL): NONE SEEN /HPF (ref 0–10)

## 2017-10-11 LAB — URINALYSIS, COMPLETE
Bilirubin, UA: NEGATIVE
Glucose, UA: NEGATIVE
Leukocytes, UA: NEGATIVE
Nitrite, UA: NEGATIVE
PH UA: 6 (ref 5.0–7.5)
RBC, UA: NEGATIVE
Specific Gravity, UA: 1.02 (ref 1.005–1.030)
Urobilinogen, Ur: 1 mg/dL (ref 0.2–1.0)

## 2017-10-11 MED ORDER — CIPROFLOXACIN HCL 500 MG PO TABS
500.0000 mg | ORAL_TABLET | Freq: Once | ORAL | Status: AC
  Administered 2017-10-11: 500 mg via ORAL

## 2017-10-11 MED ORDER — LIDOCAINE HCL 2 % EX GEL
1.0000 "application " | Freq: Once | CUTANEOUS | Status: AC
  Administered 2017-10-11: 1 via URETHRAL

## 2017-10-11 NOTE — Progress Notes (Signed)
2:34 PM  10/11/17  [name] Hardin [birth year] [id number]  Referring provider: [name], MD [facility] [place], [place] [address]  Chief Complaint  Patient presents with  . Cysto    HPI: [name] with history of bladder cancer who returns today for surveillance cystoscopy.  CT urogram on 07/02/2014 showed a 16 mm bladder stone as well as a 1 cm area on the anterior wall of the bladder suspicious for bladder carcinoma. He was taken to the operating room on 1/227/2016 for cystolitholapaxy and TURBT.At this time, the stone was completely fragmented and removed and as small 1 cm well-circumscribed frondular tumor was noted and the LEFT anterior wall of the bladder. 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  He completed BCG x 6 (induction course) on 10/2014.  Most recent upper tract imaging on 2/18 --> RUS negative other than bilateral bosniak I renal cyst.    He returned to the operating room on 05/18/2017 and underwent biopsy of this lesion.  Additionally, he underwent bilateral retrogrades.  It was a filling defect, likely an air bubble in the left collecting system and had a negative diagnostic ureteroscopy.  Surgical pathology consistent with high-grade Ta disease.  More recently he completed a second induction course of BCG x 6 in 05/2017-07/2017.  Smoking history of 3ppd x >55yrs. Quit 13 years ago.   PMH: Past Medical History:  Diagnosis Date  . ATRIAL FIBRILLATION 03/05/2010   Qualifier: Diagnosis of  By: Lovena Le, MD, Martyn Malay   . Benign hypertension 12/16/2013  . Cancer ([place])    bladder  . Chronic obstructive pulmonary disease ([place]) 12/16/2013  . COPD (chronic obstructive pulmonary disease) ([place])   . Family history of adverse reaction to anesthesia    Brother has a  difficult time waking up.  Marland Kitchen Hx pulmonary embolism 08/2000  . Hypercholesterolemia   . Hyperglycemia   . Hypertension   . Osteoarthritis   . Pre-diabetes   . PREMATURE VENTRICULAR CONTRACTIONS 03/05/2010   Qualifier: Diagnosis of  By: Lovena Le, MD, Martyn Malay   . Pulmonary embolism ([place]) 12/16/2013  . Stroke [facility]) 1997    Surgical History: Past Surgical History:  Procedure Laterality Date  . CYSTOSCOPY W/ RETROGRADES Bilateral 05/18/2017   Procedure: CYSTOSCOPY WITH RETROGRADE PYELOGRAM;  Surgeon: Hollice Espy, MD;  Location: [facility] ORS;  Service: Urology;  Laterality: Bilateral;  . CYSTOSCOPY WITH BIOPSY N/A 05/18/2017   Procedure: CYSTOSCOPY WITH BIOPSY;  Surgeon: Hollice Espy, MD;  Location: [facility] ORS;  Service: Urology;  Laterality: N/A;  . JOINT REPLACEMENT Right   . PERIPHERAL VASCULAR CATHETERIZATION N/A 02/04/2015   Procedure: IVC Filter Removal;  Surgeon: Katha Cabal, MD;  Location: [facility];  Service: Cardiovascular;  Laterality: N/A;  . TONSILLECTOMY    . URETEROSCOPY Left 05/18/2017   Procedure: URETEROSCOPY;  Surgeon: Hollice Espy, MD;  Location: [facility] ORS;  Service: Urology;  Laterality: Left;    Home Medications:  Allergies as of 10/11/2017   No Known Allergies     Medication List        Accurate as of 10/11/17  2:34 PM. Always use your most recent med list.          ADVAIR DISKUS 250-50 MCG/DOSE Aepb Generic drug:  Fluticasone-Salmeterol Inhale 1 puff into the lungs 2 (two) times daily.  allopurinol 300 MG tablet Commonly known as:  ZYLOPRIM Take 300 mg by mouth daily. In am   aspirin 81 MG tablet Take 81 mg by mouth daily.   COLCRYS 0.6 MG tablet Generic drug:  colchicine Take 0.6 mg by mouth every other day.   diltiazem 240 MG 24 hr capsule Commonly known as:  CARDIZEM CD Take 240 mg by mouth daily. In am   furosemide 20 MG tablet Commonly known as:  LASIX Take 20 mg by mouth daily. In am   MULTI-DAY Tabs Take 1  tablet by mouth daily. In am   potassium chloride 10 MEQ CR tablet Commonly known as:  KLOR-CON Take 10 mEq by mouth daily. In am   simvastatin 40 MG tablet Commonly known as:  ZOCOR Take 40 mg by mouth every morning.   SPIRIVA HANDIHALER 18 MCG inhalation capsule Generic drug:  tiotropium Place 18 mcg into inhaler and inhale daily. In am   warfarin 5 MG tablet Commonly known as:  COUMADIN Take 5 mg by mouth daily at 6 PM.       Allergies: No Known Allergies  Family History: Family History  Problem Relation Age of Onset  . Prostate cancer Neg Hx   . Kidney cancer Neg Hx     Social History:  reports that he quit smoking about 26 years ago. He has never used smokeless tobacco. He reports that he does not drink alcohol or use drugs.   Physical Exam: BP (!) 103/57   Pulse (!) 102   Ht 5\' 11"  (1.803 m)   Wt 292 lb (132.5 kg)   BMI 40.73 kg/m   Constitutional:  Alert and oriented, No acute distress. HEENT: [place] AT, moist mucus membranes.  Trachea midline, no masses. Cardiovascular: No clubbing, cyanosis, or edema. Respiratory: Normal respiratory effort, no increased work of breathing. GI: Abdomen is soft, nontender, nondistended, no abdominal masses GU: No CVA tenderness. Normal phallus, orthotopic meatus. Skin: No rashes, bruises or suspicious lesions. Neurologic: Grossly intact, no focal deficits, moving all 4 extremities. Psychiatric: Normal mood and affect.  Laboratory Data: Cr 1.2 (baseline)  Urinalysis See epic.    Cystoscopy Procedure Note  Patient identification was confirmed, informed consent was obtained, and patient was prepped using Betadine solution.  Lidocaine jelly was administered per urethral meatus.    Preoperative abx where received prior to procedure.     Pre-Procedure: - Inspection reveals a normal caliber ureteral meatus.  Procedure: The flexible cystoscope was introduced without difficulty - No urethral strictures/lesions are  present. - Enlarged prostate with bilobar coapation - Elevated bladder neck - Bilateral ureteral orifices identified - Bladder mucosa  reveals several stellate scars without any obvious papillary recurrence or concerning lesions - No bladder stones - Mild trabeculation  Retroflexion shows no obvious tumor   Post-Procedure: - Patient tolerated the procedure well   Assessment & Plan:    1. History of bladder cancer Negative cystoscopy today status post recent repeat induction course of BCG We will continue to survey on a every 3 month interval for the time being Given national back order of BCG, will defer maintenance BCG for the time being, reassess once additional supplies available - Urinalysis, Complete - ciprofloxacin (CIPRO) tablet 500 mg - lidocaine (XYLOCAINE) 2 % jelly 1 application   Hollice Espy, MD  [facility] [address] [place], [place] [address] [phone]

## 2018-01-24 ENCOUNTER — Encounter: Payer: Self-pay | Admitting: Urology

## 2018-01-24 ENCOUNTER — Ambulatory Visit (INDEPENDENT_AMBULATORY_CARE_PROVIDER_SITE_OTHER): Payer: Medicare Other | Admitting: Urology

## 2018-01-24 VITALS — BP 131/74 | HR 83 | Ht 71.0 in | Wt 286.0 lb

## 2018-01-24 DIAGNOSIS — C679 Malignant neoplasm of bladder, unspecified: Secondary | ICD-10-CM

## 2018-01-24 LAB — URINALYSIS, COMPLETE
Bilirubin, UA: NEGATIVE
GLUCOSE, UA: NEGATIVE
Ketones, UA: NEGATIVE
LEUKOCYTES UA: NEGATIVE
Nitrite, UA: NEGATIVE
PH UA: 5.5 (ref 5.0–7.5)
PROTEIN UA: NEGATIVE
RBC, UA: NEGATIVE
Specific Gravity, UA: 1.02 (ref 1.005–1.030)
UUROB: 1 mg/dL (ref 0.2–1.0)

## 2018-01-24 LAB — MICROSCOPIC EXAMINATION

## 2018-01-24 MED ORDER — LIDOCAINE HCL URETHRAL/MUCOSAL 2 % EX GEL
1.0000 "application " | Freq: Once | CUTANEOUS | Status: AC
  Administered 2018-01-24: 1 via URETHRAL

## 2018-01-24 MED ORDER — CIPROFLOXACIN HCL 500 MG PO TABS
500.0000 mg | ORAL_TABLET | Freq: Once | ORAL | Status: AC
  Administered 2018-01-24: 500 mg via ORAL

## 2018-01-24 NOTE — Progress Notes (Signed)
    2:42 PM  01/24/18  [name] Hardin 02/22/1932 [id number]  Referring provider: [name], MD [place] [facility] [place], [place] [address]  Chief Complaint  Patient presents with  . Cysto    HPI: [name] with history of bladder cancer who returns today for surveillance cystoscopy.  CT urogram on 07/02/2014 showed a 16 mm bladder stone as well as a 1 cm area on the anterior wall of the bladder suspicious for bladder carcinoma. He was taken to the operating room on 1/227/2016 for cystolitholapaxy and TURBT.At this time, the stone was completely fragmented and removed and as small 1 cm well-circumscribed frondular tumor was noted and the LEFT anterior wall of the bladder. 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  He completed BCG x 6 (induction course) on 10/2014.  Most recent upper tract imaging on 2/18 --> RUS negative other than bilateral bosniak I renal cyst.    He returned to the operating room on 05/18/2017 and underwent biopsy of this lesion.  Additionally, he underwent bilateral retrogrades.  It was a filling defect, likely an air bubble in the left collecting system and had a negative diagnostic ureteroscopy.  Surgical pathology consistent with high-grade Ta disease.  More recently he completed a second induction course of BCG x 6 in 05/2017-07/2017.  Smoking history of 3ppd x >71yrs. Quit 13 years ago.  He is asking today whether his bladder cancer is possibly caused by Roundup.  He returns today for routine surveillance cystoscopy.   Physical Exam: BP 131/74   Pulse 83   Ht 5\' 11"  (1.803 m)   Wt 286 lb (129.7 kg)   BMI 39.89 kg/m   Constitutional:  Alert and oriented, No acute distress. HEENT: [place] AT, moist mucus membranes.  Trachea midline, no masses. Cardiovascular: No clubbing,  cyanosis, or edema. Respiratory: Normal respiratory effort, no increased work of breathing. GU: No CVA tenderness. Normal phallus, orthotopic meatus. Skin: No rashes, bruises or suspicious lesions. Neurologic: Grossly intact, no focal deficits, moving all 4 extremities. Psychiatric: Normal mood and affect.  Laboratory Data: Cr 1.2 (baseline)  Urinalysis See epic.    Cystoscopy Procedure Note  Patient identification was confirmed, informed consent was obtained, and patient was prepped using Betadine solution.  Lidocaine jelly was administered per urethral meatus.    Preoperative abx where received prior to procedure.     Pre-Procedure: - Inspection reveals a normal caliber ureteral meatus.  Procedure: The flexible cystoscope was introduced without difficulty - No urethral strictures/lesions are present. - Enlarged prostate with bilobar coapation - Elevated bladder neck - Bilateral ureteral orifices identified - Bladder mucosa  reveals several stellate scars without any obvious papillary recurrence or concerning lesions - No bladder stones - Mild trabeculation  Retroflexion shows no obvious tumor   Post-Procedure: - Patient tolerated the procedure well   Assessment & Plan:    1. History of bladder cancer Negative cystoscopy today  We will continue weekly 29-month surveillance cystoscopy We will continue to defer her maintenance BCG until national back order has resolved, he is agreeable this - Urinalysis, Complete - ciprofloxacin (CIPRO) tablet 500 mg - lidocaine (XYLOCAINE) 2 % jelly 1 application  Return in about 3 months (around 04/26/2018) for cysto.   Hollice Espy, MD  [facility] [address] [name], [place] [address] [phone]

## 2018-02-20 IMAGING — CT CT ANGIO CHEST
2 of 6 series · 17 of 46 positions shown · IV contrast (APPLIED)
Comparison: 11/30/2011 CT chest.

CLINICAL DATA: 85 y/o M; one week of shortness of breath with left
leg swelling. Undergoing treatment for bladder cancer.

EXAM:
CT ANGIOGRAPHY CHEST WITH CONTRAST
TECHNIQUE: Multidetector CT imaging of the chest was performed using the
standard protocol during bolus administration of intravenous
contrast. Multiplanar CT image reconstructions and MIPs were
obtained to evaluate the vascular anatomy.
CONTRAST:  75mL WOJHUC-LVO IOPAMIDOL (WOJHUC-LVO) INJECTION 76%

[Series 5: thins · axial · 0.91mm/px · z∈[-374,-101]mm · 14 of 299 slices shown]
[im 13/299  lung]
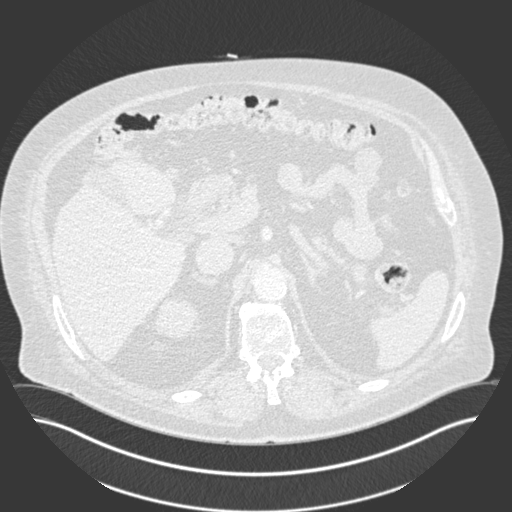
[im 39/299  soft-tissue]
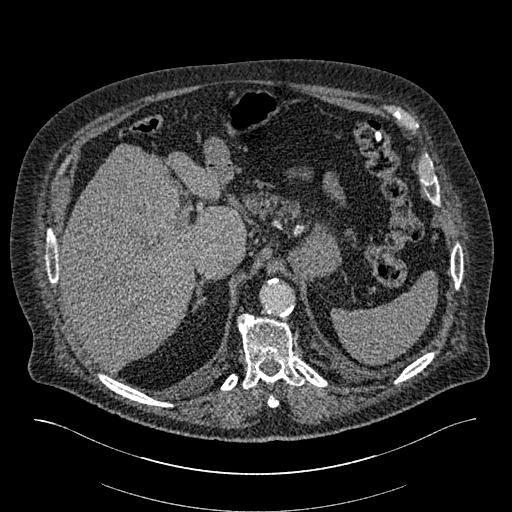
[im 52/299  lung]
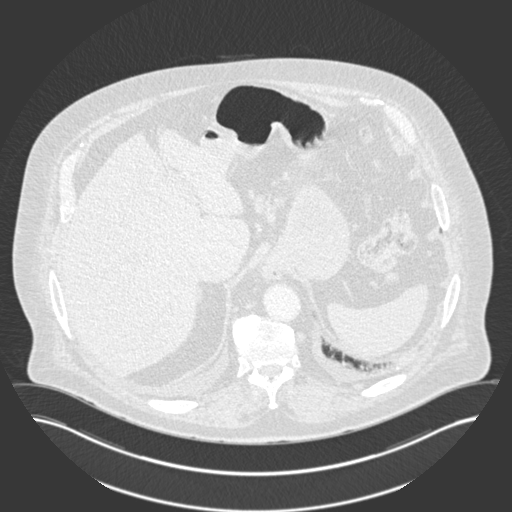
[im 78/299  soft-tissue]
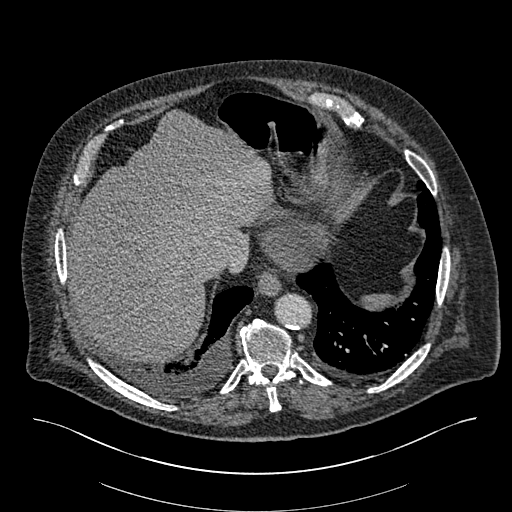
[im 104/299  lung]
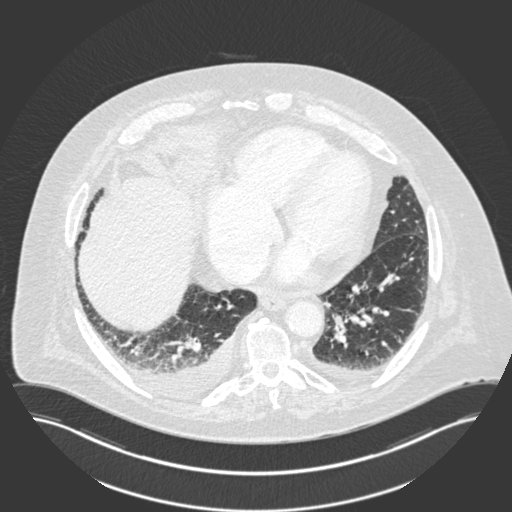
[im 117/299  soft-tissue]
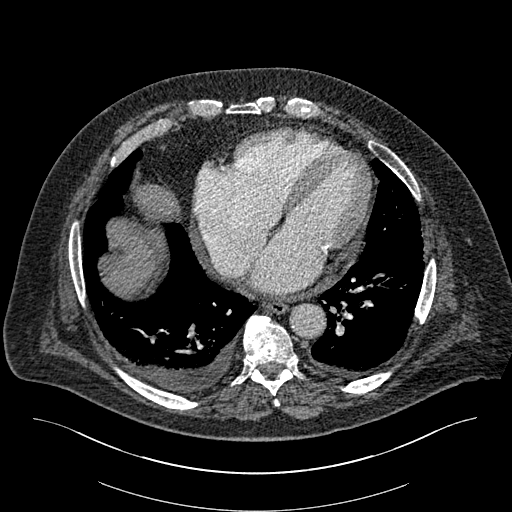
[im 143/299  lung]
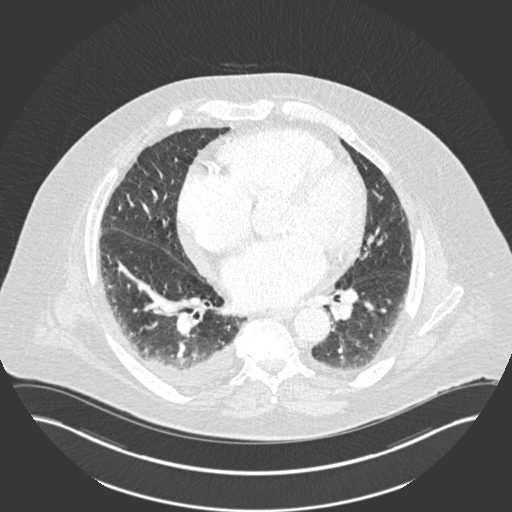
[im 156/299  soft-tissue]
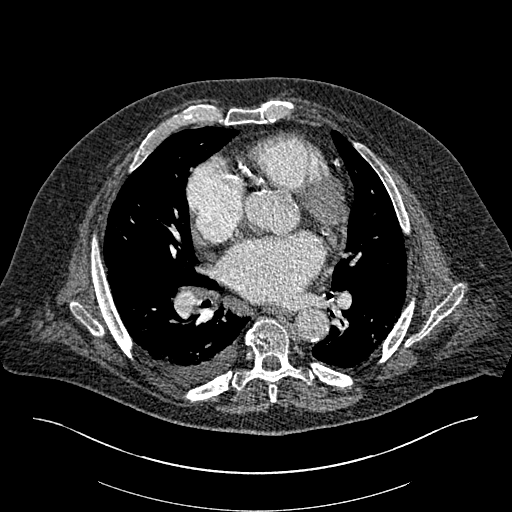
[im 182/299  lung]
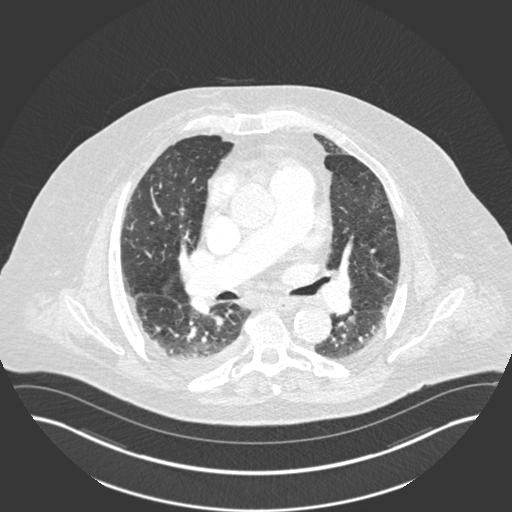
[im 195/299  soft-tissue]
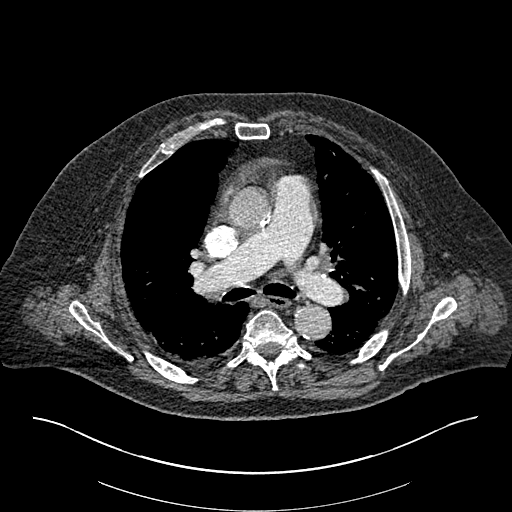
[im 221/299  lung]
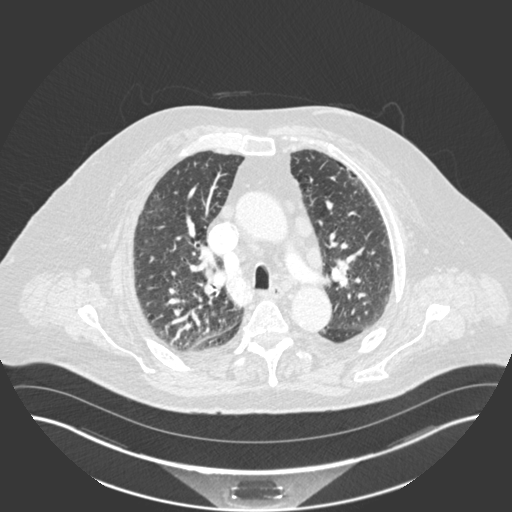
[im 247/299  soft-tissue]
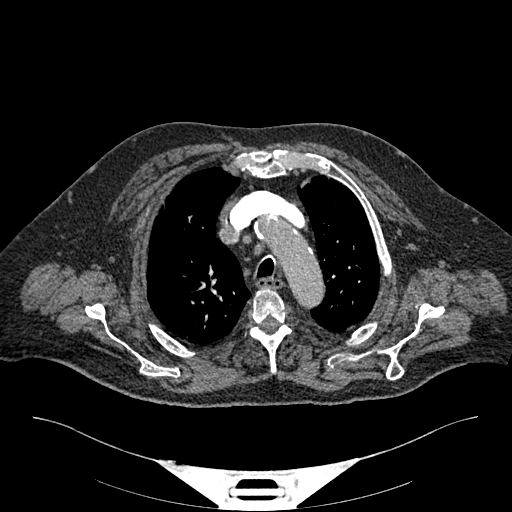
[im 260/299  lung]
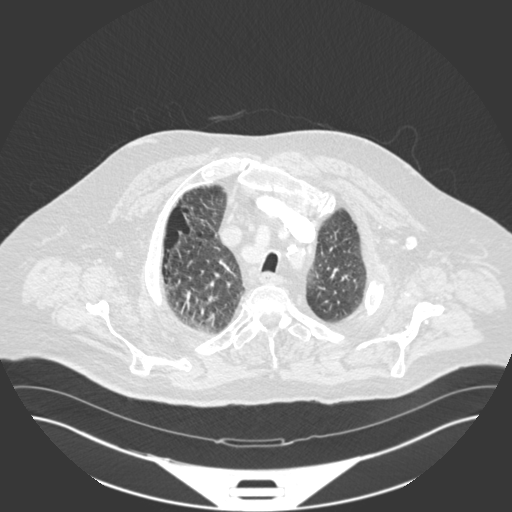
[im 286/299  soft-tissue]
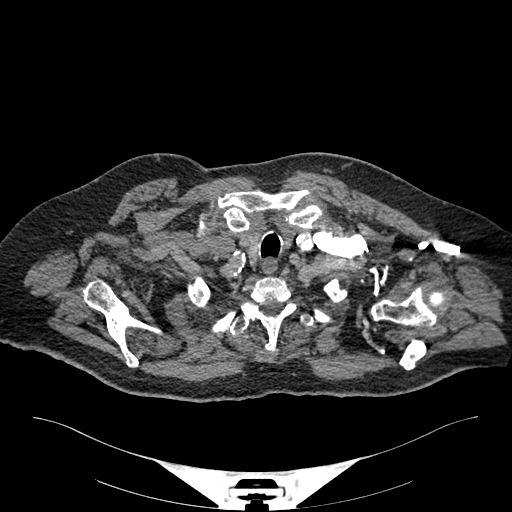

[Series 7: coronal mpr · coronal · 0.58mm/px · 3 of 106 slices shown]
[im 27/106  soft-tissue]
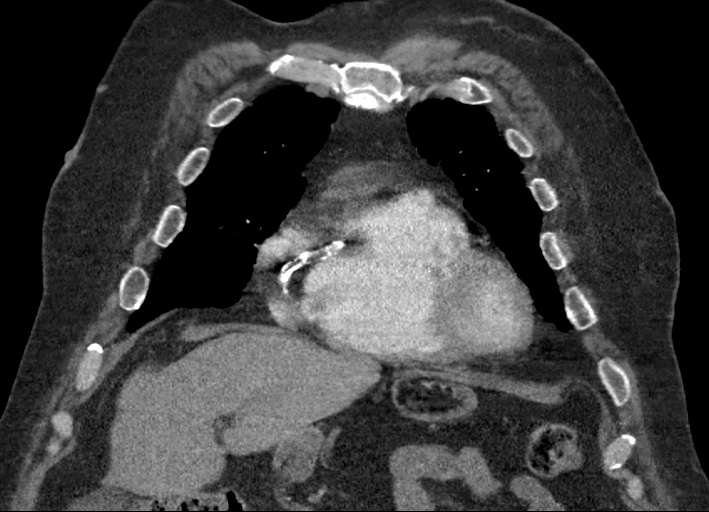
[im 53/106  soft-tissue]
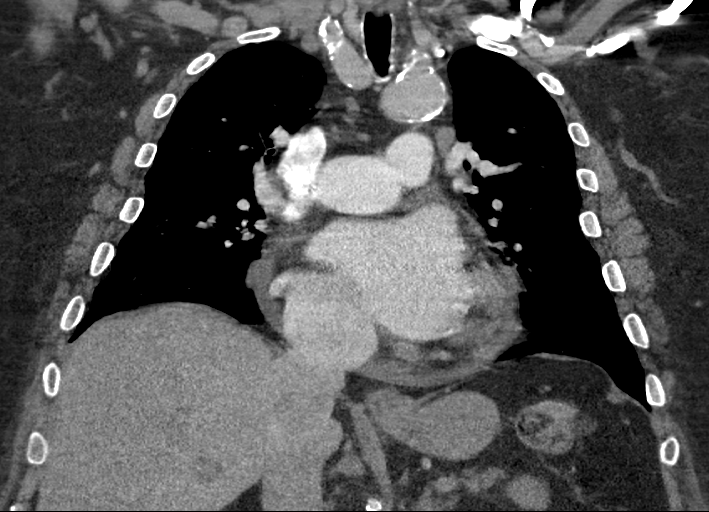
[im 79/106  soft-tissue]
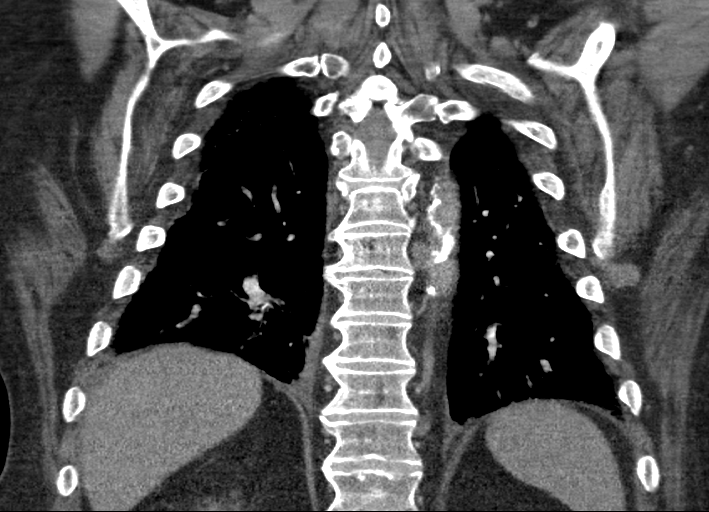

[17 of 46 positions shown; findings below may reference images not displayed]

FINDINGS: Cardiovascular: Moderate cardiomegaly. Small pericardial effusion.
Severe coronary artery calcification. 3.8 cm ascending aorta with
mild calcific atherosclerosis.

Satisfactory opacification of the pulmonary arteries. Mild
respiratory motion in the lung bases. No pulmonary embolus
identified. 4 cm main pulmonary artery.

Mediastinum/Nodes: Mild prominence of mediastinal lymph nodes likely
related to pulmonary vascular congestion. Normal thyroid gland,
trachea, and thoracic esophagus.

Lungs/Pleura: Smooth interlobular septal thickening compatible with
interstitial edema. Mild paraseptal emphysema of lung apices. Small
bilateral pleural effusions. No consolidation. Scattered mucous
plugging greatest in the lower lobes.

Upper Abdomen: Cholelithiasis. Partially visualized pericholecystic
edema. Small hiatal hernia.

Musculoskeletal: No acute fracture. Small T7 vertebral body
hemangioma. Mild thoracic spine spondylosis.

Review of the MIP images confirms the above findings.
IMPRESSION: 1. No pulmonary embolus identified. Suboptimal assessment of lung
base segmental arteries due to motion artifact.
2. Partially visualized cholelithiasis and pericholecystic edema may
represent acute cholecystitis, consider correlation right upper
quadrant ultrasound.
3. Interstitial pulmonary edema. Small bilateral pleural effusions.
4. Moderate cardiomegaly.  Small pericardial effusion.
5. Mild aortic and severe coronary artery calcific atherosclerosis.
6. Enlarged main pulmonary artery compatible with pulmonary artery
hypertension.

These results will be called to the ordering clinician or
representative by the Radiologist Assistant, and communication
documented in the PACS or zVision Dashboard.

By: Tjitavi Jenneker M.D.

## 2018-04-25 ENCOUNTER — Encounter: Payer: Self-pay | Admitting: Urology

## 2018-04-25 ENCOUNTER — Ambulatory Visit (INDEPENDENT_AMBULATORY_CARE_PROVIDER_SITE_OTHER): Payer: Medicare Other | Admitting: Urology

## 2018-04-25 VITALS — BP 134/72 | HR 121 | Ht 71.0 in | Wt 290.0 lb

## 2018-04-25 DIAGNOSIS — C679 Malignant neoplasm of bladder, unspecified: Secondary | ICD-10-CM

## 2018-04-25 LAB — MICROSCOPIC EXAMINATION
Bacteria, UA: NONE SEEN
EPITHELIAL CELLS (NON RENAL): NONE SEEN /HPF (ref 0–10)
RBC, UA: NONE SEEN /hpf (ref 0–2)
WBC UA: NONE SEEN /HPF (ref 0–5)

## 2018-04-25 LAB — URINALYSIS, COMPLETE
BILIRUBIN UA: NEGATIVE
Glucose, UA: NEGATIVE
Ketones, UA: NEGATIVE
Nitrite, UA: NEGATIVE
PH UA: 5 (ref 5.0–7.5)
Protein, UA: NEGATIVE
RBC UA: NEGATIVE
Specific Gravity, UA: 1.02 (ref 1.005–1.030)
UUROB: 0.2 mg/dL (ref 0.2–1.0)

## 2018-04-25 NOTE — Progress Notes (Signed)
    10:16 AM  04/25/18  Len Azeez Polkowski [birth year] [id number]  Referring provider: [name], MD [place] [facility] [place], [place] [address]  Chief Complaint  Patient presents with  . Cysto    HPI: [name] with history of bladder cancer who returns today for surveillance cystoscopy.  CT urogram on 07/02/2014 showed a 16 mm bladder stone as well as a 1 cm area on the anterior wall of the bladder suspicious for bladder carcinoma. He was taken to the operating room on 1/227/2016 for cystolitholapaxy and TURBT.At this time, the stone was completely fragmented and removed and as small 1 cm well-circumscribed frondular tumor was noted and the LEFT anterior wall of the bladder. 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  He completed BCG x 6 (induction course) on 10/2014.  Most recent upper tract imaging on 2/18 --> RUS negative other than bilateral bosniak I renal cyst.    He returned to the operating room on 05/18/2017 and underwent biopsy of this lesion.  Additionally, he underwent bilateral retrogrades.  It was a filling defect, likely an air bubble in the left collecting system and had a negative diagnostic ureteroscopy.  Surgical pathology consistent with high-grade Ta disease.  More recently he completed a second induction course of BCG x 6 in 05/2017-07/2017.  Smoking history of 3ppd x >5yrs. Quit 13 years ago.    He returns today for routine surveillance cystoscopy.   Physical Exam: Blood pressure 134/72, pulse (!) 121, height 5\' 11"  (1.803 m), weight 290 lb (131.5 kg). Constitutional:  Alert and oriented, No acute distress. HEENT: [place] AT, moist mucus membranes.  Trachea midline, no masses. Cardiovascular: No clubbing, cyanosis, or edema. Respiratory: Normal respiratory effort, no increased work  of breathing. GU: No CVA tenderness. Normal phallus, orthotopic meatus. Skin: No rashes, bruises or suspicious lesions. Neurologic: Grossly intact, no focal deficits, moving all 4 extremities. Psychiatric: Normal mood and affect.  Laboratory Data: Cr 1.2 (baseline)  Urinalysis See epic.    Cystoscopy Procedure Note  Patient identification was confirmed, informed consent was obtained, and patient was prepped using Betadine solution.  Lidocaine jelly was administered per urethral meatus.    Preoperative abx where received prior to procedure.     Pre-Procedure: - Inspection reveals a normal caliber ureteral meatus.  Procedure: The flexible cystoscope was introduced without difficulty - No urethral strictures/lesions are present. - Enlarged prostate with bilobar coapation - Elevated bladder neck - Bilateral ureteral orifices identified - Bladder mucosa  reveals several stellate scars without any obvious papillary recurrence or concerning lesions - No bladder stones - Mild trabeculation  Retroflexion shows no obvious tumor   Post-Procedure: - Patient tolerated the procedure well   Assessment & Plan:    1. History of bladder cancer Negative cystoscopy today  We will continue 71-month surveillance cystoscopy until 05/2019 then back off to q6 months We will continue to defer her maintenance BCG until national back order has resolved, he is agreeable this - Urinalysis, Complete - ciprofloxacin (CIPRO) tablet 500 mg - lidocaine (XYLOCAINE) 2 % jelly 1 application  Return in about 3 months (around 07/26/2018) for cysto.   Hollice Espy, MD  [facility] [address] [place], [place] [address] [phone]

## 2018-07-25 NOTE — Progress Notes (Signed)
07/26/2018  9:42 PM   [name] Hardin [place]ill 08-11-1931 [id number]  Referring provider: [name], MD [facility] [place], [place] [address]  Chief Complaint  Patient presents with  . Cysto    HPI: [name] Hardin is a 85 y.o. male with history of bladder cancer who returns today for surveillance cystoscopy.  CT urogram on 07/02/2014 showed a 16 mm bladder stone as well as a 1 cm area on the anterior wall of the bladder suspicious for bladder carcinoma. He was taken to the operating room on 1/227/2016 for cystolitholapaxy and TURBT.At this time, the stone was completely fragmented and removed and as small 1 cm well-circumscribed frondular tumor was noted and the LEFT anterior wall of the bladder. 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  He completed BCG x 6 (induction course) on 10/2014.  Most recent upper tract imaging on 2/18 --> RUS negative other than bilateral bosniak I renal cyst.    He returned to the operating room on 05/18/2017 and underwent biopsy of this lesion.  Additionally, he underwent bilateral retrogrades.  It was a filling defect, likely an air bubble in the left collecting system and had a negative diagnostic ureteroscopy.  Surgical pathology consistent with high-grade Ta disease.  More recently he completed a second induction course of BCG x 6 in 05/2017-07/2017.  Smoking history of 3ppd x >62yrs. Quit 13 years ago.    He returns today for routine surveillance cystoscopy.   Physical Exam: Blood pressure 134/72, pulse (!) 121, height 5\' 11"  (1.803 m), weight 290 lb (131.5 kg). Constitutional:  Alert and oriented, No acute distress. HEENT: [name] Hardin AT, moist mucus membranes.  Trachea midline, no masses. Cardiovascular: No clubbing, cyanosis, or edema. Respiratory: Normal respiratory  effort, no increased work of breathing. GU: No CVA tenderness. Normal phallus, orthotopic meatus. Skin: No rashes, bruises or suspicious lesions. Neurologic: Grossly intact, no focal deficits, moving all 4 extremities. Psychiatric: Normal mood and affect.  Laboratory Data: Cr 1.2 (baseline)  Urinalysis See epic.    Cystoscopy Procedure Note  Patient identification was confirmed, informed consent was obtained, and patient was prepped using Betadine solution.  Lidocaine jelly was administered per urethral meatus.    Preoperative abx where received prior to procedure.    Pre-Procedure: - Inspection reveals a normal caliber ureteral meatus.  Procedure: The flexible cystoscope was introduced without difficulty - No urethral strictures/lesions are present. - Enlarged prostate with bilobar coapation - Elevated bladder neck - Bilateral ureteral orifices identified - Bladder mucosa  reveals several stellate scars without any obvious papillary recurrence or concerning lesions - No bladder stones - Mild trabeculation  Retroflexion shows no obvious tumor  Post-Procedure: - Patient tolerated the procedure well  Assessment & Plan:    1. History of bladder cancer Negative cystoscopy today  We will continue 12-month surveillance cystoscopy until 05/2019 then back off to q6 months We will continue to defer her maintenance BCG until national back order has resolved, he is agreeable this - Urinalysis, Complete - ciprofloxacin (CIPRO) tablet 500 mg - lidocaine (XYLOCAINE) 2 % jelly 1 application  Return in about 3 months (around 10/25/2018) for Cysto.   [name] Espy, MD  [facility] [address] [place], [place] [address] [phone]  I, [name] Hardin, am acting as a scribe for [name] Espy, MD.  I have reviewed the above documentation for accuracy and completeness, and I agree with the above.   [name] Espy, MD

## 2018-07-26 ENCOUNTER — Ambulatory Visit (INDEPENDENT_AMBULATORY_CARE_PROVIDER_SITE_OTHER): Payer: Medicare Other | Admitting: Urology

## 2018-07-26 ENCOUNTER — Encounter: Payer: Self-pay | Admitting: Urology

## 2018-07-26 VITALS — BP 122/57 | HR 118 | Ht 71.0 in | Wt 283.0 lb

## 2018-07-26 DIAGNOSIS — Z8551 Personal history of malignant neoplasm of bladder: Secondary | ICD-10-CM

## 2018-07-26 LAB — URINALYSIS, COMPLETE
Bilirubin, UA: NEGATIVE
Glucose, UA: NEGATIVE
Nitrite, UA: NEGATIVE
RBC, UA: NEGATIVE
Specific Gravity, UA: 1.025 (ref 1.005–1.030)
Urobilinogen, Ur: 1 mg/dL (ref 0.2–1.0)
pH, UA: 6.5 (ref 5.0–7.5)

## 2018-07-26 LAB — MICROSCOPIC EXAMINATION
Epithelial Cells (non renal): NONE SEEN /hpf (ref 0–10)
RBC, UA: NONE SEEN /hpf (ref 0–2)

## 2018-10-26 ENCOUNTER — Other Ambulatory Visit: Payer: Medicare Other | Admitting: Urology

## 2019-01-24 ENCOUNTER — Other Ambulatory Visit: Payer: Medicare Other | Admitting: Urology

## 2019-01-30 ENCOUNTER — Ambulatory Visit (INDEPENDENT_AMBULATORY_CARE_PROVIDER_SITE_OTHER): Payer: Medicare Other | Admitting: Urology

## 2019-01-30 ENCOUNTER — Encounter: Payer: Self-pay | Admitting: Urology

## 2019-01-30 ENCOUNTER — Other Ambulatory Visit: Payer: Self-pay

## 2019-01-30 VITALS — BP 98/58 | HR 71 | Ht 71.0 in | Wt 276.0 lb

## 2019-01-30 DIAGNOSIS — Z8551 Personal history of malignant neoplasm of bladder: Secondary | ICD-10-CM

## 2019-01-30 NOTE — Progress Notes (Signed)
01/30/19 1:35 PM   [name] Hardin 11-24-1931 [id number]  Referring provider: [name], MD [place] [facility] [place],  [place] [address]  Chief Complaint  Patient presents with  . Cysto    HPI: [name] Hardin is a 85 y.o. male with history of bladder cancer who returns today for surveillance cystoscopy.  CT urogram on 07/02/2014 showed a 16 mm bladder stone as well as a 1 cm area on the anterior wall of the bladder suspicious for bladder carcinoma. He was taken to the operating room on 1/227/2016 for cystolitholapaxy and TURBT.At this time, the stone was completely fragmented and removed and as small 1 cm well-circumscribed frondular tumor was noted and the LEFT anterior wall of the bladder. 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  He completed BCG x 6 (induction course) on 10/2014.  Most recent upper tract imaging on 2/18 --> RUS negative other than bilateral bosniak I renal cyst.    He returned to the operating room on 05/18/2017 and underwent biopsy of this lesion.  Additionally, he underwent bilateral retrogrades.  It was a filling defect, likely an air bubble in the left collecting system and had a negative diagnostic ureteroscopy.  Surgical pathology consistent with high-grade Ta disease.  More recently he completed a second induction course of BCG x 6 in 05/2017-07/2017.  Smoking history of 3ppd x >39yrs. Quit 13 years ago.    He returns today for routine surveillance cystoscopy.  He was due for this 106-month cystoscopy in April, however in light of COVID-19, this procedure was pushed out until today.   Physical Exam: Blood pressure (!) 98/58, pulse 71, height 5\' 11"  (1.803 m), weight 276 lb (125.2 kg). Constitutional:  Alert and oriented, No acute distress. HEENT: [place] AT, moist mucus  membranes.  Trachea midline, no masses. Cardiovascular: No clubbing, cyanosis, or edema. Respiratory: Normal respiratory effort, no increased work of breathing. GU: No CVA tenderness. Normal phallus, orthotopic meatus. Skin: No rashes, bruises or suspicious lesions. Neurologic: Grossly intact, no focal deficits, moving all 4 extremities. Psychiatric: Normal mood and affect.  Laboratory Data: Cr 1.2 (baseline)  Urinalysis See epic.    Cystoscopy Procedure Note  Patient identification was confirmed, informed consent was obtained, and patient was prepped using Betadine solution.  Lidocaine jelly was administered per urethral meatus.    Preoperative abx where received prior to procedure.    Pre-Procedure: - Inspection reveals a normal caliber ureteral meatus.  Procedure: The flexible cystoscope was introduced without difficulty - No urethral strictures/lesions are present. - Enlarged prostate with bilobar coapation - Elevated bladder neck - Bilateral ureteral orifices identified - Bladder mucosa  reveals several stellate scars without any obvious papillary recurrence or concerning lesions, including at the dome of the bladder - No bladder stones - Mild trabeculation  Retroflexion shows no obvious tumor  Post-Procedure: - Patient tolerated the procedure well  Assessment & Plan:    1. History of bladder cancer Negative cystoscopy today   Urine cytology today  Discussed resuming maintenance BCG, however the patient had stopped doing this and light of national back order.  At this point time, is not interested in pursuing BCG treatment again.  Patient strongly desires to transition to every 6 month surveillance cystoscopies, given that this is the plan was to go ahead and transition to every 6 month cystoscopies on  05/2019, I have agreed to push our our next cystoscopy to 6 months at this point   - Urinalysis, Complete - ciprofloxacin (CIPRO) tablet 500 mg - lidocaine  (XYLOCAINE) 2 % jelly 1 application  Return in about 6 months (around 08/02/2019) for cysto.   Hollice Espy, MD  [facility] [address] [place], [place] [address] [phone]

## 2019-01-31 LAB — MICROSCOPIC EXAMINATION
Bacteria, UA: NONE SEEN
RBC, Urine: NONE SEEN /hpf (ref 0–2)

## 2019-01-31 LAB — URINALYSIS, COMPLETE
Bilirubin, UA: NEGATIVE
Glucose, UA: NEGATIVE
Ketones, UA: NEGATIVE
Nitrite, UA: NEGATIVE
RBC, UA: NEGATIVE
Specific Gravity, UA: 1.02 (ref 1.005–1.030)
Urobilinogen, Ur: 2 mg/dL — ABNORMAL HIGH (ref 0.2–1.0)
pH, UA: 7 (ref 5.0–7.5)

## 2019-02-05 ENCOUNTER — Other Ambulatory Visit: Payer: Self-pay | Admitting: Urology

## 2019-08-09 ENCOUNTER — Other Ambulatory Visit: Payer: Self-pay

## 2019-08-09 ENCOUNTER — Ambulatory Visit (INDEPENDENT_AMBULATORY_CARE_PROVIDER_SITE_OTHER): Payer: Medicare Other | Admitting: Urology

## 2019-08-09 ENCOUNTER — Encounter: Payer: Self-pay | Admitting: Urology

## 2019-08-09 VITALS — BP 142/88 | HR 78 | Ht 71.0 in | Wt 276.0 lb

## 2019-08-09 DIAGNOSIS — C679 Malignant neoplasm of bladder, unspecified: Secondary | ICD-10-CM

## 2019-08-09 NOTE — Progress Notes (Signed)
08/09/19 10:38 AM   [name] Hardin 02-Sep-1931 [id number]  Referring provider: [name], MD [place] [facility] [place],  [place] [address]  Chief Complaint  Patient presents with   Cysto    HPI: [name] Hardin is a 86 y.o. male with history of bladder cancer who returns today for surveillance cystoscopy.  CT urogram on 07/02/2014 showed a 16 mm bladder stone as well as a 1 cm area on the anterior wall of the bladder suspicious for bladder carcinoma. He was taken to the operating room on 1/227/2016 for cystolitholapaxy and TURBT.At this time, the stone was completely fragmented and removed and as small 1 cm well-circumscribed frondular tumor was noted and the LEFT anterior wall of the bladder. 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  He completed BCG x 6 (induction course) on 10/2014.  Most recent upper tract imaging on 2/18 --> RUS negative other than bilateral bosniak I renal cyst.    He returned to the operating room on 05/18/2017 and underwent biopsy of this lesion.  Additionally, he underwent bilateral retrogrades.  It was a filling defect, likely an air bubble in the left collecting system and had a negative diagnostic ureteroscopy.  Surgical pathology consistent with high-grade Ta disease.  More recently he completed a second induction course of BCG x 6 in 05/2017-07/2017.  Smoking history of 3ppd x >81yrs. Quit 13 years ago.    He denies any urinary symptoms including no gross hematuria.  He mentions today that he lost his wife in late December.  This has been devastating for him.  He has good support with his 2 daughters and 2 sons who have been caring for him.  He is emotional today.   Physical Exam: Blood pressure (!) 98/58, pulse 71, height 5\' 11"  (1.803 m), weight 276 lb (125.2  kg). Constitutional:  Alert and oriented, No acute distress. HEENT: [place] AT, moist mucus membranes.  Trachea midline, no masses. Cardiovascular: No clubbing, cyanosis, or edema. Respiratory: Normal respiratory effort, no increased work of breathing. GU: No CVA tenderness. Normal phallus, orthotopic meatus. Skin: No rashes, bruises or suspicious lesions. Neurologic: Grossly intact, no focal deficits, moving all 4 extremities. Psychiatric: Normal mood and affect.  Laboratory Data: Cr 1.2 (baseline)  Urinalysis See epic.    Cystoscopy Procedure Note  Patient identification was confirmed, informed consent was obtained, and patient was prepped using Betadine solution.  Lidocaine jelly was administered per urethral meatus.    Preoperative abx where received prior to procedure.    Pre-Procedure: - Inspection reveals a normal caliber ureteral meatus.  Procedure: The flexible cystoscope was introduced without difficulty - No urethral strictures/lesions are present. - Enlarged prostate with bilobar coapation - Elevated bladder neck - Bilateral ureteral orifices identified - Bladder mucosa  reveals several stellate scars without any obvious papillary recurrence or concerning lesions, including at the dome of the bladder - No bladder stones - Mild trabeculation  Retroflexion shows no obvious tumor  Post-Procedure: - Patient tolerated the procedure well  Assessment & Plan:    1. History of bladder cancer Negative cystoscopy today   Declined further BCG  Patient strongly desires cystoscopy no more than 6 times a month.  Patient provided today with emotional support for loss of his wife.  - Urinalysis, Complete - ciprofloxacin (CIPRO) tablet 500 mg - lidocaine (XYLOCAINE) 2 %  jelly 1 application  Return for MD follow up CYSTO. in 6 months   Hollice Espy, MD  [facility] [address] [place], [place] [address] [phone]

## 2019-08-10 LAB — MICROSCOPIC EXAMINATION
Bacteria, UA: NONE SEEN
RBC, Urine: NONE SEEN /hpf (ref 0–2)

## 2019-08-10 LAB — URINALYSIS, COMPLETE
Bilirubin, UA: NEGATIVE
Ketones, UA: NEGATIVE
Leukocytes,UA: NEGATIVE
Nitrite, UA: NEGATIVE
RBC, UA: NEGATIVE
Specific Gravity, UA: >1.03 — ABNORMAL HIGH (ref 1.005–1.030)
Urobilinogen, Ur: 0.2 mg/dL (ref 0.2–1.0)
pH, UA: 5 (ref 5.0–7.5)

## 2019-10-08 ENCOUNTER — Other Ambulatory Visit: Payer: Self-pay | Admitting: Family Medicine

## 2019-10-08 DIAGNOSIS — G8929 Other chronic pain: Secondary | ICD-10-CM

## 2019-10-08 DIAGNOSIS — M545 Low back pain, unspecified: Secondary | ICD-10-CM

## 2019-10-15 ENCOUNTER — Other Ambulatory Visit: Payer: Self-pay

## 2019-10-15 ENCOUNTER — Ambulatory Visit
Admission: RE | Admit: 2019-10-15 | Discharge: 2019-10-15 | Disposition: A | Discharge status: Home / Self Care | Payer: Medicare Other | Source: Ambulatory Visit | Attending: Family Medicine | Admitting: Family Medicine

## 2019-10-15 DIAGNOSIS — M545 Low back pain: Secondary | ICD-10-CM | POA: Diagnosis present

## 2019-10-15 DIAGNOSIS — G8929 Other chronic pain: Secondary | ICD-10-CM | POA: Insufficient documentation

## 2019-10-19 ENCOUNTER — Other Ambulatory Visit
Admission: RE | Admit: 2019-10-19 | Discharge: 2019-10-19 | Disposition: A | Discharge status: Home / Self Care | Payer: Medicare Other | Source: Ambulatory Visit | Attending: Orthopedic Surgery | Admitting: Orthopedic Surgery

## 2019-10-19 ENCOUNTER — Other Ambulatory Visit: Payer: Self-pay | Admitting: Orthopedic Surgery

## 2019-10-19 DIAGNOSIS — Z01812 Encounter for preprocedural laboratory examination: Secondary | ICD-10-CM | POA: Diagnosis present

## 2019-10-19 DIAGNOSIS — Z20822 Contact with and (suspected) exposure to covid-19: Secondary | ICD-10-CM | POA: Diagnosis not present

## 2019-10-19 LAB — SARS CORONAVIRUS 2 (TAT 6-24 HRS): SARS Coronavirus 2: NEGATIVE

## 2019-10-23 ENCOUNTER — Ambulatory Visit: Payer: Medicare Other | Admitting: Registered Nurse

## 2019-10-23 ENCOUNTER — Encounter: Payer: Self-pay | Admitting: Orthopedic Surgery

## 2019-10-23 ENCOUNTER — Other Ambulatory Visit: Payer: Self-pay

## 2019-10-23 ENCOUNTER — Ambulatory Visit
Admission: RE | Admit: 2019-10-23 | Discharge: 2019-10-23 | Disposition: A | Discharge status: Home / Self Care | Payer: Medicare Other | Attending: Orthopedic Surgery | Admitting: Orthopedic Surgery

## 2019-10-23 ENCOUNTER — Encounter
Admission: RE | Disposition: A | Discharge status: Home / Self Care | Payer: Self-pay | Source: Home / Self Care | Attending: Orthopedic Surgery

## 2019-10-23 ENCOUNTER — Other Ambulatory Visit: Payer: Self-pay | Admitting: Orthopedic Surgery

## 2019-10-23 DIAGNOSIS — I4891 Unspecified atrial fibrillation: Secondary | ICD-10-CM | POA: Insufficient documentation

## 2019-10-23 DIAGNOSIS — X58XXXA Exposure to other specified factors, initial encounter: Secondary | ICD-10-CM | POA: Insufficient documentation

## 2019-10-23 DIAGNOSIS — G4730 Sleep apnea, unspecified: Secondary | ICD-10-CM | POA: Insufficient documentation

## 2019-10-23 DIAGNOSIS — Z7951 Long term (current) use of inhaled steroids: Secondary | ICD-10-CM | POA: Diagnosis not present

## 2019-10-23 DIAGNOSIS — M109 Gout, unspecified: Secondary | ICD-10-CM | POA: Diagnosis not present

## 2019-10-23 DIAGNOSIS — S32020A Wedge compression fracture of second lumbar vertebra, initial encounter for closed fracture: Secondary | ICD-10-CM | POA: Diagnosis not present

## 2019-10-23 DIAGNOSIS — Z79899 Other long term (current) drug therapy: Secondary | ICD-10-CM | POA: Diagnosis not present

## 2019-10-23 DIAGNOSIS — Z539 Procedure and treatment not carried out, unspecified reason: Secondary | ICD-10-CM | POA: Diagnosis not present

## 2019-10-23 DIAGNOSIS — E785 Hyperlipidemia, unspecified: Secondary | ICD-10-CM | POA: Diagnosis not present

## 2019-10-23 DIAGNOSIS — Z8249 Family history of ischemic heart disease and other diseases of the circulatory system: Secondary | ICD-10-CM | POA: Insufficient documentation

## 2019-10-23 DIAGNOSIS — E669 Obesity, unspecified: Secondary | ICD-10-CM | POA: Insufficient documentation

## 2019-10-23 DIAGNOSIS — Z7901 Long term (current) use of anticoagulants: Secondary | ICD-10-CM | POA: Insufficient documentation

## 2019-10-23 DIAGNOSIS — Z7982 Long term (current) use of aspirin: Secondary | ICD-10-CM | POA: Insufficient documentation

## 2019-10-23 DIAGNOSIS — J449 Chronic obstructive pulmonary disease, unspecified: Secondary | ICD-10-CM | POA: Insufficient documentation

## 2019-10-23 DIAGNOSIS — Z86711 Personal history of pulmonary embolism: Secondary | ICD-10-CM | POA: Insufficient documentation

## 2019-10-23 DIAGNOSIS — I10 Essential (primary) hypertension: Secondary | ICD-10-CM | POA: Diagnosis not present

## 2019-10-23 DIAGNOSIS — E781 Pure hyperglyceridemia: Secondary | ICD-10-CM | POA: Insufficient documentation

## 2019-10-23 DIAGNOSIS — M1990 Unspecified osteoarthritis, unspecified site: Secondary | ICD-10-CM | POA: Diagnosis not present

## 2019-10-23 DIAGNOSIS — E7800 Pure hypercholesterolemia, unspecified: Secondary | ICD-10-CM | POA: Diagnosis not present

## 2019-10-23 DIAGNOSIS — E119 Type 2 diabetes mellitus without complications: Secondary | ICD-10-CM | POA: Insufficient documentation

## 2019-10-23 DIAGNOSIS — Z6836 Body mass index (BMI) 36.0-36.9, adult: Secondary | ICD-10-CM | POA: Diagnosis not present

## 2019-10-23 DIAGNOSIS — Z8673 Personal history of transient ischemic attack (TIA), and cerebral infarction without residual deficits: Secondary | ICD-10-CM | POA: Insufficient documentation

## 2019-10-23 LAB — PROTIME-INR
INR: 1.8 — ABNORMAL HIGH (ref 0.8–1.2)
Prothrombin Time: 21 seconds — ABNORMAL HIGH (ref 11.4–15.2)

## 2019-10-23 SURGERY — KYPHOPLASTY
Anesthesia: General | Site: Back

## 2019-10-23 MED ORDER — LACTATED RINGERS IV SOLN: INTRAVENOUS | Status: DC

## 2019-10-23 MED ORDER — DEXTROSE 5 % IV SOLN
3.0000 g | INTRAVENOUS | Status: DC
  Filled 2019-10-23 (×2): qty 3000

## 2019-10-23 SURGICAL SUPPLY — 18 items
COVER WAND RF STERILE (DRAPES) ×3 IMPLANT
DERMABOND ADVANCED (GAUZE/BANDAGES/DRESSINGS) ×2
DERMABOND ADVANCED .7 DNX12 (GAUZE/BANDAGES/DRESSINGS) ×1 IMPLANT
DEVICE BIOPSY BONE KYPHX (INSTRUMENTS) ×3 IMPLANT
DRAPE C-ARM XRAY 36X54 (DRAPES) ×3 IMPLANT
DURAPREP 26ML APPLICATOR (WOUND CARE) ×3 IMPLANT
GLOVE SURG SYN 9.0  PF PI (GLOVE) ×2
GLOVE SURG SYN 9.0 PF PI (GLOVE) ×1 IMPLANT
GOWN SRG 2XL LVL 4 RGLN SLV (GOWNS) ×1 IMPLANT
GOWN STRL NON-REIN 2XL LVL4 (GOWNS) ×2
GOWN STRL REUS W/ TWL LRG LVL3 (GOWN DISPOSABLE) ×1 IMPLANT
GOWN STRL REUS W/TWL LRG LVL3 (GOWN DISPOSABLE) ×2
PACK KYPHOPLASTY (MISCELLANEOUS) ×3 IMPLANT
RENTAL RFA  GENERATOR (MISCELLANEOUS)
RENTAL RFA GENERATOR (MISCELLANEOUS) IMPLANT
STRAP SAFETY 5IN WIDE (MISCELLANEOUS) ×3 IMPLANT
TRAY KYPHOPAK 15/3 EXPRESS 1ST (MISCELLANEOUS) ×3 IMPLANT
TRAY KYPHOPAK 20/3 EXPRESS 1ST (MISCELLANEOUS) ×3 IMPLANT

## 2019-10-23 NOTE — H&P (Signed)
Chief Complaint  Patient presents with  . Establish Care  L2 Compression fx   History of the Present Illness: [name] Hardin is a 86 y.o. male here for evaluation of a L2 compression fracture as visualized on MRI from [facility] which was reviewed by me prior to his visit today. He presents for evaluation and discussion of possible kyphoplasty.   Today, [name] reports that his pain began on 06/2019 after ascending and descending a flight of steps at a courthouse in [place], [place]. He did not feel any symptoms immediately after leaving the courthouse, however that night his pain began to intensify to the point where he could hardly move. His pain has stayed the same since its onset.   I have reviewed past medical, surgical, social and family history, and allergies as documented in the EMR.  Past Medical History: Past Medical History:  Diagnosis Date  . Atrial tachycardia (CMS-HCC)  . Cataracts, bilateral  . Cerebrovascular disease  S/P stroke in 1997, fully recovered  . COPD (chronic obstructive pulmonary disease) (CMS-HCC)  . Diabetes mellitus type 2, uncomplicated (CMS-HCC)  . Gout  . Gout, joint  . Hyperglycemia  . Hyperlipidemia  hypercholesterolemia and hypertriglyceridemia  . Hypertension  . Intermittent atrial flutter (CMS-HCC)  versus atrial tachycardia  . Kidney stone  . Obesity  . Osteoarthritis  . Pulmonary emboli (CMS-HCC)  on chronic anticoagulation  . Sleep apnea  on CPAP  . Stroke (CMS-HCC)   Past Surgical History: Past Surgical History:  Procedure Laterality Date  . CATARACT EXTRACTION Bilateral  . COLONOSCOPY 05/16/2001  Adenomatous Polyps  . COLONOSCOPY 06/28/2006  Adenomatous Polyps  . COLONOSCOPY 10/15/2008  . COLONOSCOPY 07/27/2012  Adenomatous Polyps: CBF 07/2014; recall ltr mailed 05/31/2014 (dw)  . JOINT REPLACEMENT Right 11/15/2011  right total knee arthroplasty using computer-assisted navigation  . KNEE ARTHROSCOPY Left 1980s  .  TONSILLECTOMY   Past Family History: Family History  Problem Relation Age of Onset  . Myocardial Infarction (Heart attack) Father  . Polycythemia Brother   Medications: Current Outpatient Medications Ordered in Epic  Medication Sig Dispense Refill  . allopurinoL (ZYLOPRIM) 300 MG tablet TAKE 1 TABLET ONCE DAILY 90 tablet 3  . aspirin 81 MG EC tablet Take 81 mg by mouth once daily.  . colchicine (COLCRYS) 0.6 mg tablet TAKE 2 TABLETS (1.2 MG) AT FIRST SIGN OF GOUT FLARE FOLLOWED BY 1 TABLET AFTER 1 HOUR ( MAX 1.8 MG WITHIN 1 HOUR) 90 tablet 3  . diltiazem (CARDIZEM CD) 240 MG CD capsule Take 1 capsule (240 mg total) by mouth once daily 90 capsule 1  . fluticasone propion-salmeteroL (ADVAIR DISKUS) 250-50 mcg/dose diskus inhaler Inhale 1 inhalation into the lungs every 12 (twelve) hours 180 each 2  . FUROsemide (LASIX) 20 MG tablet TAKE 1 TABLET ONCE DAILY 90 tablet 3  . HYDROcodone-acetaminophen (NORCO) 5-325 mg tablet Take 1 tablet by mouth 2 (two) times daily as needed 14 tablet 0  . multivitamin (MULTI-DAY) tablet Take 1 tablet by mouth once daily.  . potassium chloride (KLOR-CON) 10 MEQ ER tablet TAKE 1 TABLET DAILY 90 tablet 3  . simvastatin (ZOCOR) 40 MG tablet Take 1 tablet (40 mg total) by mouth nightly 90 tablet 3  . SPIRIVA WITH HANDIHALER 18 mcg inhalation capsule INHALE THE CONTENTS OF 1 CAPSULE ONCE DAILY AS DIRECTED PER PACKAGE INSTRUCTIONS 90 capsule 3  . traMADoL (ULTRAM) 50 mg tablet Take 1 tablet (50 mg total) by mouth 2 (two) times daily as needed 1-2  tablets po bid prn 21 tablet 0  . warfarin (COUMADIN) 1 MG tablet Take 1/2 tablet with 5 mg daily for 2 weeks as directed (total of 5.5 mg daily) 30 tablet 11  . warfarin (COUMADIN) 5 MG tablet TAKE 1 TABLET AS DIRECTED 90 tablet 3   Current Facility-Administered Medications Ordered in Epic  Medication Dose Route Frequency Provider Last Rate Last Admin  . cyanocobalamin (VITAMIN B12) injection 1,000 mcg 1,000 mcg  Intramuscular Q30 Days Caleen Essex III, MD 1,000 mcg at 09/25/19 0932   Allergies: No Known Allergies   Body mass index is 36.82 kg/m.  Review of Systems: A comprehensive 14 point ROS was performed, reviewed, and the pertinent orthopaedic findings are documented in the HPI.  Vitals:  10/19/19 1010  BP: 128/74   General Physical Examination:  General/Constitutional: No apparent distress: well-nourished and well developed. Eyes: Pupils equal, round with synchronous movement. Lungs: Clear to auscultation HEENT: Normal Vascular: No edema, swelling or tenderness, except as noted in detailed exam. Cardiac: Heart rate and rhythm is regular. Integumentary: No impressive skin lesions present, except as noted in detailed exam. Neuro/Psych: Normal mood and affect, oriented to person, place and time.  Musculoskeletal Examination: On exam, there is tenderness to palpation over L2 at the location of his compression fracture. Sensation is decreased in the bilateral lower extremities with some peripheral edema appreciated.   Radiographs: No new imaging studies were obtained or reviewed today.  Assessment: ICD-10-CM  1. Closed wedge compression fracture of L2 vertebra, initial encounter (CMS-HCC) S32.020A   Plan: Given the patient's report, physical examination findings, and MR images of the lumbar spine, I have recommended we proceed with kyphoplasty to treat his L2 compression fracture. I discussed the risks, benefits, expected operative course, and expected postoperative course of this procedure with [name]. After our discussion, the patient has elected to proceed with operative intervention. We will work to get this scheduled.   Attestation: Mickle Plumb, am acting as documentation specialist for Lauris Poag, MD    Electronically signed by Lauris Poag, MD at 10/21/2019 6:56 PM EDT   Reviewed paper H+P, will be scanned into chart. No changes  noted.

## 2019-10-23 NOTE — Progress Notes (Signed)
Procedure cancelled due to abnormal lab values. PT 21.0  INR 1.8 . Dr Rudene Christians cancelled the procedure after notification of lab values.

## 2019-10-23 NOTE — Anesthesia Preprocedure Evaluation (Deleted)
Anesthesia Evaluation  Patient identified by MRN, date of birth, ID band Patient awake    Reviewed: Allergy & Precautions, NPO status , Patient's Chart, lab work & pertinent test results  History of Anesthesia Complications Negative for: history of anesthetic complications  Airway Mallampati: II       Dental  (+) Dental Advidsory Given, Caps, Teeth Intact   Pulmonary shortness of breath and with exertion, sleep apnea and Continuous Positive Airway Pressure Ventilation , COPD,  COPD inhaler, neg recent URI, former smoker,           Cardiovascular hypertension, Pt. on medications (-) angina(-) Past MI and (-) CHF (-) dysrhythmias (-) Valvular Problems/Murmurs     Neuro/Psych neg Seizures CVA (lip droop only), No Residual Symptoms    GI/Hepatic Neg liver ROS, neg GERD  ,  Endo/Other  diabetes (borderline)  Renal/GU Renal InsufficiencyRenal disease     Musculoskeletal   Abdominal   Peds  Hematology  (+) Blood dyscrasia, anemia ,   Anesthesia Other Findings Past Medical History: 03/05/2010: ATRIAL FIBRILLATION     Comment:  Qualifier: Diagnosis of  By: Lovena Le, MD, Martyn Malay  12/16/2013: Benign hypertension No date: Cancer ([place])     Comment:  bladder 12/16/2013: Chronic obstructive pulmonary disease ([place]) No date: COPD (chronic obstructive pulmonary disease) ([place]) No date: Family history of adverse reaction to anesthesia     Comment:  Brother has a difficult time waking up. 08/2000: Hx pulmonary embolism No date: Hypercholesterolemia No date: Hyperglycemia No date: Hypertension No date: Osteoarthritis No date: Pre-diabetes 03/05/2010: PREMATURE VENTRICULAR CONTRACTIONS     Comment:  Qualifier: Diagnosis of  By: Lovena Le, MD, Martyn Malay  12/16/2013: Pulmonary embolism ([place]) 1997: Stroke ([place])   Reproductive/Obstetrics                              Anesthesia Physical  Anesthesia Plan  ASA: III  Anesthesia Plan: General   Post-op Pain Management:    Induction: Intravenous  PONV Risk Score and Plan: 2 and Propofol infusion and TIVA  Airway Management Planned: Nasal Cannula and Natural Airway  Additional Equipment:   Intra-op Plan:   Post-operative Plan:   Informed Consent: I have reviewed the patients History and Physical, chart, labs and discussed the procedure including the risks, benefits and alternatives for the proposed anesthesia with the patient or authorized representative who has indicated his/her understanding and acceptance.       Plan Discussed with:   Anesthesia Plan Comments:         Anesthesia Quick Evaluation

## 2019-10-24 MED ORDER — DEXTROSE 5 % IV SOLN
3.0000 g | INTRAVENOUS | Status: AC
  Administered 2019-10-25: 2 g via INTRAVENOUS
  Filled 2019-10-24: qty 3

## 2019-10-25 ENCOUNTER — Other Ambulatory Visit: Payer: Self-pay

## 2019-10-25 ENCOUNTER — Ambulatory Visit: Payer: Medicare Other | Admitting: Anesthesiology

## 2019-10-25 ENCOUNTER — Encounter
Admission: RE | Disposition: A | Discharge status: Home / Self Care | Payer: Self-pay | Source: Home / Self Care | Attending: Orthopedic Surgery

## 2019-10-25 ENCOUNTER — Encounter: Payer: Self-pay | Admitting: Orthopedic Surgery

## 2019-10-25 ENCOUNTER — Ambulatory Visit
Admission: RE | Admit: 2019-10-25 | Discharge: 2019-10-25 | Disposition: A | Discharge status: Home / Self Care | Payer: Medicare Other | Attending: Orthopedic Surgery | Admitting: Orthopedic Surgery

## 2019-10-25 ENCOUNTER — Ambulatory Visit: Payer: Medicare Other

## 2019-10-25 DIAGNOSIS — G4730 Sleep apnea, unspecified: Secondary | ICD-10-CM | POA: Diagnosis not present

## 2019-10-25 DIAGNOSIS — Z9841 Cataract extraction status, right eye: Secondary | ICD-10-CM | POA: Diagnosis not present

## 2019-10-25 DIAGNOSIS — M109 Gout, unspecified: Secondary | ICD-10-CM | POA: Insufficient documentation

## 2019-10-25 DIAGNOSIS — E669 Obesity, unspecified: Secondary | ICD-10-CM | POA: Insufficient documentation

## 2019-10-25 DIAGNOSIS — I10 Essential (primary) hypertension: Secondary | ICD-10-CM | POA: Diagnosis not present

## 2019-10-25 DIAGNOSIS — Z7951 Long term (current) use of inhaled steroids: Secondary | ICD-10-CM | POA: Insufficient documentation

## 2019-10-25 DIAGNOSIS — Z87891 Personal history of nicotine dependence: Secondary | ICD-10-CM | POA: Diagnosis not present

## 2019-10-25 DIAGNOSIS — Z96653 Presence of artificial knee joint, bilateral: Secondary | ICD-10-CM | POA: Diagnosis not present

## 2019-10-25 DIAGNOSIS — Z79899 Other long term (current) drug therapy: Secondary | ICD-10-CM | POA: Insufficient documentation

## 2019-10-25 DIAGNOSIS — E781 Pure hyperglyceridemia: Secondary | ICD-10-CM | POA: Diagnosis not present

## 2019-10-25 DIAGNOSIS — M1990 Unspecified osteoarthritis, unspecified site: Secondary | ICD-10-CM | POA: Diagnosis not present

## 2019-10-25 DIAGNOSIS — Z7982 Long term (current) use of aspirin: Secondary | ICD-10-CM | POA: Insufficient documentation

## 2019-10-25 DIAGNOSIS — J449 Chronic obstructive pulmonary disease, unspecified: Secondary | ICD-10-CM | POA: Insufficient documentation

## 2019-10-25 DIAGNOSIS — S32020A Wedge compression fracture of second lumbar vertebra, initial encounter for closed fracture: Secondary | ICD-10-CM | POA: Diagnosis not present

## 2019-10-25 DIAGNOSIS — Z86711 Personal history of pulmonary embolism: Secondary | ICD-10-CM | POA: Diagnosis not present

## 2019-10-25 DIAGNOSIS — Z9842 Cataract extraction status, left eye: Secondary | ICD-10-CM | POA: Diagnosis not present

## 2019-10-25 DIAGNOSIS — Y9301 Activity, walking, marching and hiking: Secondary | ICD-10-CM | POA: Diagnosis not present

## 2019-10-25 DIAGNOSIS — Z6836 Body mass index (BMI) 36.0-36.9, adult: Secondary | ICD-10-CM | POA: Insufficient documentation

## 2019-10-25 DIAGNOSIS — Z8249 Family history of ischemic heart disease and other diseases of the circulatory system: Secondary | ICD-10-CM | POA: Diagnosis not present

## 2019-10-25 DIAGNOSIS — E7800 Pure hypercholesterolemia, unspecified: Secondary | ICD-10-CM | POA: Diagnosis not present

## 2019-10-25 DIAGNOSIS — E119 Type 2 diabetes mellitus without complications: Secondary | ICD-10-CM | POA: Diagnosis not present

## 2019-10-25 DIAGNOSIS — Z7901 Long term (current) use of anticoagulants: Secondary | ICD-10-CM | POA: Diagnosis not present

## 2019-10-25 DIAGNOSIS — Z8673 Personal history of transient ischemic attack (TIA), and cerebral infarction without residual deficits: Secondary | ICD-10-CM | POA: Diagnosis not present

## 2019-10-25 DIAGNOSIS — E785 Hyperlipidemia, unspecified: Secondary | ICD-10-CM | POA: Insufficient documentation

## 2019-10-25 DIAGNOSIS — I4891 Unspecified atrial fibrillation: Secondary | ICD-10-CM | POA: Diagnosis not present

## 2019-10-25 DIAGNOSIS — Z419 Encounter for procedure for purposes other than remedying health state, unspecified: Secondary | ICD-10-CM

## 2019-10-25 HISTORY — PX: KYPHOPLASTY: SHX5884

## 2019-10-25 LAB — PROTIME-INR
INR: 1.4 — ABNORMAL HIGH (ref 0.8–1.2)
Prothrombin Time: 17.1 seconds — ABNORMAL HIGH (ref 11.4–15.2)

## 2019-10-25 SURGERY — KYPHOPLASTY
Anesthesia: General | Site: Back

## 2019-10-25 MED ORDER — BUPIVACAINE-EPINEPHRINE (PF) 0.5% -1:200000 IJ SOLN
INTRAMUSCULAR | Status: AC
  Filled 2019-10-25: qty 90

## 2019-10-25 MED ORDER — LIDOCAINE HCL (PF) 1 % IJ SOLN
INTRAMUSCULAR | Status: AC
  Filled 2019-10-25: qty 30

## 2019-10-25 MED ORDER — PROPOFOL 500 MG/50ML IV EMUL
INTRAVENOUS | Status: AC
  Filled 2019-10-25: qty 50

## 2019-10-25 MED ORDER — PROPOFOL 500 MG/50ML IV EMUL
INTRAVENOUS | Status: DC | PRN
  Administered 2019-10-25: 30 ug/kg/min via INTRAVENOUS

## 2019-10-25 MED ORDER — LACTATED RINGERS IV SOLN: INTRAVENOUS | Status: DC

## 2019-10-25 MED ORDER — FENTANYL CITRATE (PF) 100 MCG/2ML IJ SOLN: 25.0000 ug | INTRAMUSCULAR | Status: DC | PRN

## 2019-10-25 MED ORDER — HYDROCODONE-ACETAMINOPHEN 7.5-325 MG PO TABS
1.0000 | ORAL_TABLET | Freq: Once | ORAL | Status: DC | PRN
  Filled 2019-10-25: qty 1

## 2019-10-25 MED ORDER — FENTANYL CITRATE (PF) 100 MCG/2ML IJ SOLN
INTRAMUSCULAR | Status: DC | PRN
  Administered 2019-10-25: 12.5 ug via INTRAVENOUS
  Administered 2019-10-25: 25 ug via INTRAVENOUS
  Administered 2019-10-25: 12.5 ug via INTRAVENOUS

## 2019-10-25 MED ORDER — ACETAMINOPHEN 160 MG/5ML PO SOLN
325.0000 mg | ORAL | Status: DC | PRN
  Filled 2019-10-25: qty 20.3

## 2019-10-25 MED ORDER — ONDANSETRON HCL 4 MG/2ML IJ SOLN: 4.0000 mg | Freq: Once | INTRAMUSCULAR | Status: DC | PRN

## 2019-10-25 MED ORDER — LIDOCAINE HCL 1 % IJ SOLN
INTRAMUSCULAR | Status: DC | PRN
  Administered 2019-10-25: 30 mL

## 2019-10-25 MED ORDER — PROPOFOL 10 MG/ML IV BOLUS
INTRAVENOUS | Status: DC | PRN
  Administered 2019-10-25: 30 mg via INTRAVENOUS
  Administered 2019-10-25: 20 mg via INTRAVENOUS
  Administered 2019-10-25: 10 mg via INTRAVENOUS

## 2019-10-25 MED ORDER — FENTANYL CITRATE (PF) 100 MCG/2ML IJ SOLN
INTRAMUSCULAR | Status: AC
  Filled 2019-10-25: qty 2

## 2019-10-25 MED ORDER — IOHEXOL 180 MG/ML  SOLN
INTRAMUSCULAR | Status: DC | PRN
  Administered 2019-10-25: 11:00:00 20 mL

## 2019-10-25 MED ORDER — BUPIVACAINE-EPINEPHRINE (PF) 0.5% -1:200000 IJ SOLN
INTRAMUSCULAR | Status: DC | PRN
  Administered 2019-10-25: 20 mL via PERINEURAL

## 2019-10-25 MED ORDER — KETAMINE HCL 50 MG/ML IJ SOLN
INTRAMUSCULAR | Status: AC
  Filled 2019-10-25: qty 10

## 2019-10-25 MED ORDER — ACETAMINOPHEN 325 MG PO TABS: 325.0000 mg | ORAL_TABLET | ORAL | Status: DC | PRN

## 2019-10-25 MED ORDER — HYDROCODONE-ACETAMINOPHEN 5-325 MG PO TABS: 1.0000 | ORAL_TABLET | Freq: Four times a day (QID) | ORAL | 0 refills | Status: DC | PRN

## 2019-10-25 SURGICAL SUPPLY — 21 items
CEMENT KYPHON CX01A KIT/MIXER (Cement) ×3 IMPLANT
COVER WAND RF STERILE (DRAPES) ×3 IMPLANT
DERMABOND ADVANCED (GAUZE/BANDAGES/DRESSINGS) ×2
DERMABOND ADVANCED .7 DNX12 (GAUZE/BANDAGES/DRESSINGS) ×1 IMPLANT
DEVICE BIOPSY BONE KYPHX (INSTRUMENTS) ×3 IMPLANT
DRAPE C-ARM XRAY 36X54 (DRAPES) ×3 IMPLANT
DURAPREP 26ML APPLICATOR (WOUND CARE) ×3 IMPLANT
FEE RENTAL RFA GENERATOR (MISCELLANEOUS) IMPLANT
GLOVE SURG SYN 9.0  PF PI (GLOVE) ×2
GLOVE SURG SYN 9.0 PF PI (GLOVE) ×1 IMPLANT
GOWN SRG 2XL LVL 4 RGLN SLV (GOWNS) ×1 IMPLANT
GOWN STRL NON-REIN 2XL LVL4 (GOWNS) ×2
GOWN STRL REUS W/ TWL LRG LVL3 (GOWN DISPOSABLE) ×1 IMPLANT
GOWN STRL REUS W/TWL LRG LVL3 (GOWN DISPOSABLE) ×2
PACK KYPHOPLASTY (MISCELLANEOUS) ×3 IMPLANT
RENTAL RFA  GENERATOR (MISCELLANEOUS)
RENTAL RFA GENERATOR (MISCELLANEOUS) IMPLANT
STRAP SAFETY 5IN WIDE (MISCELLANEOUS) ×3 IMPLANT
SWABSTK COMLB BENZOIN TINCTURE (MISCELLANEOUS) ×3 IMPLANT
TRAY KYPHOPAK 15/3 EXPRESS 1ST (MISCELLANEOUS) ×1 IMPLANT
TRAY KYPHOPAK 20/3 EXPRESS 1ST (MISCELLANEOUS) ×3 IMPLANT

## 2019-10-25 NOTE — Anesthesia Preprocedure Evaluation (Signed)
Anesthesia Evaluation  Patient identified by MRN, date of birth, ID band Patient awake    Reviewed: Allergy & Precautions, H&P , NPO status , reviewed documented beta blocker date and time   History of Anesthesia Complications (+) Family history of anesthesia reaction  Airway Mallampati: II  TM Distance: >3 FB Neck ROM: limited    Dental  (+) Chipped, Caps, Dental Advidsory Given   Pulmonary shortness of breath, sleep apnea and Continuous Positive Airway Pressure Ventilation , COPD, former smoker,    Pulmonary exam normal        Cardiovascular hypertension, + dysrhythmias Atrial Fibrillation  Rhythm:irregular     Neuro/Psych CVA    GI/Hepatic neg GERD  ,  Endo/Other  diabetes  Renal/GU Renal disease     Musculoskeletal  (+) Arthritis ,   Abdominal   Peds  Hematology  (+) Blood dyscrasia, anemia ,   Anesthesia Other Findings Past Medical History: 03/05/2010: ATRIAL FIBRILLATION     Comment:  Qualifier: Diagnosis of  By: Lovena Le, MD, Martyn Malay  12/16/2013: Benign hypertension No date: Cancer ([place])     Comment:  bladder 12/16/2013: Chronic obstructive pulmonary disease (HCC) No date: COPD (chronic obstructive pulmonary disease) ([place]) No date: Family history of adverse reaction to anesthesia     Comment:  Brother has a difficult time waking up. 08/2000: Hx pulmonary embolism No date: Hypercholesterolemia No date: Hyperglycemia No date: Hypertension No date: Osteoarthritis No date: Pre-diabetes 03/05/2010: PREMATURE VENTRICULAR CONTRACTIONS     Comment:  Qualifier: Diagnosis of  By: Lovena Le, MD, Martyn Malay  12/16/2013: Pulmonary embolism ([place]) 1997: Stroke [facility])  Past Surgical History: 05/18/2017: Consuela Mimes W/ RETROGRADES; Bilateral     Comment:  Procedure: CYSTOSCOPY WITH RETROGRADE PYELOGRAM;                Surgeon: Hollice Espy, MD;  Location: [facility] ORS;          Service: Urology;  Laterality: Bilateral; 05/18/2017: CYSTOSCOPY WITH BIOPSY; N/A     Comment:  Procedure: CYSTOSCOPY WITH BIOPSY;  Surgeon: Hollice Espy, MD;  Location: [facility] ORS;  Service: Urology;                Laterality: N/A; No date: JOINT REPLACEMENT; Right 02/04/2015: PERIPHERAL VASCULAR CATHETERIZATION; N/A     Comment:  Procedure: IVC Filter Removal;  Surgeon: Katha Cabal, MD;  Location: [place] [facility];  Service:               Cardiovascular;  Laterality: N/A; No date: TONSILLECTOMY 05/18/2017: URETEROSCOPY; Left     Comment:  Procedure: URETEROSCOPY;  Surgeon: Hollice Espy, MD;               Location: [facility] ORS;  Service: Urology;  Laterality: Left;  BMI    Body Mass Index: 36.26 kg/m      Reproductive/Obstetrics                             Anesthesia Physical Anesthesia Plan  ASA: IV  Anesthesia Plan: General   Post-op Pain Management:    Induction: Intravenous  PONV Risk Score and Plan: TIVA  and Treatment may vary due to age or medical condition  Airway Management Planned: Nasal Cannula and Natural Airway  Additional Equipment:   Intra-op Plan:   Post-operative Plan:   Informed Consent: I have reviewed the patients History and Physical, chart, labs and discussed the procedure including the risks, benefits and alternatives for the proposed anesthesia with the patient or authorized representative who has indicated his/her understanding and acceptance.     Dental Advisory Given  Plan Discussed with: CRNA  Anesthesia Plan Comments:         Anesthesia Quick Evaluation

## 2019-10-25 NOTE — Op Note (Signed)
Date October 25, 2019  time 10:53 AM   PATIENT:  [name] Hardin   PRE-OPERATIVE DIAGNOSIS:  closed wedge compression fracture of L2   POST-OPERATIVE DIAGNOSIS:  closed wedge compression fracture of L2   PROCEDURE:  Procedure(s): KYPHOPLASTY L2  SURGEON: Laurene Footman, MD   ASSISTANTS: None   ANESTHESIA:   local and MAC   EBL:  No intake/output data recorded.   BLOOD ADMINISTERED:none   DRAINS: none    LOCAL MEDICATIONS USED:  MARCAINE    and XYLOCAINE    SPECIMEN:   L2   DISPOSITION OF SPECIMEN:  Pathology   COUNTS:  YES   TOURNIQUET:  * No tourniquets in log *   IMPLANTS: Bone cement   DICTATION: .Dragon Dictation  patient was brought to the operating room and after adequate anesthesia was obtained the patient was placed prone.  C arm was brought in in good visualization of the affected level obtained on both AP and lateral projections.  After patient identification and timeout procedures were completed, local anesthetic was infiltrated with 10 cc 1% Xylocaine infiltrated subcutaneously.  This is done the area on the each side of the planned approach.  The back was then prepped and draped in the usual sterile manner and repeat timeout procedure carried out.  A spinal needle was brought down to the pedicle on the each side of  L2 and a 50-50 mix of 1% Xylocaine half percent Sensorcaine with epinephrine total of 20 cc injected.  After allowing this to set a small incision was made and the trocar was advanced into the vertebral body in an extrapedicular fashion.  Biopsy was obtained Drilling was carried out balloon inserted with inflation to  3 cc on each side.  When the cement was appropriate consistency 8-1/2 cc were injected into the vertebral body without extravasation, good fill superior to inferior endplates and from right to left sides along the inferior endplate.  After the cement had set the trochar was removed and permanent C-arm views obtained.  The wound was closed with  Dermabond followed by Band-Aid   PLAN OF CARE: Discharge to home after PACU   PATIENT DISPOSITION:  PACU - hemodynamically stable.

## 2019-10-25 NOTE — Discharge Instructions (Addendum)
Try to take it easy today and tomorrow.  Remove Band-Aids on Saturday then okay to shower.  Try to avoid bending or lifting over 5 pounds for 2 weeks but try to get up and walk around as much as you can tolerate.  Call office if you are having increased pain.  AMBULATORY SURGERY  DISCHARGE INSTRUCTIONS   1) The drugs that you were given will stay in your system until tomorrow so for the next 24 hours you should not:  A) Drive an automobile B) Make any legal decisions C) Drink any alcoholic beverage   2) You may resume regular meals tomorrow.  Today it is better to start with liquids and gradually work up to solid foods.  You may eat anything you prefer, but it is better to start with liquids, then soup and crackers, and gradually work up to solid foods.   3) Please notify your doctor immediately if you have any unusual bleeding, trouble breathing, redness and pain at the surgery site, drainage, fever, or pain not relieved by medication.    4) Additional Instructions:        Please contact your physician with any problems or Same Day Surgery at [phone], Monday through Friday 6 am to 4 pm, or  at [facility] number at [phone].

## 2019-10-25 NOTE — H&P (Signed)
Chief Complaint  Patient presents with  . Establish Care  L2 Compression fx   History of the Present Illness: [name] Hardin is a 86 y.o. male here for evaluation of a L2 compression fracture as visualized on MRI from [facility] which was reviewed by me prior to his visit today. He presents for evaluation and discussion of possible kyphoplasty.   Today, Ms. Weich reports that his pain began on 06/2019 after ascending and descending a flight of steps at a courthouse in [place], [place]. He did not feel any symptoms immediately after leaving the courthouse, however that night his pain began to intensify to the point where he could hardly move. His pain has stayed the same since its onset.   I have reviewed past medical, surgical, social and family history, and allergies as documented in the EMR.  Past Medical History: Past Medical History:  Diagnosis Date  . Atrial tachycardia (CMS-HCC)  . Cataracts, bilateral  . Cerebrovascular disease  S/P stroke in 1997, fully recovered  . COPD (chronic obstructive pulmonary disease) (CMS-HCC)  . Diabetes mellitus type 2, uncomplicated (CMS-HCC)  . Gout  . Gout, joint  . Hyperglycemia  . Hyperlipidemia  hypercholesterolemia and hypertriglyceridemia  . Hypertension  . Intermittent atrial flutter (CMS-HCC)  versus atrial tachycardia  . Kidney stone  . Obesity  . Osteoarthritis  . Pulmonary emboli (CMS-HCC)  on chronic anticoagulation  . Sleep apnea  on CPAP  . Stroke (CMS-HCC)   Past Surgical History: Past Surgical History:  Procedure Laterality Date  . CATARACT EXTRACTION Bilateral  . COLONOSCOPY 05/16/2001  Adenomatous Polyps  . COLONOSCOPY 06/28/2006  Adenomatous Polyps  . COLONOSCOPY 10/15/2008  . COLONOSCOPY 07/27/2012  Adenomatous Polyps: CBF 07/2014; recall ltr mailed 05/31/2014 (dw)  . JOINT REPLACEMENT Right 11/15/2011  right total knee arthroplasty using computer-assisted navigation  . KNEE ARTHROSCOPY Left 1980s  .  TONSILLECTOMY   Past Family History: Family History  Problem Relation Age of Onset  . Myocardial Infarction (Heart attack) Father  . Polycythemia Brother   Medications: Current Outpatient Medications Ordered in Epic  Medication Sig Dispense Refill  . allopurinoL (ZYLOPRIM) 300 MG tablet TAKE 1 TABLET ONCE DAILY 90 tablet 3  . aspirin 81 MG EC tablet Take 81 mg by mouth once daily.  . colchicine (COLCRYS) 0.6 mg tablet TAKE 2 TABLETS (1.2 MG) AT FIRST SIGN OF GOUT FLARE FOLLOWED BY 1 TABLET AFTER 1 HOUR ( MAX 1.8 MG WITHIN 1 HOUR) 90 tablet 3  . diltiazem (CARDIZEM CD) 240 MG CD capsule Take 1 capsule (240 mg total) by mouth once daily 90 capsule 1  . fluticasone propion-salmeteroL (ADVAIR DISKUS) 250-50 mcg/dose diskus inhaler Inhale 1 inhalation into the lungs every 12 (twelve) hours 180 each 2  . FUROsemide (LASIX) 20 MG tablet TAKE 1 TABLET ONCE DAILY 90 tablet 3  . HYDROcodone-acetaminophen (NORCO) 5-325 mg tablet Take 1 tablet by mouth 2 (two) times daily as needed 14 tablet 0  . multivitamin (MULTI-DAY) tablet Take 1 tablet by mouth once daily.  . potassium chloride (KLOR-CON) 10 MEQ ER tablet TAKE 1 TABLET DAILY 90 tablet 3  . simvastatin (ZOCOR) 40 MG tablet Take 1 tablet (40 mg total) by mouth nightly 90 tablet 3  . SPIRIVA WITH HANDIHALER 18 mcg inhalation capsule INHALE THE CONTENTS OF 1 CAPSULE ONCE DAILY AS DIRECTED PER PACKAGE INSTRUCTIONS 90 capsule 3  . traMADoL (ULTRAM) 50 mg tablet Take 1 tablet (50 mg total) by mouth 2 (two) times daily as needed 1-2  tablets po bid prn 21 tablet 0  . warfarin (COUMADIN) 1 MG tablet Take 1/2 tablet with 5 mg daily for 2 weeks as directed (total of 5.5 mg daily) 30 tablet 11  . warfarin (COUMADIN) 5 MG tablet TAKE 1 TABLET AS DIRECTED 90 tablet 3   Current Facility-Administered Medications Ordered in Epic  Medication Dose Route Frequency Provider Last Rate Last Admin  . cyanocobalamin (VITAMIN B12) injection 1,000 mcg 1,000 mcg  Intramuscular Q30 Days Caleen Essex III, MD 1,000 mcg at 09/25/19 0932   Allergies: No Known Allergies   Body mass index is 36.82 kg/m.  Review of Systems: A comprehensive 14 point ROS was performed, reviewed, and the pertinent orthopaedic findings are documented in the HPI.  Vitals:  10/19/19 1010  BP: 128/74   General Physical Examination:  General/Constitutional: No apparent distress: well-nourished and well developed. Eyes: Pupils equal, round with synchronous movement. Lungs: Clear to auscultation HEENT: Normal Vascular: No edema, swelling or tenderness, except as noted in detailed exam. Cardiac: Heart rate and rhythm is regular. Integumentary: No impressive skin lesions present, except as noted in detailed exam. Neuro/Psych: Normal mood and affect, oriented to person, place and time.  Musculoskeletal Examination: On exam, there is tenderness to palpation over L2 at the location of his compression fracture. Sensation is decreased in the bilateral lower extremities with some peripheral edema appreciated.   Radiographs: No new imaging studies were obtained or reviewed today.  Assessment: ICD-10-CM  1. Closed wedge compression fracture of L2 vertebra, initial encounter (CMS-HCC) S32.020A   Plan: Given the patient's report, physical examination findings, and MR images of the lumbar spine, I have recommended we proceed with kyphoplasty to treat his L2 compression fracture. I discussed the risks, benefits, expected operative course, and expected postoperative course of this procedure with [name] Hardin. After our discussion, the patient has elected to proceed with operative intervention. We will work to get this scheduled.   Attestation: Mickle Plumb, am acting as documentation specialist for Lauris Poag, MD    Electronically signed by Lauris Poag, MD at 10/21/2019 6:56 PM EDT   Reviewed paper H+P, will be scanned into chart. No changes  noted.

## 2019-10-25 NOTE — Transfer of Care (Signed)
Immediate Anesthesia Transfer of Care Note  Patient: [name] Hardin  Procedure(s) Performed: L2 KYPHOPLASTY (N/A Back)  Patient Location: PACU  Anesthesia Type:General  Level of Consciousness: awake, alert  and oriented  Airway & Oxygen Therapy: Patient Spontanous Breathing and Patient connected to nasal cannula oxygen  Post-op Assessment: Report given to RN and Post -op Vital signs reviewed and stable  Post vital signs: Reviewed and stable  Last Vitals:  Vitals Value Taken Time  BP 114/64 10/25/19 1046  Temp 36.4 C 10/25/19 1046  Pulse 101 10/25/19 1049  Resp 14 10/25/19 1048  SpO2 94 % 10/25/19 1049  Vitals shown include unvalidated device data.  Last Pain:  Vitals:   10/25/19 0828  TempSrc: Temporal  PainSc: 0-No pain         Complications: No apparent anesthesia complications

## 2019-10-26 LAB — SURGICAL PATHOLOGY

## 2019-10-26 NOTE — Anesthesia Postprocedure Evaluation (Signed)
Anesthesia Post Note  Patient: [name] Hardin  Procedure(s) Performed: L2 KYPHOPLASTY (N/A Back)  Patient location during evaluation: PACU Anesthesia Type: General Level of consciousness: awake and alert Pain management: pain level controlled Vital Signs Assessment: post-procedure vital signs reviewed and stable Respiratory status: spontaneous breathing, nonlabored ventilation and respiratory function stable Cardiovascular status: blood pressure returned to baseline and stable Postop Assessment: no apparent nausea or vomiting Anesthetic complications: no     Last Vitals:  Vitals:   10/25/19 1119 10/25/19 1131  BP:  114/60  Pulse:  81  Resp: 11 20  Temp: 36.4 C 36.7 C  SpO2: 95% 98%    Last Pain:  Vitals:   10/25/19 1131  TempSrc: Temporal  PainSc: 3                  Kilah Drahos Harvie Heck

## 2019-12-10 ENCOUNTER — Inpatient Hospital Stay
Admission: EM | Admit: 2019-12-10 | Discharge: 2019-12-13 | DRG: 199 | Payer: Medicare Other | Attending: Internal Medicine | Admitting: Internal Medicine

## 2019-12-10 ENCOUNTER — Other Ambulatory Visit: Payer: Self-pay

## 2019-12-10 ENCOUNTER — Emergency Department: Payer: Medicare Other

## 2019-12-10 ENCOUNTER — Inpatient Hospital Stay (HOSPITAL_COMMUNITY)
Admit: 2019-12-10 | Discharge: 2019-12-10 | Disposition: A | Discharge status: Home / Self Care | Payer: Medicare Other | Attending: Internal Medicine | Admitting: Internal Medicine

## 2019-12-10 DIAGNOSIS — I4892 Unspecified atrial flutter: Secondary | ICD-10-CM | POA: Diagnosis not present

## 2019-12-10 DIAGNOSIS — Y838 Other surgical procedures as the cause of abnormal reaction of the patient, or of later complication, without mention of misadventure at the time of the procedure: Secondary | ICD-10-CM | POA: Diagnosis present

## 2019-12-10 DIAGNOSIS — E876 Hypokalemia: Secondary | ICD-10-CM | POA: Diagnosis present

## 2019-12-10 DIAGNOSIS — J942 Hemothorax: Secondary | ICD-10-CM | POA: Diagnosis not present

## 2019-12-10 DIAGNOSIS — Z7901 Long term (current) use of anticoagulants: Secondary | ICD-10-CM

## 2019-12-10 DIAGNOSIS — I5043 Acute on chronic combined systolic (congestive) and diastolic (congestive) heart failure: Secondary | ICD-10-CM | POA: Diagnosis present

## 2019-12-10 DIAGNOSIS — R791 Abnormal coagulation profile: Secondary | ICD-10-CM | POA: Diagnosis present

## 2019-12-10 DIAGNOSIS — I130 Hypertensive heart and chronic kidney disease with heart failure and stage 1 through stage 4 chronic kidney disease, or unspecified chronic kidney disease: Secondary | ICD-10-CM | POA: Diagnosis present

## 2019-12-10 DIAGNOSIS — I452 Bifascicular block: Secondary | ICD-10-CM | POA: Diagnosis not present

## 2019-12-10 DIAGNOSIS — N179 Acute kidney failure, unspecified: Secondary | ICD-10-CM | POA: Diagnosis not present

## 2019-12-10 DIAGNOSIS — Z86711 Personal history of pulmonary embolism: Secondary | ICD-10-CM

## 2019-12-10 DIAGNOSIS — Z20822 Contact with and (suspected) exposure to covid-19: Secondary | ICD-10-CM | POA: Diagnosis present

## 2019-12-10 DIAGNOSIS — Z515 Encounter for palliative care: Secondary | ICD-10-CM | POA: Diagnosis not present

## 2019-12-10 DIAGNOSIS — M109 Gout, unspecified: Secondary | ICD-10-CM | POA: Diagnosis present

## 2019-12-10 DIAGNOSIS — Z79899 Other long term (current) drug therapy: Secondary | ICD-10-CM

## 2019-12-10 DIAGNOSIS — E1165 Type 2 diabetes mellitus with hyperglycemia: Secondary | ICD-10-CM | POA: Diagnosis present

## 2019-12-10 DIAGNOSIS — I482 Chronic atrial fibrillation, unspecified: Secondary | ICD-10-CM | POA: Diagnosis present

## 2019-12-10 DIAGNOSIS — E119 Type 2 diabetes mellitus without complications: Secondary | ICD-10-CM

## 2019-12-10 DIAGNOSIS — Z6837 Body mass index (BMI) 37.0-37.9, adult: Secondary | ICD-10-CM | POA: Diagnosis not present

## 2019-12-10 DIAGNOSIS — T859XXA Unspecified complication of internal prosthetic device, implant and graft, initial encounter: Secondary | ICD-10-CM

## 2019-12-10 DIAGNOSIS — I2782 Chronic pulmonary embolism: Secondary | ICD-10-CM | POA: Diagnosis not present

## 2019-12-10 DIAGNOSIS — Z8673 Personal history of transient ischemic attack (TIA), and cerebral infarction without residual deficits: Secondary | ICD-10-CM | POA: Diagnosis not present

## 2019-12-10 DIAGNOSIS — J930 Spontaneous tension pneumothorax: Secondary | ICD-10-CM | POA: Diagnosis present

## 2019-12-10 DIAGNOSIS — J939 Pneumothorax, unspecified: Secondary | ICD-10-CM | POA: Diagnosis present

## 2019-12-10 DIAGNOSIS — E1122 Type 2 diabetes mellitus with diabetic chronic kidney disease: Secondary | ICD-10-CM | POA: Diagnosis present

## 2019-12-10 DIAGNOSIS — R0602 Shortness of breath: Secondary | ICD-10-CM | POA: Diagnosis not present

## 2019-12-10 DIAGNOSIS — N1831 Chronic kidney disease, stage 3a: Secondary | ICD-10-CM | POA: Diagnosis present

## 2019-12-10 DIAGNOSIS — I509 Heart failure, unspecified: Secondary | ICD-10-CM | POA: Diagnosis not present

## 2019-12-10 DIAGNOSIS — J9311 Primary spontaneous pneumothorax: Secondary | ICD-10-CM | POA: Diagnosis not present

## 2019-12-10 DIAGNOSIS — J9601 Acute respiratory failure with hypoxia: Secondary | ICD-10-CM | POA: Diagnosis present

## 2019-12-10 DIAGNOSIS — E7800 Pure hypercholesterolemia, unspecified: Secondary | ICD-10-CM | POA: Diagnosis present

## 2019-12-10 DIAGNOSIS — Z66 Do not resuscitate: Secondary | ICD-10-CM | POA: Diagnosis present

## 2019-12-10 DIAGNOSIS — I493 Ventricular premature depolarization: Secondary | ICD-10-CM | POA: Diagnosis not present

## 2019-12-10 DIAGNOSIS — Y828 Other medical devices associated with adverse incidents: Secondary | ICD-10-CM | POA: Diagnosis not present

## 2019-12-10 DIAGNOSIS — I10 Essential (primary) hypertension: Secondary | ICD-10-CM | POA: Diagnosis present

## 2019-12-10 DIAGNOSIS — T502X5A Adverse effect of carbonic-anhydrase inhibitors, benzothiadiazides and other diuretics, initial encounter: Secondary | ICD-10-CM | POA: Diagnosis present

## 2019-12-10 DIAGNOSIS — Z7982 Long term (current) use of aspirin: Secondary | ICD-10-CM

## 2019-12-10 DIAGNOSIS — I5021 Acute systolic (congestive) heart failure: Secondary | ICD-10-CM | POA: Diagnosis not present

## 2019-12-10 DIAGNOSIS — H02843 Edema of right eye, unspecified eyelid: Secondary | ICD-10-CM | POA: Diagnosis not present

## 2019-12-10 DIAGNOSIS — Z87891 Personal history of nicotine dependence: Secondary | ICD-10-CM | POA: Diagnosis not present

## 2019-12-10 DIAGNOSIS — M1990 Unspecified osteoarthritis, unspecified site: Secondary | ICD-10-CM | POA: Diagnosis not present

## 2019-12-10 DIAGNOSIS — I5023 Acute on chronic systolic (congestive) heart failure: Secondary | ICD-10-CM | POA: Diagnosis not present

## 2019-12-10 DIAGNOSIS — T797XXA Traumatic subcutaneous emphysema, initial encounter: Secondary | ICD-10-CM | POA: Diagnosis present

## 2019-12-10 DIAGNOSIS — J449 Chronic obstructive pulmonary disease, unspecified: Secondary | ICD-10-CM | POA: Diagnosis present

## 2019-12-10 DIAGNOSIS — I4891 Unspecified atrial fibrillation: Secondary | ICD-10-CM

## 2019-12-10 DIAGNOSIS — E669 Obesity, unspecified: Secondary | ICD-10-CM | POA: Diagnosis not present

## 2019-12-10 DIAGNOSIS — J189 Pneumonia, unspecified organism: Secondary | ICD-10-CM | POA: Diagnosis not present

## 2019-12-10 DIAGNOSIS — I480 Paroxysmal atrial fibrillation: Secondary | ICD-10-CM | POA: Diagnosis not present

## 2019-12-10 LAB — COMPREHENSIVE METABOLIC PANEL
ALT: 18 U/L (ref 0–44)
AST: 29 U/L (ref 15–41)
Albumin: 3.8 g/dL (ref 3.5–5.0)
Alkaline Phosphatase: 101 U/L (ref 38–126)
Anion gap: 13 (ref 5–15)
BUN: 25 mg/dL — ABNORMAL HIGH (ref 8–23)
CO2: 25 mmol/L (ref 22–32)
Calcium: 8.9 mg/dL (ref 8.9–10.3)
Chloride: 102 mmol/L (ref 98–111)
Creatinine, Ser: 1.34 mg/dL — ABNORMAL HIGH (ref 0.61–1.24)
GFR calc Af Amer: 56 mL/min — ABNORMAL LOW (ref >60)
GFR calc non Af Amer: 48 mL/min — ABNORMAL LOW (ref >60)
Glucose, Bld: 148 mg/dL — ABNORMAL HIGH (ref 70–99)
Potassium: 2.9 mmol/L — ABNORMAL LOW (ref 3.5–5.1)
Sodium: 140 mmol/L (ref 135–145)
Total Bilirubin: 1.1 mg/dL (ref 0.3–1.2)
Total Protein: 6.7 g/dL (ref 6.5–8.1)

## 2019-12-10 LAB — GLUCOSE, CAPILLARY
Glucose-Capillary: 252 mg/dL — ABNORMAL HIGH (ref 70–99)
Glucose-Capillary: 157 mg/dL — ABNORMAL HIGH (ref 70–99)
Glucose-Capillary: 110 mg/dL — ABNORMAL HIGH (ref 70–99)

## 2019-12-10 LAB — HEMOGLOBIN A1C
Hgb A1c MFr Bld: 6 % — ABNORMAL HIGH (ref 4.8–5.6)
Mean Plasma Glucose: 125.5 mg/dL

## 2019-12-10 LAB — CBC WITH DIFFERENTIAL/PLATELET
Abs Immature Granulocytes: 0.02 10*3/uL (ref 0.00–0.07)
Basophils Absolute: 0 10*3/uL (ref 0.0–0.1)
Basophils Relative: 0 %
Eosinophils Absolute: 0.3 10*3/uL (ref 0.0–0.5)
Eosinophils Relative: 4 %
HCT: 37 % — ABNORMAL LOW (ref 39.0–52.0)
Hemoglobin: 11.9 g/dL — ABNORMAL LOW (ref 13.0–17.0)
Immature Granulocytes: 0 %
Lymphocytes Relative: 33 %
Lymphs Abs: 2.8 10*3/uL (ref 0.7–4.0)
MCH: 31.9 pg (ref 26.0–34.0)
MCHC: 32.2 g/dL (ref 30.0–36.0)
MCV: 99.2 fL (ref 80.0–100.0)
Monocytes Absolute: 0.6 10*3/uL (ref 0.1–1.0)
Monocytes Relative: 7 %
Neutro Abs: 4.7 10*3/uL (ref 1.7–7.7)
Neutrophils Relative %: 56 %
Platelets: 149 10*3/uL — ABNORMAL LOW (ref 150–400)
RBC: 3.73 MIL/uL — ABNORMAL LOW (ref 4.22–5.81)
RDW: 15.9 % — ABNORMAL HIGH (ref 11.5–15.5)
WBC: 8.4 10*3/uL (ref 4.0–10.5)
nRBC: 0 % (ref 0.0–0.2)

## 2019-12-10 LAB — PROTIME-INR
INR: 2.9 — ABNORMAL HIGH (ref 0.8–1.2)
Prothrombin Time: 29.5 seconds — ABNORMAL HIGH (ref 11.4–15.2)

## 2019-12-10 LAB — BLOOD GAS, VENOUS
Acid-Base Excess: 0.9 mmol/L (ref 0.0–2.0)
Bicarbonate: 27.1 mmol/L (ref 20.0–28.0)
O2 Saturation: 74.6 %
Patient temperature: 37
pCO2, Ven: 49 mmHg (ref 44.0–60.0)
pH, Ven: 7.35 (ref 7.250–7.430)
pO2, Ven: 42 mmHg (ref 32.0–45.0)

## 2019-12-10 LAB — TROPONIN I (HIGH SENSITIVITY)
Troponin I (High Sensitivity): 46 ng/L — ABNORMAL HIGH (ref <18)
Troponin I (High Sensitivity): 81 ng/L — ABNORMAL HIGH (ref <18)

## 2019-12-10 LAB — SARS CORONAVIRUS 2 BY RT PCR (HOSPITAL ORDER, PERFORMED IN ~~LOC~~ HOSPITAL LAB): SARS Coronavirus 2: NEGATIVE

## 2019-12-10 MED ORDER — WARFARIN SODIUM 5 MG PO TABS: 5.0000 mg | ORAL_TABLET | Freq: Every day | ORAL | Status: DC

## 2019-12-10 MED ORDER — TIOTROPIUM BROMIDE MONOHYDRATE 18 MCG IN CAPS
18.0000 ug | ORAL_CAPSULE | Freq: Every day | RESPIRATORY_TRACT | Status: DC
  Administered 2019-12-10 – 2019-12-13 (×4): 18 ug via RESPIRATORY_TRACT
  Filled 2019-12-10: qty 5

## 2019-12-10 MED ORDER — ASPIRIN EC 81 MG PO TBEC
81.0000 mg | DELAYED_RELEASE_TABLET | Freq: Every day | ORAL | Status: DC
  Administered 2019-12-10 – 2019-12-13 (×4): 81 mg via ORAL
  Filled 2019-12-10 (×4): qty 1

## 2019-12-10 MED ORDER — SIMVASTATIN 20 MG PO TABS
40.0000 mg | ORAL_TABLET | Freq: Every day | ORAL | Status: DC
  Administered 2019-12-10 – 2019-12-13 (×4): 40 mg via ORAL
  Filled 2019-12-10 (×4): qty 2

## 2019-12-10 MED ORDER — CEFAZOLIN SODIUM-DEXTROSE 1-4 GM/50ML-% IV SOLN
1.0000 g | Freq: Once | INTRAVENOUS | Status: AC
  Administered 2019-12-10: 1 g via INTRAVENOUS
  Filled 2019-12-10 (×2): qty 50

## 2019-12-10 MED ORDER — MORPHINE SULFATE (PF) 2 MG/ML IV SOLN
2.0000 mg | INTRAVENOUS | Status: DC | PRN
  Administered 2019-12-10 – 2019-12-11 (×5): 2 mg via INTRAVENOUS
  Filled 2019-12-10 (×5): qty 1

## 2019-12-10 MED ORDER — DILTIAZEM HCL ER COATED BEADS 240 MG PO CP24
240.0000 mg | ORAL_CAPSULE | Freq: Once | ORAL | Status: AC
  Administered 2019-12-10: 240 mg via ORAL
  Filled 2019-12-10: qty 1

## 2019-12-10 MED ORDER — WARFARIN SODIUM 4 MG PO TABS
4.0000 mg | ORAL_TABLET | Freq: Once | ORAL | Status: AC
  Administered 2019-12-10: 4 mg via ORAL
  Filled 2019-12-10: qty 1

## 2019-12-10 MED ORDER — WARFARIN - PHARMACIST DOSING INPATIENT: Freq: Every day | Status: DC

## 2019-12-10 MED ORDER — IPRATROPIUM-ALBUTEROL 0.5-2.5 (3) MG/3ML IN SOLN: 3.0000 mL | Freq: Once | RESPIRATORY_TRACT | Status: AC

## 2019-12-10 MED ORDER — IPRATROPIUM-ALBUTEROL 0.5-2.5 (3) MG/3ML IN SOLN
3.0000 mL | Freq: Once | RESPIRATORY_TRACT | Status: AC
  Administered 2019-12-10: 3 mL via RESPIRATORY_TRACT

## 2019-12-10 MED ORDER — FUROSEMIDE 10 MG/ML IJ SOLN
40.0000 mg | Freq: Two times a day (BID) | INTRAMUSCULAR | Status: DC
  Administered 2019-12-10 – 2019-12-11 (×3): 40 mg via INTRAVENOUS
  Filled 2019-12-10 (×3): qty 4

## 2019-12-10 MED ORDER — INSULIN ASPART 100 UNIT/ML ~~LOC~~ SOLN
0.0000 [IU] | Freq: Three times a day (TID) | SUBCUTANEOUS | Status: DC
  Administered 2019-12-10: 4 [IU] via SUBCUTANEOUS
  Administered 2019-12-10: 11 [IU] via SUBCUTANEOUS
  Administered 2019-12-11 – 2019-12-13 (×3): 3 [IU] via SUBCUTANEOUS
  Filled 2019-12-10 (×5): qty 1

## 2019-12-10 MED ORDER — FENTANYL CITRATE (PF) 100 MCG/2ML IJ SOLN
50.0000 ug | Freq: Once | INTRAMUSCULAR | Status: AC
  Administered 2019-12-10: 50 ug via INTRAVENOUS
  Filled 2019-12-10: qty 2

## 2019-12-10 MED ORDER — SODIUM CHLORIDE 0.9% FLUSH: 3.0000 mL | INTRAVENOUS | Status: DC | PRN

## 2019-12-10 MED ORDER — MOMETASONE FURO-FORMOTEROL FUM 200-5 MCG/ACT IN AERO
2.0000 | INHALATION_SPRAY | Freq: Two times a day (BID) | RESPIRATORY_TRACT | Status: DC
  Administered 2019-12-10 – 2019-12-13 (×7): 2 via RESPIRATORY_TRACT
  Filled 2019-12-10: qty 8.8

## 2019-12-10 MED ORDER — POTASSIUM CHLORIDE 10 MEQ/100ML IV SOLN
10.0000 meq | INTRAVENOUS | Status: AC
  Administered 2019-12-10 (×2): 10 meq via INTRAVENOUS
  Filled 2019-12-10: qty 100

## 2019-12-10 MED ORDER — ONDANSETRON HCL 4 MG/2ML IJ SOLN: 4.0000 mg | Freq: Once | INTRAMUSCULAR | Status: AC

## 2019-12-10 MED ORDER — DILTIAZEM HCL ER COATED BEADS 120 MG PO CP24
240.0000 mg | ORAL_CAPSULE | Freq: Every day | ORAL | Status: DC
  Administered 2019-12-11 – 2019-12-13 (×3): 240 mg via ORAL
  Filled 2019-12-10 (×3): qty 2

## 2019-12-10 MED ORDER — FENTANYL CITRATE (PF) 100 MCG/2ML IJ SOLN: 50.0000 ug | Freq: Once | INTRAMUSCULAR | Status: AC

## 2019-12-10 MED ORDER — IPRATROPIUM-ALBUTEROL 0.5-2.5 (3) MG/3ML IN SOLN
RESPIRATORY_TRACT | Status: AC
  Administered 2019-12-10: 3 mL via RESPIRATORY_TRACT
  Filled 2019-12-10: qty 9

## 2019-12-10 MED ORDER — MAGNESIUM SULFATE 2 GM/50ML IV SOLN
2.0000 g | Freq: Once | INTRAVENOUS | Status: DC
  Filled 2019-12-10: qty 50

## 2019-12-10 MED ORDER — ONDANSETRON HCL 4 MG PO TABS: 4.0000 mg | ORAL_TABLET | Freq: Four times a day (QID) | ORAL | Status: DC | PRN

## 2019-12-10 MED ORDER — POTASSIUM CHLORIDE CRYS ER 20 MEQ PO TBCR
40.0000 meq | EXTENDED_RELEASE_TABLET | Freq: Once | ORAL | Status: AC
  Administered 2019-12-10: 40 meq via ORAL
  Filled 2019-12-10: qty 2

## 2019-12-10 MED ORDER — IPRATROPIUM-ALBUTEROL 0.5-2.5 (3) MG/3ML IN SOLN: 3.0000 mL | Freq: Four times a day (QID) | RESPIRATORY_TRACT | Status: DC | PRN

## 2019-12-10 MED ORDER — ALLOPURINOL 300 MG PO TABS
300.0000 mg | ORAL_TABLET | Freq: Every day | ORAL | Status: DC
  Administered 2019-12-10 – 2019-12-13 (×4): 300 mg via ORAL
  Filled 2019-12-10 (×4): qty 1

## 2019-12-10 MED ORDER — SODIUM CHLORIDE 0.9 % IV SOLN: 250.0000 mL | INTRAVENOUS | Status: DC | PRN

## 2019-12-10 MED ORDER — COLCHICINE 0.6 MG PO TABS
0.6000 mg | ORAL_TABLET | ORAL | Status: DC
  Administered 2019-12-10 – 2019-12-12 (×2): 0.6 mg via ORAL
  Filled 2019-12-10 (×3): qty 1

## 2019-12-10 MED ORDER — SODIUM CHLORIDE 0.9% FLUSH
3.0000 mL | Freq: Two times a day (BID) | INTRAVENOUS | Status: DC
  Administered 2019-12-10 – 2019-12-13 (×7): 3 mL via INTRAVENOUS

## 2019-12-10 MED ORDER — DILTIAZEM HCL 25 MG/5ML IV SOLN
20.0000 mg | Freq: Once | INTRAVENOUS | Status: AC
  Administered 2019-12-10: 20 mg via INTRAVENOUS
  Filled 2019-12-10: qty 5

## 2019-12-10 MED ORDER — ONDANSETRON HCL 4 MG/2ML IJ SOLN
4.0000 mg | Freq: Four times a day (QID) | INTRAMUSCULAR | Status: DC | PRN
  Administered 2019-12-11 (×2): 4 mg via INTRAVENOUS
  Filled 2019-12-10 (×2): qty 2

## 2019-12-10 MED ORDER — ADULT MULTIVITAMIN W/MINERALS CH
1.0000 | ORAL_TABLET | Freq: Every day | ORAL | Status: DC
  Administered 2019-12-10 – 2019-12-13 (×4): 1 via ORAL
  Filled 2019-12-10 (×4): qty 1

## 2019-12-10 MED ORDER — FENTANYL CITRATE (PF) 100 MCG/2ML IJ SOLN
INTRAMUSCULAR | Status: AC
  Administered 2019-12-10: 50 ug via INTRAVENOUS
  Filled 2019-12-10: qty 2

## 2019-12-10 MED ORDER — ONDANSETRON HCL 4 MG/2ML IJ SOLN
INTRAMUSCULAR | Status: AC
  Administered 2019-12-10: 4 mg via INTRAVENOUS
  Filled 2019-12-10: qty 2

## 2019-12-10 MED ORDER — POTASSIUM CHLORIDE CRYS ER 20 MEQ PO TBCR
40.0000 meq | EXTENDED_RELEASE_TABLET | Freq: Two times a day (BID) | ORAL | Status: DC
  Administered 2019-12-10 – 2019-12-13 (×6): 40 meq via ORAL
  Filled 2019-12-10 (×6): qty 2

## 2019-12-10 MED ORDER — HYDROCODONE-ACETAMINOPHEN 5-325 MG PO TABS
1.0000 | ORAL_TABLET | Freq: Four times a day (QID) | ORAL | Status: DC | PRN
  Administered 2019-12-10: 1 via ORAL
  Filled 2019-12-10: qty 1

## 2019-12-10 MED ORDER — MENTHOL 3 MG MT LOZG
1.0000 | LOZENGE | OROMUCOSAL | Status: DC | PRN
  Administered 2019-12-12: 3 mg via ORAL
  Filled 2019-12-10 (×3): qty 9

## 2019-12-10 NOTE — ED Provider Notes (Addendum)
[facility] Emergency Department Provider Note  ____________________________________________  Time seen: Approximately 5:45 AM  I have reviewed the triage vital signs and the nursing notes.   HISTORY  Chief Complaint Respiratory Distress   HPI [name] Hardin is a 86 y.o. male with a history of A. fib on Coumadin, hypertension, COPD, PE who presents for evaluation of shortness of breath.  Patient reports being in his usual state of health when he went to sleep.  Woke up this morning to go to the bathroom and developed sudden onset of shortness of breath.  When EMS arrived patient was satting in the mid 70s.  Complaining of chest tightness as well.  He was given 125 mg of Solu-Medrol and 2 breathing treatments in route.  Patient denies any personal or family history of blood clots.  He is on Coumadin.  Shortness of breath is constant and severe.   Past Medical History:  Diagnosis Date  . ATRIAL FIBRILLATION 03/05/2010   Qualifier: Diagnosis of  By: Lovena Le, MD, Martyn Malay   . Benign hypertension 12/16/2013  . Cancer ([place])    bladder  . Chronic obstructive pulmonary disease ([place]) 12/16/2013  . COPD (chronic obstructive pulmonary disease) ([place])   . Family history of adverse reaction to anesthesia    Brother has a difficult time waking up.  Marland Kitchen Hx pulmonary embolism 08/2000  . Hypercholesterolemia   . Hyperglycemia   . Hypertension   . Osteoarthritis   . Pre-diabetes   . PREMATURE VENTRICULAR CONTRACTIONS 03/05/2010   Qualifier: Diagnosis of  By: Lovena Le, MD, Martyn Malay   . Pulmonary embolism ([place]) 12/16/2013  . Stroke [facility]) 1997    Patient Active Problem List   Diagnosis Date Noted  . H/O knee surgery 01/17/2015  . History of knee surgery 01/17/2015  . Body mass index of 60 or higher 04/20/2014  . Morbid (severe) obesity due to excess calories ([place]) 04/20/2014  . Chronic kidney disease (CKD), stage III (moderate) 12/16/2013  . Atrial  flutter ([place]) 12/16/2013  . Gout 12/16/2013  . Benign hypertension 12/16/2013  . Type 2 diabetes mellitus with other specified complication ([place]) [id number]  . Arthritis, degenerative 12/16/2013  . [place] anemia 12/16/2013  . PE (pulmonary embolism) 12/16/2013  . Diabetes mellitus, type 2 ([place]) 12/16/2013  . Chronic obstructive pulmonary disease ([place]) 12/16/2013  . Pulmonary embolism ([place]) 12/16/2013  . Type 2 diabetes mellitus ([place]) 12/16/2013  . Long term current use of anticoagulant 11/09/2013  . OBESITY, UNSPECIFIED 03/05/2010  . ATRIAL FIBRILLATION 03/05/2010  . PREMATURE VENTRICULAR CONTRACTIONS 03/05/2010  . COPD 03/05/2010  . SHORTNESS OF BREATH 03/05/2010  . DYSPNEA ON EXERTION 03/05/2010    Past Surgical History:  Procedure Laterality Date  . CYSTOSCOPY W/ RETROGRADES Bilateral 05/18/2017   Procedure: CYSTOSCOPY WITH RETROGRADE PYELOGRAM;  Surgeon: Hollice Espy, MD;  Location: [facility] ORS;  Service: Urology;  Laterality: Bilateral;  . CYSTOSCOPY WITH BIOPSY N/A 05/18/2017   Procedure: CYSTOSCOPY WITH BIOPSY;  Surgeon: Hollice Espy, MD;  Location: [facility] ORS;  Service: Urology;  Laterality: N/A;  . JOINT REPLACEMENT Right   . KYPHOPLASTY N/A 10/25/2019   Procedure: L2 KYPHOPLASTY;  Surgeon: Hessie Knows, MD;  Location: [facility] ORS;  Service: Orthopedics;  Laterality: N/A;  . PERIPHERAL VASCULAR CATHETERIZATION N/A 02/04/2015   Procedure: IVC Filter Removal;  Surgeon: Katha Cabal, MD;  Location: [place] [facility];  Service: Cardiovascular;  Laterality: N/A;  . TONSILLECTOMY    . URETEROSCOPY Left 05/18/2017   Procedure:  URETEROSCOPY;  Surgeon: Hollice Espy, MD;  Location: [facility] ORS;  Service: Urology;  Laterality: Left;    Prior to Admission medications   Medication Sig Start Date End Date Taking? Authorizing Provider  allopurinol (ZYLOPRIM) 300 MG tablet Take 300 mg by mouth daily. In am    [provider]  aspirin EC 81 MG tablet Take 81 mg by mouth  daily.    [provider]  colchicine (COLCRYS) 0.6 MG tablet Take 0.6 mg by mouth every other day.     [provider]  diltiazem (CARDIZEM CD) 240 MG 24 hr capsule Take 240 mg by mouth daily. In am    [provider]  Fluticasone-Salmeterol (ADVAIR DISKUS) 250-50 MCG/DOSE AEPB Inhale 1 puff into the lungs 2 (two) times daily.      [provider]  HYDROcodone-acetaminophen (NORCO) 5-325 MG tablet Take 1 tablet by mouth every 6 (six) hours as needed for moderate pain. 10/25/19   Hessie Knows, MD  Multiple Vitamin (MULTIVITAMIN WITH MINERALS) TABS tablet Take 1 tablet by mouth daily.    [provider]  potassium chloride (KLOR-CON) 10 MEQ CR tablet Take 10 mEq by mouth daily. In am    [provider]  simvastatin (ZOCOR) 40 MG tablet Take 40 mg by mouth every morning.     [provider]  tiotropium (SPIRIVA HANDIHALER) 18 MCG inhalation capsule Place 18 mcg into inhaler and inhale daily. In am    [provider]  torsemide (DEMADEX) 20 MG tablet Take 20 mg by mouth daily.  07/25/19   [provider]  warfarin (COUMADIN) 5 MG tablet Take 5 mg by mouth daily at 6 PM.     [provider]    Allergies Patient has no known allergies.  Family History  Problem Relation Age of Onset  . Prostate cancer Neg Hx   . Kidney cancer Neg Hx     Social History Social History   Tobacco Use  . Smoking status: Former Smoker    Quit date: 05/06/1991    Years since quitting: 28.6  . Smokeless tobacco: Never Used  Substance Use Topics  . Alcohol use: No    Alcohol/week: 0.0 standard drinks  . Drug use: No    Review of Systems  Constitutional: Negative for fever. Eyes: Negative for visual changes. ENT: Negative for sore throat. Neck: No neck pain  Cardiovascular: Negative for chest pain. Respiratory: + shortness of breath. Gastrointestinal: Negative for abdominal pain, vomiting or diarrhea. Genitourinary:  Negative for dysuria. Musculoskeletal: Negative for back pain. Skin: Negative for rash. Neurological: Negative for headaches, weakness or numbness. Psych: No SI or HI  ____________________________________________   PHYSICAL EXAM:  VITAL SIGNS: ED Triage Vitals  Enc Vitals Group     BP 12/10/19 0459 (!) 132/109     Pulse Rate 12/10/19 0459 (!) 121     Resp 12/10/19 0459 (!) 25     Temp 12/10/19 0459 97.6 F (36.4 C)     Temp src --      SpO2 12/10/19 0459 91 %     Weight 12/10/19 0500 259 lb 14.8 oz (117.9 kg)     Height 12/10/19 0500 5\' 11"  (1.803 m)     Head Circumference --      Peak Flow --      Pain Score 12/10/19 0500 6     Pain Loc --      Pain Edu? --      Excl. in [place]? --  Constitutional: Alert and oriented, moderate respiratory distress.  HEENT:      Head: Normocephalic and atraumatic.         Eyes: Conjunctivae are normal. Sclera is non-icteric.       Mouth/Throat: Mucous membranes are moist.       Neck: Supple with no signs of meningismus. Cardiovascular: Irregularly irregular rhythm with tachycardic rate Respiratory: Moderate respiratory distress, hypoxic, increased work of breathing, absent breath sounds on the right with wheezing on the left  gastrointestinal: Soft, non tender, and non distended with positive bowel sounds Musculoskeletal: 1+ pitting edema bilateral lower extremities  neurologic: Normal speech and language. Face is symmetric. Moving all extremities. No gross focal neurologic deficits are appreciated. Skin: Skin is warm, dry and intact. No rash noted. Psychiatric: Mood and affect are normal. Speech and behavior are normal.  ____________________________________________   LABS (all labs ordered are listed, but only abnormal results are displayed)  Labs Reviewed  COMPREHENSIVE METABOLIC PANEL - Abnormal; Notable for the following components:      Result Value   Potassium 2.9 (*)    Glucose, Bld 148 (*)    BUN 25 (*)    Creatinine,  Ser 1.34 (*)    GFR calc non Af Amer 48 (*)    GFR calc Af Amer 56 (*)    All other components within normal limits  CBC WITH DIFFERENTIAL/PLATELET - Abnormal; Notable for the following components:   RBC 3.73 (*)    Hemoglobin 11.9 (*)    HCT 37.0 (*)    RDW 15.9 (*)    Platelets 149 (*)    All other components within normal limits  TROPONIN I (HIGH SENSITIVITY) - Abnormal; Notable for the following components:   Troponin I (High Sensitivity) 46 (*)    All other components within normal limits  SARS CORONAVIRUS 2 BY RT PCR (HOSPITAL ORDER, [place] [facility])  BLOOD GAS, VENOUS  PROTIME-INR  TROPONIN I (HIGH SENSITIVITY)   ____________________________________________  EKG  ED ECG REPORT I, Rudene Re, the attending physician, personally viewed and interpreted this ECG.  Atrial fibrillation with rate of 127, prolonged QTC, RBBB, no ST elevation.  Unchanged from prior ____________________________________________  RADIOLOGY  I have personally reviewed the images performed during this visit and I agree with the Radiologist's read.   Interpretation by Radiologist:  DG Chest Portable 1 View  Result Date: 12/10/2019 CLINICAL DATA:  Follow-up chest tube. EXAM: PORTABLE CHEST 1 VIEW COMPARISON:  Chest radiograph from earlier today. FINDINGS: Right apical chest tube terminates in the medial right apical pleural space. Stable cardiomediastinal silhouette with mild cardiomegaly. No pneumothorax. No pleural effusion. Stable coarsened interstitial opacities in both lungs with mild left basilar atelectasis. IMPRESSION: 1. No pneumothorax. Right apical chest tube in place. 2. Stable mild cardiomegaly. 3. Stable coarsened interstitial opacities in both lungs with mild left basilar atelectasis. Electronically Signed   By: Ilona Sorrel M.D.   On: 12/10/2019 07:13   DG Chest Portable 1 View  Result Date: 12/10/2019 CLINICAL DATA:  Pneumothorax, chest tube placement  EXAM: PORTABLE CHEST 1 VIEW COMPARISON:  Chest radiograph from earlier today. FINDINGS: Right chest tube terminates in apparent extrapleural location along the lateral margin of right fourth rib with surrounding mild lateral right chest wall subcutaneous emphysema. Stable cardiomediastinal silhouette with mild cardiomegaly. No appreciable residual pneumothorax. No pleural effusion. Diffuse interstitial opacities with resolved right lung atelectasis. IMPRESSION: 1. No appreciable residual pneumothorax. Right chest tube terminates in apparent extrapleural location along  the lateral margin of the right fourth rib with surrounding mild lateral right chest wall subcutaneous emphysema. 2. Stable mild cardiomegaly with diffuse interstitial opacities, suggesting mild pulmonary edema. These results were called by telephone at the time of interpretation on 12/10/2019 at 6:22 am to provider [facility] , who verbally acknowledged these results. Electronically Signed   By: Ilona Sorrel M.D.   On: 12/10/2019 06:23   DG Chest Portable 1 View  Result Date: 12/10/2019 CLINICAL DATA:  Dyspnea EXAM: PORTABLE CHEST 1 VIEW COMPARISON:  11/30/2011 chest CT FINDINGS: Mild cardiomegaly. Large right pneumothorax, approximately 60-70 %. No left pneumothorax. Slight left mediastinal shift. Complete right lung atelectasis. Mild interstitial prominence throughout the left lung. IMPRESSION: 1. Large right pneumothorax with slight left mediastinal shift, suggesting tension pneumothorax. Complete right lung atelectasis. 2. Mild cardiomegaly. Mild interstitial prominence throughout the left lung, cannot exclude mild pulmonary edema. Critical Value/emergent results were called by telephone at the time of interpretation on 12/10/2019 at 5:13 am to provider DR. Nancy Fetter, who verbally acknowledged these results. Electronically Signed   By: Ilona Sorrel M.D.   On: 12/10/2019 05:17     ____________________________________________   PROCEDURES  Procedure(s) performed:yes CHEST TUBE INSERTION  Date/Time: 12/10/2019 5:46 AM Performed by: Rudene Re, MD Authorized by: Rudene Re, MD   Consent:    Consent obtained:  Written   Consent given by:  Patient   Risks discussed:  Bleeding, incomplete drainage, nerve damage, pain, infection and damage to surrounding structures   Alternatives discussed:  Delayed treatment Pre-procedure details:    Skin preparation:  Betadine   Preparation: Patient was prepped and draped in the usual sterile fashion   Sedation:    Sedation type:  Anxiolysis Anesthesia (see MAR for exact dosages):    Anesthesia method:  Local infiltration   Local anesthetic:  Lidocaine 1% WITH epi Procedure details:    Placement location:  R lateral   Scalpel size:  11   Tube size (Fr):  24   Dissection instrument:  Finger and Kelly clamp   Ultrasound guidance: no     Tension pneumothorax: yes     Tube connected to:  Heimlich valve, suction and water seal   Drainage characteristics:  Air only   Suture material:  0 silk   Dressing:  4x4 sterile gauze Post-procedure details:    Post-insertion x-ray findings: tube in good position     Patient tolerance of procedure:  Tolerated well, no immediate complications   Critical Care performed: yes  CRITICAL CARE Performed by: Rudene Re  ?  Total critical care time: 45 min  Critical care time was exclusive of separately billable procedures and treating other patients.  Critical care was necessary to treat or prevent imminent or life-threatening deterioration.  Critical care was time spent personally by me on the following activities: development of treatment plan with patient and/or surrogate as well as nursing, discussions with consultants, evaluation of patient's response to treatment, examination of patient, obtaining history from patient or surrogate, ordering and  performing treatments and interventions, ordering and review of laboratory studies, ordering and review of radiographic studies, pulse oximetry and re-evaluation of patient's condition.  ____________________________________________   INITIAL IMPRESSION / ASSESSMENT AND PLAN / ED COURSE   86 y.o. male with a history of A. fib on Coumadin, hypertension, COPD, PE who presents for evaluation of shortness of breath. Patient arrives in moderate/severe respiratory distress with absent breath sounds on the right and wheezing on the left. Immediate chest x-ray was  done confirming a 70% right-sided tension pneumothorax. Patient was placed on high flow nasal cannula to help with his severe respiratory distress and to allow patient to be flat for chest tube placement. He was also started on duo nebs due to significant wheezing on the left. Chest tube was placed per procedure note above. Patient's respiratory status improved significantly. He is currently on 2 L nasal cannula. Patient then went into A. fib with RVR. He was given a dose of IV Cardizem and his morning dose of p.o. long-acting Cardizem. Current rate is in the 110s. Discussed with hospitalist for admission. Evaluation, work-up, results, and plan have been discussed with patient and his daughter who is at bedside. Old medical records reviewed.      _____________________________________________ Please note:  Patient was evaluated in Emergency Department today for the symptoms described in the history of present illness. Patient was evaluated in the context of the global COVID-19 pandemic, which necessitated consideration that the patient might be at risk for infection with the SARS-CoV-2 virus that causes COVID-19. Institutional protocols and algorithms that pertain to the evaluation of patients at risk for COVID-19 are in a state of rapid change based on information released by regulatory bodies including the CDC and federal and state organizations.  These policies and algorithms were followed during the patient's care in the ED.  Some ED evaluations and interventions may be delayed as a result of limited staffing during the pandemic.   [place] Controlled Substance Database was reviewed by me. ____________________________________________   FINAL CLINICAL IMPRESSION(S) / ED DIAGNOSES   Final diagnoses:  Tension pneumothorax, spontaneous  Atrial fibrillation with RVR (HCC)  Hypokalemia      NEW MEDICATIONS STARTED DURING THIS VISIT:  ED Discharge Orders    None       Note:  This document was prepared using Dragon voice recognition software and may include unintentional dictation errors.    Alfred Levins, [place], MD 12/10/19 [place], [place], [place] 12/10/19 [phone]

## 2019-12-10 NOTE — ED Notes (Signed)
Pt ate 90% of breakfast.  

## 2019-12-10 NOTE — H&P (Addendum)
History and Physical    CHRIST KIRN [id number] DOB: 02-19-1932 DOA: 12/10/2019  PCP: [name], MD   Patient coming from: Home  I have personally briefly reviewed patient's old medical records in [place]  Chief Complaint: Shortness of breath  HPI: [name] Hardin is a 86 y.o. male with medical history significant for atrial fibrillation on chronic anticoagulation therapy, diabetes mellitus, hypertension, COPD who was in his usual state of health and woke up in the early hours of the morning to go to the bathroom when he developed sudden onset shortness of breath.  Per patient's daughter he felt something "pop" prior to developing respiratory distress.  When EMS arrived patient's pulse oximetry on room air was in the 70s.  He also complained of chest tightness.  Patient received Solu-Medrol 125 mg as well as DuoNeb in the field. Patient was noted to be in respiratory distress upon arrival to the ER, lung sounds were absent on the right and a stat chest x-ray showed large right pneumothorax with slight left mediastinal shift, suggesting tension pneumothorax. Complete right lung atelectasis.  Mild cardiomegaly. Mild interstitial prominence throughout the left lung, cannot exclude mild pulmonary edema. A chest tube was inserted emergently with resolution of the pneumothorax. Patient was also noted to be in A. fib with a rapid ventricular rate and received Cardizem 20 mg IV push in the emergency room.  He also received a dose of his oral Cardizem. Patient has had chronic lower extremity swelling for which his diuretic therapy was recently increased.  He denies having orthopnea or paroxysmal nocturnal dyspnea. He denies having any nausea, vomiting, diaphoresis or palpitations.  He denies having any fever or chills, headache or mental status changes.    ED Course:  86 y.o. male with a history of A. fib on Coumadin, hypertension, COPD, PE who presents for evaluation of  shortness of breath. Patient arrived in moderate/severe respiratory distress with absent breath sounds on the right and wheezing on the left. Immediate chest x-ray was done confirming a 70% right-sided tension pneumothorax. Patient was placed on high flow nasal cannula to help with his severe respiratory distress and to allow patient to be flat for chest tube placement. He was also started on duo nebs due to significant wheezing on the left. Chest tube was placed per procedure note above. Patient's respiratory status improved significantly. He is currently on 2 L nasal cannula. Patient then went into A. fib with RVR. He was given a dose of IV Cardizem and his morning dose of p.o. long-acting Cardizem. Current rate is in the 110s.  Patient will be admitted to the hospital for further evaluation  Review of Systems: As per HPI otherwise 10 point review of systems negative.    Past Medical History:  Diagnosis Date  . ATRIAL FIBRILLATION 03/05/2010   Qualifier: Diagnosis of  By: Lovena Le, MD, Martyn Malay   . Benign hypertension 12/16/2013  . Cancer ([place])    bladder  . Chronic obstructive pulmonary disease ([place]) 12/16/2013  . COPD (chronic obstructive pulmonary disease) ([place])   . Family history of adverse reaction to anesthesia    Brother has a difficult time waking up.  Marland Kitchen Hx pulmonary embolism 08/2000  . Hypercholesterolemia   . Hyperglycemia   . Hypertension   . Osteoarthritis   . Pre-diabetes   . PREMATURE VENTRICULAR CONTRACTIONS 03/05/2010   Qualifier: Diagnosis of  By: Lovena Le, MD, Martyn Malay   . Pulmonary embolism ([place]) 12/16/2013  .  Stroke [facility]) 1997    Past Surgical History:  Procedure Laterality Date  . CYSTOSCOPY W/ RETROGRADES Bilateral 05/18/2017   Procedure: CYSTOSCOPY WITH RETROGRADE PYELOGRAM;  Surgeon: Hollice Espy, MD;  Location: [facility] ORS;  Service: Urology;  Laterality: Bilateral;  . CYSTOSCOPY WITH BIOPSY N/A 05/18/2017   Procedure: CYSTOSCOPY WITH BIOPSY;   Surgeon: Hollice Espy, MD;  Location: [facility] ORS;  Service: Urology;  Laterality: N/A;  . JOINT REPLACEMENT Right   . KYPHOPLASTY N/A 10/25/2019   Procedure: L2 KYPHOPLASTY;  Surgeon: Hessie Knows, MD;  Location: [facility] ORS;  Service: Orthopedics;  Laterality: N/A;  . PERIPHERAL VASCULAR CATHETERIZATION N/A 02/04/2015   Procedure: IVC Filter Removal;  Surgeon: Katha Cabal, MD;  Location: [place] [facility];  Service: Cardiovascular;  Laterality: N/A;  . TONSILLECTOMY    . URETEROSCOPY Left 05/18/2017   Procedure: URETEROSCOPY;  Surgeon: Hollice Espy, MD;  Location: [facility] OR[facility];  Service: Urology;  Laterality: Left;     reports that he quit smoking about 28 years ago. He has never used smokeless tobacco. He reports that he does not drink alcohol or use drugs.  No Known Allergies  Family History  Problem Relation Age of Onset  . Prostate cancer Neg Hx   . Kidney cancer Neg Hx      Prior to Admission medications   Medication Sig Start Date End Date Taking? Authorizing Provider  allopurinol (ZYLOPRIM) 300 MG tablet Take 300 mg by mouth daily. In am    [provider]  aspirin EC 81 MG tablet Take 81 mg by mouth daily.    [provider]  colchicine (COLCRYS) 0.6 MG tablet Take 0.6 mg by mouth every other day.     [provider]  diltiazem (CARDIZEM CD) 240 MG 24 hr capsule Take 240 mg by mouth daily. In am    [provider]  Fluticasone-Salmeterol (ADVAIR DISKUS) 250-50 MCG/DOSE AEPB Inhale 1 puff into the lungs 2 (two) times daily.      [provider]  HYDROcodone-acetaminophen (NORCO) 5-325 MG tablet Take 1 tablet by mouth every 6 (six) hours as needed for moderate pain. 10/25/19   Hessie Knows, MD  Multiple Vitamin (MULTIVITAMIN WITH MINERALS) TABS tablet Take 1 tablet by mouth daily.    [provider]  potassium chloride (KLOR-CON) 10 MEQ CR tablet Take 10 mEq by mouth daily. In am    [provider]  simvastatin  (ZOCOR) 40 MG tablet Take 40 mg by mouth every morning.     [provider]  tiotropium (SPIRIVA HANDIHALER) 18 MCG inhalation capsule Place 18 mcg into inhaler and inhale daily. In am    [provider]  torsemide (DEMADEX) 20 MG tablet Take 20 mg by mouth daily.  07/25/19   [provider]  warfarin (COUMADIN) 5 MG tablet Take 5 mg by mouth daily at 6 PM.     [provider]    Physical Exam: Vitals:   12/10/19 0500 12/10/19 0715 12/10/19 0730 12/10/19 0802  BP:  111/70 114/62 (!) 107/57  Pulse:  (!) 124 (!) 156 (!) 123  Resp:  20 20 14   Temp:      SpO2:  93% 94% 99%  Weight: 117.9 kg     Height: 5\' 11"  (1.803 m)        Vitals:   12/10/19 0500 12/10/19 0715 12/10/19 0730 12/10/19 0802  BP:  111/70 114/62 (!) 107/57  Pulse:  (!) 124 (!) 156 (!) 123  Resp:  20  20 14  Temp:      SpO2:  93% 94% 99%  Weight: 117.9 kg     Height: 5\' 11"  (1.803 m)       Constitutional: NAD, alert and oriented x 3.  Appears comfortable and in no distress Eyes: PERRL, lids and conjunctivae normal ENMT: Mucous membranes are moist.  Neck: normal, supple, no masses, no thyromegaly Respiratory:  Air entry in both lung fields, scattered wheezing, no crackles. Normal respiratory effort. No accessory muscle use.  Chest tube in right lateral chest wall Cardiovascular: Irregularly irregular (tachycardic), no murmurs / rubs / gallops. 3+ extremity edema. 2+ pedal pulses. No carotid bruits.  Abdomen: no tenderness, no masses palpated. No hepatosplenomegaly. Bowel sounds positive.  Central adiposity Musculoskeletal: no clubbing / cyanosis. No joint deformity upper and lower extremities.  Skin: no rashes, lesions, ulcers.  Neurologic: No gross focal neurologic deficit. Psychiatric: Normal mood and affect.   Labs on Admission: I have personally reviewed following labs and imaging studies  CBC: Recent Labs  Lab 12/10/19 0503  WBC 8.4  NEUTROABS 4.7  HGB 11.9*  HCT  37.0*  MCV 99.2  PLT 123456*   Basic Metabolic Panel: Recent Labs  Lab 12/10/19 0503  NA 140  K 2.9*  CL 102  CO2 25  GLUCOSE 148*  BUN 25*  CREATININE 1.34*  CALCIUM 8.9   GFR: Estimated Creatinine Clearance: 52.6 mL/min (A) (by C-G formula based on SCr of 1.34 mg/dL (H)). Liver Function Tests: Recent Labs  Lab 12/10/19 0503  AST 29  ALT 18  ALKPHOS 101  BILITOT 1.1  PROT 6.7  ALBUMIN 3.8   No results for input(s): LIPASE, AMYLASE in the last 168 hours. No results for input(s): AMMONIA in the last 168 hours. Coagulation Profile: No results for input(s): INR, PROTIME in the last 168 hours. Cardiac Enzymes: No results for input(s): CKTOTAL, CKMB, CKMBINDEX, TROPONINI in the last 168 hours. BNP (last 3 results) No results for input(s): PROBNP in the last 8760 hours. HbA1C: No results for input(s): HGBA1C in the last 72 hours. CBG: No results for input(s): GLUCAP in the last 168 hours. Lipid Profile: No results for input(s): CHOL, HDL, LDLCALC, TRIG, CHOLHDL, LDLDIRECT in the last 72 hours. Thyroid Function Tests: No results for input(s): TSH, T4TOTAL, FREET4, T3FREE, THYROIDAB in the last 72 hours. Anemia Panel: No results for input(s): VITAMINB12, FOLATE, FERRITIN, TIBC, IRON, RETICCTPCT in the last 72 hours. Urine analysis:    Component Value Date/Time   COLORURINE Amber 11/01/2011 1313   APPEARANCEUR Cloudy (A) 08/09/2019 0938   LABSPEC 1.024 11/01/2011 1313   PHURINE 5.0 11/01/2011 1313   GLUCOSEU Trace (A) 08/09/2019 0938   GLUCOSEU Negative 11/01/2011 1313   HGBUR 3+ 11/01/2011 1313   BILIRUBINUR Negative 08/09/2019 0938   BILIRUBINUR Negative 11/01/2011 1313   KETONESUR Negative 11/01/2011 1313   PROTEINUR 2+ (A) 08/09/2019 0938   PROTEINUR 100 mg/dL 11/01/2011 1313   NITRITE Negative 08/09/2019 0938   NITRITE Negative 11/01/2011 1313   LEUKOCYTESUR Negative 08/09/2019 0938   LEUKOCYTESUR 1+ 11/01/2011 1313    Radiological Exams on Admission: DG  Chest Portable 1 View  Result Date: 12/10/2019 CLINICAL DATA:  Follow-up chest tube. EXAM: PORTABLE CHEST 1 VIEW COMPARISON:  Chest radiograph from earlier today. FINDINGS: Right apical chest tube terminates in the medial right apical pleural space. Stable cardiomediastinal silhouette with mild cardiomegaly. No pneumothorax. No pleural effusion. Stable coarsened interstitial opacities in both lungs with mild left basilar atelectasis. IMPRESSION: 1. No pneumothorax.  Right apical chest tube in place. 2. Stable mild cardiomegaly. 3. Stable coarsened interstitial opacities in both lungs with mild left basilar atelectasis. Electronically Signed   By: Ilona Sorrel M.D.   On: 12/10/2019 07:13   DG Chest Portable 1 View  Result Date: 12/10/2019 CLINICAL DATA:  Pneumothorax, chest tube placement EXAM: PORTABLE CHEST 1 VIEW COMPARISON:  Chest radiograph from earlier today. FINDINGS: Right chest tube terminates in apparent extrapleural location along the lateral margin of right fourth rib with surrounding mild lateral right chest wall subcutaneous emphysema. Stable cardiomediastinal silhouette with mild cardiomegaly. No appreciable residual pneumothorax. No pleural effusion. Diffuse interstitial opacities with resolved right lung atelectasis. IMPRESSION: 1. No appreciable residual pneumothorax. Right chest tube terminates in apparent extrapleural location along the lateral margin of the right fourth rib with surrounding mild lateral right chest wall subcutaneous emphysema. 2. Stable mild cardiomegaly with diffuse interstitial opacities, suggesting mild pulmonary edema. These results were called by telephone at the time of interpretation on 12/10/2019 at 6:22 am to provider [facility] , who verbally acknowledged these results. Electronically Signed   By: Ilona Sorrel M.D.   On: 12/10/2019 06:23   DG Chest Portable 1 View  Result Date: 12/10/2019 CLINICAL DATA:  Dyspnea EXAM: PORTABLE CHEST 1 VIEW COMPARISON:   11/30/2011 chest CT FINDINGS: Mild cardiomegaly. Large right pneumothorax, approximately 60-70 %. No left pneumothorax. Slight left mediastinal shift. Complete right lung atelectasis. Mild interstitial prominence throughout the left lung. IMPRESSION: 1. Large right pneumothorax with slight left mediastinal shift, suggesting tension pneumothorax. Complete right lung atelectasis. 2. Mild cardiomegaly. Mild interstitial prominence throughout the left lung, cannot exclude mild pulmonary edema. Critical Value/emergent results were called by telephone at the time of interpretation on 12/10/2019 at 5:13 am to provider DR. Nancy Fetter, who verbally acknowledged these results. Electronically Signed   By: Ilona Sorrel M.D.   On: 12/10/2019 05:17    EKG: Independently reviewed.  Rapid atrial fibrillation  Assessment/Plan Principal Problem:   Pneumothorax on right Active Problems:   Atrial fibrillation with rapid ventricular response (HCC)   Gout   Benign hypertension   Chronic obstructive pulmonary disease (HCC)   Type 2 diabetes mellitus (HCC)   Acute CHF (congestive heart failure) (HCC)    Right-sided pneumothorax (POA) Patient presented to the emergency room in respiratory distress chest x-ray showed large right pneumothorax with slight left mediastinal shift, suggesting tension pneumothorax. Complete right lung atelectasis. Patient is status post chest tube insertion with complete resolution We will request CT surgery consult   Atrial fibrillation with rapid ventricular rate Patient has a history of chronic A. fib for which he is on oral Cardizem He was in a rapid ventricular rate upon arrival to the ER and received Cardizem 20 mg IV push Continue Cardizem CD 240mg  daily Continue Coumadin as primary prophylaxis for an acute stroke PT/INR results pending   Acute CHF Etiology unclear Patient with significant lower extremity swelling and shortness of breath Chest x-ray shows stable mild cardiomegaly  with diffuse interstitial opacities,suggesting mild pulmonary edema. Hold torsemide for now and place patient on Lasix 40 mg IV every 12 Maintain low-sodium diet Obtain 2D echocardiogram to assess LVEF   Diabetes mellitus Maintain consistent carbohydrate diet Glycemic control with sliding scale insulin   History of COPD Not in acute exacerbation Continue as needed bronchodilator therapy as well as inhaled steroids Continue Spiriva   Hypokalemia Related to diuretic use Supplement potassium Obtain magnesium level   Hypertension Continue Cardizem   GOUT Continue colchicine  and allopurinol   DVT prophylaxis: Coumadin Code Status: DNR Family Communication: Greater than 50% of time was spent discussing patient's condition and plan of care with him and his daughter at the bedside.  All questions and concerns have been addressed.  He verbalized understanding and agreed with the plan.  CODE STATUS was discussed and patient is a DNR Disposition Plan: Back to previous home environment Consults called: CT surgery    Alliana Mcauliff MD [facility]     12/10/2019, 9:26 AM

## 2019-12-10 NOTE — ED Notes (Signed)
Patient taken off of high flow nasal cannula and placed on 6L

## 2019-12-10 NOTE — ED Notes (Signed)
Rt at bedside. Patient decreased from 6L [place] O2 to 3L. Patient maintaining oxygen saturation at 97-99

## 2019-12-10 NOTE — Progress Notes (Signed)
CRITICAL VALUE ALERT  Critical Value:  Trop 81  Date & Time Notied:  12/10/19, 13:28  Provider Notified: Francine Graven, MD  Orders Received/Actions taken: No new orders at this time

## 2019-12-10 NOTE — ED Notes (Signed)
Report to Annie, RN.

## 2019-12-10 NOTE — Consult Note (Addendum)
ANTICOAGULATION CONSULT NOTE - Initial Consult  Pharmacy Consult for Warfarin  Indication: atrial fibrillation  No Known Allergies  Patient Measurements: Height: 5\' 11"  (180.3 cm) Weight: 123.4 kg (272 lb) IBW/kg (Calculated) : 75.3  Vital Signs: Temp: 98.5 F (36.9 C) (05/31 1215) BP: 116/54 (05/31 1215) Pulse Rate: 104 (05/31 1215)  Labs: Recent Labs    12/10/19 0503  HGB 11.9*  HCT 37.0*  PLT 149*  LABPROT 29.5*  INR 2.9*  CREATININE 1.34*  TROPONINIHS 46*    Estimated Creatinine Clearance: 53.9 mL/min (A) (by C-G formula based on SCr of 1.34 mg/dL (H)).   Medical History: Past Medical History:  Diagnosis Date  . ATRIAL FIBRILLATION 03/05/2010   Qualifier: Diagnosis of  By: Lovena Le, MD, Martyn Malay   . Benign hypertension 12/16/2013  . Cancer ([place])    bladder  . Chronic obstructive pulmonary disease ([place]) 12/16/2013  . COPD (chronic obstructive pulmonary disease) ([place])   . Family history of adverse reaction to anesthesia    Brother has a difficult time waking up.  Marland Kitchen Hx pulmonary embolism 08/2000  . Hypercholesterolemia   . Hyperglycemia   . Hypertension   . Osteoarthritis   . Pre-diabetes   . PREMATURE VENTRICULAR CONTRACTIONS 03/05/2010   Qualifier: Diagnosis of  By: Lovena Le, MD, Martyn Malay   . Pulmonary embolism ([place]) 12/16/2013  . Stroke [facility]) 1997    Assessment: Pharmacy consulted for warfarin dosing and monitoring for 86 yo amel admitted with SOB. Patient found to have right pneumothorax. INR on admission 2.9  DDI: Allopurinol - Patient on both PTA  Home Dose: Warfarin 5mg  daily    DATE INR DOSE 5/31 2.9  Goal of Therapy:  INR 2-3 Monitor platelets by anticoagulation protocol: Yes   Plan:  Patient received cefazolin 1g IV x 1 dose in ED.  INR at the high end of therapeutic.  Will order reduced dose of warfarin 4mg  x 1 dose tonight INR ordered with AM labs.  Pharmacy will continue to follow and dose based on INR.   Pernell Dupre, PharmD, BCPS Clinical Pharmacist 12/10/2019 12:33 PM

## 2019-12-10 NOTE — ED Triage Notes (Signed)
Patient coming [facility] From home for respiratory distress. Diminished lung sounds on left. No lung sounds on right. Patient 70% on RA at EMS arrival. Patient does not normally wear oxygen. Patient given 2 duonebs, 2g mag, and 125 mg solumedrol in transport.

## 2019-12-10 NOTE — Progress Notes (Signed)
*  PRELIMINARY RESULTS* Echocardiogram 2D Echocardiogram has been performed.  Jonette Mate Licia Harl 12/10/2019, 6:58 PM

## 2019-12-10 NOTE — Consult Note (Signed)
Reason for Consult: Air leak from chest tube Referring Physician: Dr. Francine Graven (hospital medicine)  [name] Hardin is an 86 y.o. male.  HPI: He presented to the emergency department early this morning with shortness of breath.  He was found to have a tension pneumothorax.  A chest tube was placed by the emergency department physician.  Upon arrival to the floor, the nurse was concerned that there was an air leak.  I have been consulted by hospital medicine regarding this finding.  [name] Hardin has an extensive smoking history, up to 3 packs a day, for over 40 years.  A CT scan of the chest that was done in 2019 did demonstrate apical emphysema.  He says that his breathing has gotten significantly better since chest tube placement.  Past Medical History:  Diagnosis Date  . ATRIAL FIBRILLATION 03/05/2010   Qualifier: Diagnosis of  By: Lovena Le, MD, Martyn Malay   . Benign hypertension 12/16/2013  . Cancer ([place])    bladder  . Chronic obstructive pulmonary disease ([place]) 12/16/2013  . COPD (chronic obstructive pulmonary disease) ([place])   . Family history of adverse reaction to anesthesia    Brother has a difficult time waking up.  Marland Kitchen Hx pulmonary embolism 08/2000  . Hypercholesterolemia   . Hyperglycemia   . Hypertension   . Osteoarthritis   . Pre-diabetes   . PREMATURE VENTRICULAR CONTRACTIONS 03/05/2010   Qualifier: Diagnosis of  By: Lovena Le, MD, Martyn Malay   . Pulmonary embolism ([place]) 12/16/2013  . Stroke [facility]) 1997    Past Surgical History:  Procedure Laterality Date  . CYSTOSCOPY W/ RETROGRADES Bilateral 05/18/2017   Procedure: CYSTOSCOPY WITH RETROGRADE PYELOGRAM;  Surgeon: Hollice Espy, MD;  Location: [facility] ORS;  Service: Urology;  Laterality: Bilateral;  . CYSTOSCOPY WITH BIOPSY N/A 05/18/2017   Procedure: CYSTOSCOPY WITH BIOPSY;  Surgeon: Hollice Espy, MD;  Location: [facility] ORS;  Service: Urology;  Laterality: N/A;  . JOINT REPLACEMENT Right   . KYPHOPLASTY N/A 10/25/2019    Procedure: L2 KYPHOPLASTY;  Surgeon: Hessie Knows, MD;  Location: [facility] ORS;  Service: Orthopedics;  Laterality: N/A;  . PERIPHERAL VASCULAR CATHETERIZATION N/A 02/04/2015   Procedure: IVC Filter Removal;  Surgeon: Katha Cabal, MD;  Location: [place] [facility];  Service: Cardiovascular;  Laterality: N/A;  . TONSILLECTOMY    . URETEROSCOPY Left 05/18/2017   Procedure: URETEROSCOPY;  Surgeon: Hollice Espy, MD;  Location: [facility] ORS;  Service: Urology;  Laterality: Left;    Family History  Problem Relation Age of Onset  . Prostate cancer Neg Hx   . Kidney cancer Neg Hx     Social History:  reports that he quit smoking about 28 years ago. He has never used smokeless tobacco. He reports that he does not drink alcohol or use drugs.  Allergies: No Known Allergies  Medications: I have reviewed the patient's current medications.  Results for orders placed or performed during the hospital encounter of 12/10/19 (from the past 48 hour(s))  Troponin I (High Sensitivity)     Status: Abnormal   Collection Time: 12/10/19  5:03 AM  Result Value Ref Range   Troponin I (High Sensitivity) 46 (H) <18 ng/L    Comment: (NOTE) Elevated high sensitivity troponin I (hsTnI) values and significant  changes across serial measurements may suggest ACS but many other  chronic and acute conditions are known to elevate hsTnI results.  Refer to the "Links" section for chest pain algorithms and additional  guidance. Performed at [facility],  [place], [place] [address]   Comprehensive metabolic panel     Status: Abnormal   Collection Time: 12/10/19  5:03 AM  Result Value Ref Range   Sodium 140 135 - 145 mmol/L   Potassium 2.9 (L) 3.5 - 5.1 mmol/L   Chloride 102 98 - 111 mmol/L   CO2 25 22 - 32 mmol/L   Glucose, Bld 148 (H) 70 - 99 mg/dL    Comment: Glucose reference range applies only to samples taken after fasting for at least 8 hours.   BUN 25 (H) 8 - 23 mg/dL    Creatinine, Ser 1.34 (H) 0.61 - 1.24 mg/dL   Calcium 8.9 8.9 - 10.3 mg/dL   Total Protein 6.7 6.5 - 8.1 g/dL   Albumin 3.8 3.5 - 5.0 g/dL   AST 29 15 - 41 U/L   ALT 18 0 - 44 U/L   Alkaline Phosphatase 101 38 - 126 U/L   Total Bilirubin 1.1 0.3 - 1.2 mg/dL   GFR calc non Af Amer 48 (L) >60 mL/min   GFR calc Af Amer 56 (L) >60 mL/min   Anion gap 13 5 - 15    Comment: Performed at [facility], [address] [place], [place] [address]  CBC with Differential     Status: Abnormal   Collection Time: 12/10/19  5:03 AM  Result Value Ref Range   WBC 8.4 4.0 - 10.5 K/uL   RBC 3.73 (L) 4.22 - 5.81 MIL/uL   Hemoglobin 11.9 (L) 13.0 - 17.0 g/dL   HCT 37.0 (L) 39.0 - 52.0 %   MCV 99.2 80.0 - 100.0 fL   MCH 31.9 26.0 - 34.0 pg   MCHC 32.2 30.0 - 36.0 g/dL   RDW 15.9 (H) 11.5 - 15.5 %   Platelets 149 (L) 150 - 400 K/uL   nRBC 0.0 0.0 - 0.2 %   Neutrophils Relative % 56 %   Neutro Abs 4.7 1.7 - 7.7 K/uL   Lymphocytes Relative 33 %   Lymphs Abs 2.8 0.7 - 4.0 K/uL   Monocytes Relative 7 %   Monocytes Absolute 0.6 0.1 - 1.0 K/uL   Eosinophils Relative 4 %   Eosinophils Absolute 0.3 0.0 - 0.5 K/uL   Basophils Relative 0 %   Basophils Absolute 0.0 0.0 - 0.1 K/uL   Immature Granulocytes 0 %   Abs Immature Granulocytes 0.02 0.00 - 0.07 K/uL    Comment: Performed at [facility], [address] [place], [place] [address]  Blood gas, venous     Status: None   Collection Time: 12/10/19  5:03 AM  Result Value Ref Range   pH, Ven 7.35 7.250 - 7.430   pCO2, Ven 49 44.0 - 60.0 mmHg   pO2, Ven 42.0 32.0 - 45.0 mmHg   Bicarbonate 27.1 20.0 - 28.0 mmol/L   Acid-Base Excess 0.9 0.0 - 2.0 mmol/L   O2 Saturation 74.6 %   Patient temperature 37.0    Sample type VENOUS     Comment: Performed at [facility], [address] [place], [place] [address]  Protime-INR     Status: Abnormal   Collection Time: 12/10/19  5:03 AM  Result Value Ref Range   Prothrombin Time 29.5  (H) 11.4 - 15.2 seconds   INR 2.9 (H) 0.8 - 1.2    Comment: (NOTE) INR goal varies based on device and disease states. Performed at [facility], [address] [place], [place] [address]  SARS Coronavirus 2 by RT PCR (hospital order, performed in [facility] hospital lab) Nasopharyngeal Nasopharyngeal Swab     Status: None   Collection Time: 12/10/19  5:05 AM   Specimen: Nasopharyngeal Swab  Result Value Ref Range   SARS Coronavirus 2 NEGATIVE NEGATIVE    Comment: (NOTE) SARS-CoV-2 target nucleic acids are NOT DETECTED. The SARS-CoV-2 RNA is generally detectable in upper and lower respiratory specimens during the acute phase of infection. The lowest concentration of SARS-CoV-2 viral copies this assay can detect is 250 copies / mL. A negative result does not preclude SARS-CoV-2 infection and should not be used as the sole basis for treatment or other patient management decisions.  A negative result may occur with improper specimen collection / handling, submission of specimen other than nasopharyngeal swab, presence of viral mutation(s) within the areas targeted by this assay, and inadequate number of viral copies (<250 copies / mL). A negative result must be combined with clinical observations, patient history, and epidemiological information. Fact Sheet for Patients:   StrictlyIdeas.no Fact Sheet for Healthcare Providers: BankingDealers.co.za This test is not yet approved or cleared  by the [place] FDA and has been authorized for detection and/or diagnosis of SARS-CoV-2 by FDA under an Emergency Use Authorization (EUA).  This EUA will remain in effect (meaning this test can be used) for the duration of the COVID-19 declaration under Section 564(b)(1) of the Act, 21 U.S.C. section 360bbb-3(b)(1), unless the authorization is terminated or revoked sooner. Performed at [facility], [address]  [place], [place] [address]   Hemoglobin A1c     Status: Abnormal   Collection Time: 12/10/19  5:05 AM  Result Value Ref Range   Hgb A1c MFr Bld 6.0 (H) 4.8 - 5.6 %    Comment: (NOTE) Pre diabetes:          5.7%-6.4% Diabetes:              >6.4% Glycemic control for   <7.0% adults with diabetes    Mean Plasma Glucose 125.5 mg/dL    Comment: Performed at [facility] [address], [place], [place] [address]  Glucose, capillary     Status: Abnormal   Collection Time: 12/10/19 12:18 PM  Result Value Ref Range   Glucose-Capillary 252 (H) 70 - 99 mg/dL    Comment: Glucose reference range applies only to samples taken after fasting for at least 8 hours.  Troponin I (High Sensitivity)     Status: Abnormal   Collection Time: 12/10/19 12:19 PM  Result Value Ref Range   Troponin I (High Sensitivity) 81 (H) <18 ng/L    Comment: READ BACK AND VERIFIED WITH ANNIE RICHIE 12/10/19 1327 [facility] (NOTE) Elevated high sensitivity troponin I (hsTnI) values and significant  changes across serial measurements may suggest ACS but many other  chronic and acute conditions are known to elevate hsTnI results.  Refer to the "Links" section for chest pain algorithms and additional  guidance. Performed at [facility], [address] [place], [place] [address]     DG Chest Portable 1 View  Result Date: 12/10/2019 CLINICAL DATA:  Follow-up chest tube. EXAM: PORTABLE CHEST 1 VIEW COMPARISON:  Chest radiograph from earlier today. FINDINGS: Right apical chest tube terminates in the medial right apical pleural space. Stable cardiomediastinal silhouette with mild cardiomegaly. No pneumothorax. No pleural effusion. Stable coarsened interstitial opacities in both lungs with mild left basilar atelectasis. IMPRESSION: 1. No pneumothorax. Right apical chest tube in place.  2. Stable mild cardiomegaly. 3. Stable coarsened interstitial opacities in both lungs with mild left basilar atelectasis. Electronically  Signed   By: Ilona Sorrel M.D.   On: 12/10/2019 07:13   DG Chest Portable 1 View  Result Date: 12/10/2019 CLINICAL DATA:  Pneumothorax, chest tube placement EXAM: PORTABLE CHEST 1 VIEW COMPARISON:  Chest radiograph from earlier today. FINDINGS: Right chest tube terminates in apparent extrapleural location along the lateral margin of right fourth rib with surrounding mild lateral right chest wall subcutaneous emphysema. Stable cardiomediastinal silhouette with mild cardiomegaly. No appreciable residual pneumothorax. No pleural effusion. Diffuse interstitial opacities with resolved right lung atelectasis. IMPRESSION: 1. No appreciable residual pneumothorax. Right chest tube terminates in apparent extrapleural location along the lateral margin of the right fourth rib with surrounding mild lateral right chest wall subcutaneous emphysema. 2. Stable mild cardiomegaly with diffuse interstitial opacities, suggesting mild pulmonary edema. These results were called by telephone at the time of interpretation on 12/10/2019 at 6:22 am to provider Kahi Mohala , who verbally acknowledged these results. Electronically Signed   By: Ilona Sorrel M.D.   On: 12/10/2019 06:23   DG Chest Portable 1 View  Result Date: 12/10/2019 CLINICAL DATA:  Dyspnea EXAM: PORTABLE CHEST 1 VIEW COMPARISON:  11/30/2011 chest CT FINDINGS: Mild cardiomegaly. Large right pneumothorax, approximately 60-70 %. No left pneumothorax. Slight left mediastinal shift. Complete right lung atelectasis. Mild interstitial prominence throughout the left lung. IMPRESSION: 1. Large right pneumothorax with slight left mediastinal shift, suggesting tension pneumothorax. Complete right lung atelectasis. 2. Mild cardiomegaly. Mild interstitial prominence throughout the left lung, cannot exclude mild pulmonary edema. Critical Value/emergent results were called by telephone at the time of interpretation on 12/10/2019 at 5:13 am to provider DR. Nancy Fetter, who verbally  acknowledged these results. Electronically Signed   By: Ilona Sorrel M.D.   On: 12/10/2019 05:17    Review of Systems  Constitutional: Negative for chills and fever.  Respiratory: Positive for shortness of breath.   Cardiovascular: Positive for palpitations and leg swelling.  Gastrointestinal: Negative for nausea and vomiting.   Blood pressure (!) 116/54, pulse (!) 104, temperature 98.5 F (36.9 C), temperature source Oral, resp. rate 20, height 5\' 11"  (1.803 m), weight 123.4 kg, SpO2 94 %. Physical Exam  Constitutional: He is oriented to person, place, and time. He appears well-developed and well-nourished. No distress.  HENT:  Head: Normocephalic and atraumatic.  Eyes: Right eye exhibits no discharge. Left eye exhibits no discharge. No scleral icterus.  Neck: No tracheal deviation present.  Cardiovascular: An irregularly irregular rhythm present. Tachycardia present.  Respiratory: No stridor. No respiratory distress.  Chest tube in place.  Moderate air leak present.  GI: Soft. He exhibits no distension.  Genitourinary:    Genitourinary Comments: Deferred   Musculoskeletal:        General: Edema present.  Neurological: He is alert and oriented to person, place, and time.  Skin: Skin is warm and dry.  Psychiatric: He has a normal mood and affect. His behavior is normal.    Assessment/Plan: This is an 86 year old man with an extensive tobacco abuse history.  He presented to the emergency department with a spontaneous pneumothorax that did have a tension component.  A chest tube was placed with resolution of the pneumothorax.  He has an air leak present.  This is not unexpected in the setting of what likely represents a ruptured bleb.  Typically, the air leak will resolve as the site of rupture heals.  This is generally  just a matter of time.  If there is a persistent air leak, however, we may need to consider pleurodesis.  I have ordered a follow-up chest x-ray for tomorrow  morning.  Of note, Dr. Genevive Bi is away this week and therefore any significant cardiothoracic intervention would require transfer to a different facility.  Fredirick Maudlin 12/10/2019, 1:56 PM

## 2019-12-10 NOTE — ED Notes (Signed)
Taken to floor by Mickel Baas, RN. All of belongings including DNR taken with patient.

## 2019-12-10 NOTE — Progress Notes (Signed)
Pt admitted to rm 255. VSS, pt complained of 8/10 pain from chest tube insertion, administered IV morphine. This RN noticed intermittent bubbling in chest tube chamber, charge RN and MD notified. MD stated this was not "unusual" and will have the surgeon come assess pt. Patient not in any visable distress, daughter is at bedside, call bell within reach.

## 2019-12-10 NOTE — ED Notes (Signed)
This RN at bedside while MD veronese advances chest tube

## 2019-12-10 NOTE — ED Notes (Signed)
This RN, Wells Guiles RN, and [facility] representative at bedside for  Chest tube insertion. Patient gave verbal consent for procedure. Right site, right patient, right procedure verified.   Sterile field maintained. Site prepped by MD Alfred Levins. MD inserted chest tube at 05:19.

## 2019-12-11 ENCOUNTER — Inpatient Hospital Stay: Payer: Medicare Other

## 2019-12-11 DIAGNOSIS — I493 Ventricular premature depolarization: Secondary | ICD-10-CM

## 2019-12-11 DIAGNOSIS — J930 Spontaneous tension pneumothorax: Principal | ICD-10-CM

## 2019-12-11 LAB — GLUCOSE, CAPILLARY
Glucose-Capillary: 123 mg/dL — ABNORMAL HIGH (ref 70–99)
Glucose-Capillary: 116 mg/dL — ABNORMAL HIGH (ref 70–99)
Glucose-Capillary: 125 mg/dL — ABNORMAL HIGH (ref 70–99)
Glucose-Capillary: 114 mg/dL — ABNORMAL HIGH (ref 70–99)

## 2019-12-11 LAB — BASIC METABOLIC PANEL
Anion gap: 11 (ref 5–15)
BUN: 28 mg/dL — ABNORMAL HIGH (ref 8–23)
CO2: 29 mmol/L (ref 22–32)
Calcium: 8.9 mg/dL (ref 8.9–10.3)
Chloride: 98 mmol/L (ref 98–111)
Creatinine, Ser: 1.33 mg/dL — ABNORMAL HIGH (ref 0.61–1.24)
GFR calc Af Amer: 56 mL/min — ABNORMAL LOW (ref >60)
GFR calc non Af Amer: 48 mL/min — ABNORMAL LOW (ref >60)
Glucose, Bld: 120 mg/dL — ABNORMAL HIGH (ref 70–99)
Potassium: 4.6 mmol/L (ref 3.5–5.1)
Sodium: 138 mmol/L (ref 135–145)

## 2019-12-11 LAB — ECHOCARDIOGRAM COMPLETE
Height: 71 in
Weight: 4351.99 oz

## 2019-12-11 LAB — PROTIME-INR
INR: 3.4 — ABNORMAL HIGH (ref 0.8–1.2)
Prothrombin Time: 32.9 seconds — ABNORMAL HIGH (ref 11.4–15.2)

## 2019-12-11 LAB — MAGNESIUM: Magnesium: 2.4 mg/dL (ref 1.7–2.4)

## 2019-12-11 MED ORDER — FUROSEMIDE 10 MG/ML IJ SOLN
40.0000 mg | Freq: Once | INTRAMUSCULAR | Status: AC
  Administered 2019-12-11: 40 mg via INTRAVENOUS
  Filled 2019-12-11: qty 4

## 2019-12-11 MED ORDER — FUROSEMIDE 10 MG/ML IJ SOLN: 80.0000 mg | Freq: Two times a day (BID) | INTRAMUSCULAR | Status: DC

## 2019-12-11 MED ORDER — FUROSEMIDE 10 MG/ML IJ SOLN: 60.0000 mg | Freq: Two times a day (BID) | INTRAMUSCULAR | Status: DC

## 2019-12-11 MED ORDER — FUROSEMIDE 10 MG/ML IJ SOLN
80.0000 mg | Freq: Two times a day (BID) | INTRAMUSCULAR | Status: DC
  Administered 2019-12-11 – 2019-12-13 (×5): 80 mg via INTRAVENOUS
  Filled 2019-12-11 (×5): qty 8

## 2019-12-11 MED ORDER — POLYETHYLENE GLYCOL 3350 17 G PO PACK
17.0000 g | PACK | Freq: Every day | ORAL | Status: DC
  Administered 2019-12-11 – 2019-12-12 (×2): 17 g via ORAL
  Filled 2019-12-11 (×3): qty 1

## 2019-12-11 NOTE — Consult Note (Signed)
[facility]  CARDIOLOGY CONSULT NOTE  Patient ID: [name] Hardin MRN: [id number] DOB/AGE: 84/03/1932 84 y.o.  Admit date: 12/10/2019 Referring Physician [place] Primary Physician [facility] Primary Cardiologist  Reason for Consultation congestive heart failure  HPI: 86 year old gentleman referred for evaluation of congestive heart failure and atrial fibrillation.  The patient presented to [facility] 12/10/2019 with new onset shortness of breath.  X-ray revealed right pneumothorax with hypoxia.  Chest tube was placed with resolution of pneumothorax.  Patient has 1 month history of worsening peripheral edema on outpatient diuretics.  The echocardiogram was performed which revealed preserved left ventricular function, with LVEF of 45 to 50% with moderate right ventricular enlargement.  Patient has a history of atrial flutter on Coumadin.  He currently is in atrial fibrillation at a rate of 100 bpm on Cardizem CD.  Patient denies chest pain, palpitations and notes improved breathing with less peripheral edema following IV furosemide.  Review of systems complete and found to be negative unless listed above     Past Medical History:  Diagnosis Date  . ATRIAL FIBRILLATION 03/05/2010   Qualifier: Diagnosis of  By: Lovena Le, MD, Martyn Malay   . Benign hypertension 12/16/2013  . Cancer ([place])    bladder  . Chronic obstructive pulmonary disease ([place]) 12/16/2013  . COPD (chronic obstructive pulmonary disease) ([place])   . Family history of adverse reaction to anesthesia    Brother has a difficult time waking up.  Marland Kitchen Hx pulmonary embolism 08/2000  . Hypercholesterolemia   . Hyperglycemia   . Hypertension   . Osteoarthritis   . Pre-diabetes   . PREMATURE VENTRICULAR CONTRACTIONS 03/05/2010   Qualifier: Diagnosis of  By: Lovena Le, MD, Martyn Malay   . Pulmonary embolism ([place]) 12/16/2013  . Stroke [facility]) 1997    Past Surgical History:  Procedure Laterality Date  . CYSTOSCOPY W/ RETROGRADES Bilateral  05/18/2017   Procedure: CYSTOSCOPY WITH RETROGRADE PYELOGRAM;  Surgeon: Hollice Espy, MD;  Location: [facility] ORS;  Service: Urology;  Laterality: Bilateral;  . CYSTOSCOPY WITH BIOPSY N/A 05/18/2017   Procedure: CYSTOSCOPY WITH BIOPSY;  Surgeon: Hollice Espy, MD;  Location: [facility] ORS;  Service: Urology;  Laterality: N/A;  . JOINT REPLACEMENT Right   . KYPHOPLASTY N/A 10/25/2019   Procedure: L2 KYPHOPLASTY;  Surgeon: Hessie Knows, MD;  Location: [facility] ORS;  Service: Orthopedics;  Laterality: N/A;  . PERIPHERAL VASCULAR CATHETERIZATION N/A 02/04/2015   Procedure: IVC Filter Removal;  Surgeon: Katha Cabal, MD;  Location: [place] CV LAB;  Service: Cardiovascular;  Laterality: N/A;  . TONSILLECTOMY    . URETEROSCOPY Left 05/18/2017   Procedure: URETEROSCOPY;  Surgeon: Hollice Espy, MD;  Location: [facility] ORS;  Service: Urology;  Laterality: Left;    Medications Prior to Admission  Medication Sig Dispense Refill Last Dose  . allopurinol (ZYLOPRIM) 300 MG tablet Take 300 mg by mouth daily. In am   12/09/2019 at 0900  . aspirin EC 81 MG tablet Take 81 mg by mouth daily.   12/09/2019 at 0900  . colchicine (COLCRYS) 0.6 MG tablet Take 0.6 mg by mouth every other day.    12/09/2019 at 0900  . diltiazem (CARDIZEM CD) 240 MG 24 hr capsule Take 240 mg by mouth daily. In am   12/09/2019 at 0900  . Fluticasone-Salmeterol (ADVAIR DISKUS) 250-50 MCG/DOSE AEPB Inhale 1 puff into the lungs 2 (two) times daily.     12/09/2019 at 2000  . lactulose (CHRONULAC) 10 GM/15ML solution Take 15 mLs by mouth 2 (two) times daily  as needed for constipation.   12/09/2019 at 2000  . Multiple Vitamin (MULTIVITAMIN WITH MINERALS) TABS tablet Take 1 tablet by mouth daily.   12/09/2019 at 0900  . simvastatin (ZOCOR) 40 MG tablet Take 40 mg by mouth daily at 6 PM.    12/09/2019 at 1800  . tiotropium (SPIRIVA HANDIHALER) 18 MCG inhalation capsule Place 18 mcg into inhaler and inhale daily. In am   12/09/2019 at 0900  . torsemide  (DEMADEX) 100 MG tablet Take 100 mg by mouth daily.   12/09/2019 at 0900  . warfarin (COUMADIN) 5 MG tablet Take 5 mg by mouth daily at 6 PM.    12/09/2019 at 1800  . potassium chloride (KLOR-CON) 10 MEQ CR tablet Take 10 mEq by mouth daily. In am   Not Taking at Unknown time   Social History   Socioeconomic History  . Marital status: Married    Spouse name: Not on file  . Number of children: Not on file  . Years of education: Not on file  . Highest education level: Not on file  Occupational History  . Not on file  Tobacco Use  . Smoking status: Former Smoker    Quit date: 05/06/1991    Years since quitting: 28.6  . Smokeless tobacco: Never Used  Substance and Sexual Activity  . Alcohol use: No    Alcohol/week: 0.0 standard drinks  . Drug use: No  . Sexual activity: Not on file  Other Topics Concern  . Not on file  Social History Narrative  . Not on file   Social Determinants of Health   Financial Resource Strain:   . Difficulty of Paying Living Expenses:   Food Insecurity:   . Worried About Charity fundraiser in the Last Year:   . Arboriculturist in the Last Year:   Transportation Needs:   . Film/video editor (Medical):   Marland Kitchen Lack of Transportation (Non-Medical):   Physical Activity:   . Days of Exercise per Week:   . Minutes of Exercise per Session:   Stress:   . Feeling of Stress :   Social Connections:   . Frequency of Communication with Friends and Family:   . Frequency of Social Gatherings with Friends and Family:   . Attends Religious Services:   . Active Member of Clubs or Organizations:   . Attends Archivist Meetings:   Marland Kitchen Marital Status:   Intimate Partner Violence:   . Fear of Current or Ex-Partner:   . Emotionally Abused:   Marland Kitchen Physically Abused:   . Sexually Abused:     Family History  Problem Relation Age of Onset  . Prostate cancer Neg Hx   . Kidney cancer Neg Hx       Review of systems complete and found to be negative unless  listed above      PHYSICAL EXAM  General: Well developed, well nourished, in no acute distress HEENT:  Normocephalic and atramatic Neck:  No JVD.  Lungs: Clear bilaterally to auscultation and percussion. Heart: HRRR . Normal S1 and S2 without gallops or murmurs.  Abdomen: Bowel sounds are positive, abdomen soft and non-tender  Msk:  Back normal, normal gait. Normal strength and tone for age. Extremities: No clubbing, cyanosis or edema.   Neuro: Alert and oriented X 3. Psych:  Good affect, responds appropriately  Labs:   Lab Results  Component Value Date   WBC 8.4 12/10/2019   HGB 11.9 (L) 12/10/2019   HCT  37.0 (L) 12/10/2019   MCV 99.2 12/10/2019   PLT 149 (L) 12/10/2019    Recent Labs  Lab 12/10/19 0503 12/10/19 0503 12/11/19 0454  NA 140   < > 138  K 2.9*   < > 4.6  CL 102   < > 98  CO2 25   < > 29  BUN 25*   < > 28*  CREATININE 1.34*   < > 1.33*  CALCIUM 8.9   < > 8.9  PROT 6.7  --   --   BILITOT 1.1  --   --   ALKPHOS 101  --   --   ALT 18  --   --   AST 29  --   --   GLUCOSE 148*   < > 120*   < > = values in this interval not displayed.   No results found for: CKTOTAL, CKMB, CKMBINDEX, TROPONINI No results found for: CHOL No results found for: HDL No results found for: LDLCALC No results found for: TRIG No results found for: CHOLHDL No results found for: LDLDIRECT    Radiology: DG CHEST PORT 1 VIEW  Result Date: 12/11/2019 CLINICAL DATA:  Pneumothorax EXAM: PORTABLE CHEST 1 VIEW COMPARISON:  12/10/2019 FINDINGS: The heart size and mediastinal contours are unchanged with cardiomegaly. Aortic knob calcifications. There is prominence of the central pulmonary vasculature. No right-sided pneumothorax is seen. Overlying subcutaneous emphysema is noted. IMPRESSION: Mild pulmonary vascular congestion.  No residual pneumothorax. Electronically Signed   By: Prudencio Pair M.D.   On: 12/11/2019 05:39   DG Chest Portable 1 View  Result Date: 12/10/2019 CLINICAL  DATA:  Follow-up chest tube. EXAM: PORTABLE CHEST 1 VIEW COMPARISON:  Chest radiograph from earlier today. FINDINGS: Right apical chest tube terminates in the medial right apical pleural space. Stable cardiomediastinal silhouette with mild cardiomegaly. No pneumothorax. No pleural effusion. Stable coarsened interstitial opacities in both lungs with mild left basilar atelectasis. IMPRESSION: 1. No pneumothorax. Right apical chest tube in place. 2. Stable mild cardiomegaly. 3. Stable coarsened interstitial opacities in both lungs with mild left basilar atelectasis. Electronically Signed   By: Ilona Sorrel M.D.   On: 12/10/2019 07:13   DG Chest Portable 1 View  Result Date: 12/10/2019 CLINICAL DATA:  Pneumothorax, chest tube placement EXAM: PORTABLE CHEST 1 VIEW COMPARISON:  Chest radiograph from earlier today. FINDINGS: Right chest tube terminates in apparent extrapleural location along the lateral margin of right fourth rib with surrounding mild lateral right chest wall subcutaneous emphysema. Stable cardiomediastinal silhouette with mild cardiomegaly. No appreciable residual pneumothorax. No pleural effusion. Diffuse interstitial opacities with resolved right lung atelectasis. IMPRESSION: 1. No appreciable residual pneumothorax. Right chest tube terminates in apparent extrapleural location along the lateral margin of the right fourth rib with surrounding mild lateral right chest wall subcutaneous emphysema. 2. Stable mild cardiomegaly with diffuse interstitial opacities, suggesting mild pulmonary edema. These results were called by telephone at the time of interpretation on 12/10/2019 at 6:22 am to provider [facility] , who verbally acknowledged these results. Electronically Signed   By: Ilona Sorrel M.D.   On: 12/10/2019 06:23   DG Chest Portable 1 View  Result Date: 12/10/2019 CLINICAL DATA:  Dyspnea EXAM: PORTABLE CHEST 1 VIEW COMPARISON:  11/30/2011 chest CT FINDINGS: Mild cardiomegaly. Large right  pneumothorax, approximately 60-70 %. No left pneumothorax. Slight left mediastinal shift. Complete right lung atelectasis. Mild interstitial prominence throughout the left lung. IMPRESSION: 1. Large right pneumothorax with slight left mediastinal shift, suggesting tension  pneumothorax. Complete right lung atelectasis. 2. Mild cardiomegaly. Mild interstitial prominence throughout the left lung, cannot exclude mild pulmonary edema. Critical Value/emergent results were called by telephone at the time of interpretation on 12/10/2019 at 5:13 am to provider DR. Nancy Fetter, who verbally acknowledged these results. Electronically Signed   By: Ilona Sorrel M.D.   On: 12/10/2019 05:17   ECHOCARDIOGRAM COMPLETE  Result Date: 12/11/2019    ECHOCARDIOGRAM REPORT   Patient Name:   [name] Hardin [facility] Date of Exam: 12/10/2019 Medical Rec #:  [id number]        Height:       71.0 in Accession #:    [id number]       Weight:       272.0 lb Date of Birth:  [birth year]        BSA:          2.403 m Patient Age:    8 years         BP:           113/58 mmHg Patient Gender: M                HR:           88 bpm. Exam Location:  [facility] Procedure: 2D Echo, Cardiac Doppler and Color Doppler Indications:     Congestive Heart Failure 428.0 / I50.9  History:         Patient has no prior history of Echocardiogram examinations.                  Stroke, Arrythmias:Atrial Fibrillation; Risk                  Factors:Hypertension.  Sonographer:     Alyse Low Roar Referring Phys:  [id number] AGBATA Diagnosing Phys: Nelva Bush MD IMPRESSIONS  1. Left ventricular ejection fraction, by estimation, is 45 to 50%. The left ventricle has mildly decreased function. Left ventricular endocardial border not optimally defined to evaluate regional wall motion. There is mild left ventricular hypertrophy.  Left ventricular diastolic parameters are indeterminate.  2. Right ventricular systolic function is normal. The right ventricular size is moderately enlarged. There is  moderately elevated pulmonary artery systolic pressure.  3. Left atrial size was severely dilated.  4. Right atrial size was moderately dilated.  5. The mitral valve is degenerative. Moderate mitral valve regurgitation. No evidence of mitral stenosis.  6. Tricuspid valve regurgitation is moderate to severe.  7. The aortic valve has an indeterminant number of cusps. Aortic valve regurgitation is not visualized. Mild aortic valve sclerosis is present, with no evidence of aortic valve stenosis. FINDINGS  Left Ventricle: Left ventricular ejection fraction, by estimation, is 45 to 50%. The left ventricle has mildly decreased function. Left ventricular endocardial border not optimally defined to evaluate regional wall motion. The left ventricular internal cavity size was normal in size. There is mild left ventricular hypertrophy. Left ventricular diastolic parameters are indeterminate. Right Ventricle: The right ventricular size is moderately enlarged. Right vetricular wall thickness was not assessed. Right ventricular systolic function is normal. There is moderately elevated pulmonary artery systolic pressure. The tricuspid regurgitant velocity is 2.96 m/s, and with an assumed right atrial pressure of 10 mmHg, the estimated right ventricular systolic pressure is 0000000 mmHg. Left Atrium: Left atrial size was severely dilated. Right Atrium: Right atrial size was moderately dilated. Pericardium: A small pericardial effusion is present. Mitral Valve: The mitral valve is degenerative in appearance. There is mild thickening of the mitral  valve leaflet(s). Mild mitral annular calcification. Moderate mitral valve regurgitation, with eccentric posteriorly directed jet. No evidence of mitral valve stenosis. Tricuspid Valve: The tricuspid valve is normal in structure. Tricuspid valve regurgitation is moderate to severe. Aortic Valve: The aortic valve has an indeterminant number of cusps. . There is mild thickening and mild  calcification of the aortic valve. Aortic valve regurgitation is not visualized. Mild aortic valve sclerosis is present, with no evidence of aortic valve stenosis. There is mild thickening of the aortic valve. There is mild calcification of the aortic valve. Aortic valve mean gradient measures 4.0 mmHg. Aortic valve peak gradient measures 8.4 mmHg. Aortic valve area, by VTI measures 1.42 cm. Pulmonic Valve: The pulmonic valve was not well visualized. Pulmonic valve regurgitation is trivial. No evidence of pulmonic stenosis. Aorta: The aortic root is normal in size and structure. Pulmonary Artery: The pulmonary artery is of normal size. IAS/Shunts: The interatrial septum was not well visualized.  LEFT VENTRICLE PLAX 2D LVIDd:         5.46 cm  Diastology LVIDs:         4.07 cm  LV e' lateral:   14.90 cm/s LV PW:         1.18 cm  LV E/e' lateral: 8.3 LV IVS:        1.23 cm  LV e' medial:    8.70 cm/s LVOT diam:     1.70 cm  LV E/e' medial:  14.3 LV SV:         31 LV SV Index:   13 LVOT Area:     2.27 cm  RIGHT VENTRICLE RV Mid diam:    4.86 cm RV S prime:     20.00 cm/s TAPSE (M-mode): 2.0 cm LEFT ATRIUM              Index       RIGHT ATRIUM           Index LA diam:        5.20 cm  2.16 cm/m  RA Area:     26.40 cm LA Vol (A2C):   146.0 ml 60.76 ml/m RA Volume:   87.30 ml  36.33 ml/m LA Vol (A4C):   169.0 ml 70.33 ml/m LA Biplane Vol: 163.0 ml 67.83 ml/m  AORTIC VALVE                   PULMONIC VALVE AV Area (Vmax):    1.29 cm    PV Vmax:        0.90 m/s AV Area (Vmean):   1.27 cm    PV Peak grad:   3.2 mmHg AV Area (VTI):     1.42 cm    RVOT Peak grad: 2 mmHg AV Vmax:           145.00 cm/s AV Vmean:          95.300 cm/s AV VTI:            0.216 m AV Peak Grad:      8.4 mmHg AV Mean Grad:      4.0 mmHg LVOT Vmax:         82.50 cm/s LVOT Vmean:        53.400 cm/s LVOT VTI:          0.135 m LVOT/AV VTI ratio: 0.62  AORTA Ao Root diam: 3.10 cm MITRAL VALVE                TRICUSPID VALVE  MV Area (PHT): 4.86 cm      TR Peak grad:   35.0 mmHg MV Decel Time: 156 msec     TR Vmax:        296.00 cm/s MV E velocity: 124.00 cm/s                             SHUNTS                             Systemic VTI:  0.14 m                             Systemic Diam: 1.70 cm Nelva Bush MD Electronically signed by Nelva Bush MD Signature Date/Time: 12/11/2019/12:39:22 PM    Final     EKG: Atrial fibrillation with right bundle branch block at a rate of 127 bpm  ASSESSMENT AND PLAN:   1.  Acute on chronic diastolic congestive heart failure, exacerbated by right pneumothorax, responding to intravenous furosemide with good diuresis, with improving peripheral edema, the EF 45 to 50% by 2D echocardiogram 2.  Atrial fibrillation, with rapid ventricular rate, heart rate improved after chest tube, currently 100 bpm on current dose of Cardizem CD 3.  Right pneumothorax, status post chest tube, with air leak, followed by surgery 4.  CKD 5.  COPD  Recommendations  1.  Agree with overall current therapy 2.  Continue diuresis 3.  Carefully monitor renal status 4.  Continue Cardizem CD 240 mg daily 5.  Resume warfarin after removal of chest tube and resolution of pneumothorax  Signed: Isaias Cowman MD,PhD, [facility] 12/11/2019, 4:47 PM

## 2019-12-11 NOTE — Consult Note (Signed)
[place] for Warfarin  Indication: atrial fibrillation  No Known Allergies  Patient Measurements: Height: 5\' 11"  (180.3 cm) Weight: 123.9 kg (273 lb 1.6 oz) IBW/kg (Calculated) : 75.3  Vital Signs: Temp: 98 F (36.7 C) (06/01 0747) BP: 103/58 (06/01 0747) Pulse Rate: 100 (06/01 0747)  Labs: Recent Labs    12/10/19 0503 12/10/19 1219 12/11/19 0454  HGB 11.9*  --   --   HCT 37.0*  --   --   PLT 149*  --   --   LABPROT 29.5*  --  32.9*  INR 2.9*  --  3.4*  CREATININE 1.34*  --   --   TROPONINIHS 46* 81*  --     Estimated Creatinine Clearance: 54 mL/min (A) (by C-G formula based on SCr of 1.34 mg/dL (H)).   Medical History: Past Medical History:  Diagnosis Date  . ATRIAL FIBRILLATION 03/05/2010   Qualifier: Diagnosis of  By: Lovena Le, MD, Martyn Malay   . Benign hypertension 12/16/2013  . Cancer ([place])    bladder  . Chronic obstructive pulmonary disease ([place]) 12/16/2013  . COPD (chronic obstructive pulmonary disease) ([place])   . Family history of adverse reaction to anesthesia    Brother has a difficult time waking up.  Marland Kitchen Hx pulmonary embolism 08/2000  . Hypercholesterolemia   . Hyperglycemia   . Hypertension   . Osteoarthritis   . Pre-diabetes   . PREMATURE VENTRICULAR CONTRACTIONS 03/05/2010   Qualifier: Diagnosis of  By: Lovena Le, MD, Martyn Malay   . Pulmonary embolism ([place]) 12/16/2013  . Stroke [facility]) 1997    Assessment: Pharmacy consulted for warfarin dosing and monitoring for 86 yo amel admitted with SOB. Patient found to have right pneumothorax. INR on admission 2.9  DDI: Allopurinol - Patient on both PTA  Home Dose: Warfarin 5mg  daily    DATE INR DOSE 5/31 2.9 4 mg  6/1 3.4 HOLD  Goal of Therapy:  INR 2-3 Monitor platelets by anticoagulation protocol: Yes   Plan:  INR supratherapeutic. Will hold warfarin tonight. Daily INR ordered. CBC every 3 days at least.  .  Pharmacy will continue to follow and  dose based on INR.   Oswald Hillock, PharmD, BCPS Clinical Pharmacist 12/11/2019 8:00 AM

## 2019-12-11 NOTE — Consult Note (Signed)
Reason for Consult: Air leak from chest tube Referring Physician: Dr. Francine Graven (hospital medicine)  [name] Hardin is an 86 y.o. male.  HPI: He presented to the emergency department early this morning with shortness of breath.  He was found to have a tension pneumothorax.  A chest tube was placed by the emergency department physician.  Upon arrival to the floor, the nurse was concerned that there was an air leak.  I have been consulted by hospital medicine regarding this finding.  [name] Hardin has an extensive smoking history, up to 3 packs a day, for over 40 years.  A CT scan of the chest that was done in 2019 did demonstrate apical emphysema.  He says that his breathing has gotten significantly better since chest tube placement.  Interval events: No acute interval events.  Chest x-ray obtained this morning that continues to demonstrate resolution of the right-sided pneumothorax.  Past Medical History:  Diagnosis Date  . ATRIAL FIBRILLATION 03/05/2010   Qualifier: Diagnosis of  By: Lovena Le, MD, Martyn Malay   . Benign hypertension 12/16/2013  . Cancer ([place])    bladder  . Chronic obstructive pulmonary disease ([place]) 12/16/2013  . COPD (chronic obstructive pulmonary disease) ([place])   . Family history of adverse reaction to anesthesia    Brother has a difficult time waking up.  Marland Kitchen Hx pulmonary embolism 08/2000  . Hypercholesterolemia   . Hyperglycemia   . Hypertension   . Osteoarthritis   . Pre-diabetes   . PREMATURE VENTRICULAR CONTRACTIONS 03/05/2010   Qualifier: Diagnosis of  By: Lovena Le, MD, Martyn Malay   . Pulmonary embolism ([place]) 12/16/2013  . Stroke [facility]) 1997    Past Surgical History:  Procedure Laterality Date  . CYSTOSCOPY W/ RETROGRADES Bilateral 05/18/2017   Procedure: CYSTOSCOPY WITH RETROGRADE PYELOGRAM;  Surgeon: Hollice Espy, MD;  Location: [facility] OR[facility];  Service: Urology;  Laterality: Bilateral;  . CYSTOSCOPY WITH BIOPSY N/A 05/18/2017   Procedure: CYSTOSCOPY WITH  BIOPSY;  Surgeon: Hollice Espy, MD;  Location: [facility] OR[facility];  Service: Urology;  Laterality: N/A;  . JOINT REPLACEMENT Right   . KYPHOPLASTY N/A 10/25/2019   Procedure: L2 KYPHOPLASTY;  Surgeon: Hessie Knows, MD;  Location: [facility];  Service: Orthopedics;  Laterality: N/A;  . PERIPHERAL VASCULAR CATHETERIZATION N/A 02/04/2015   Procedure: IVC Filter Removal;  Surgeon: Katha Cabal, MD;  Location: [facility];  Service: Cardiovascular;  Laterality: N/A;  . TONSILLECTOMY    . URETEROSCOPY Left 05/18/2017   Procedure: URETEROSCOPY;  Surgeon: Hollice Espy, MD;  Location: [facility];  Service: Urology;  Laterality: Left;    Family History  Problem Relation Age of Onset  . Prostate cancer Neg Hx   . Kidney cancer Neg Hx     Social History:  reports that he quit smoking about 28 years ago. He has never used smokeless tobacco. He reports that he does not drink alcohol or use drugs.  Allergies: No Known Allergies  Medications: I have reviewed the patient's current medications.  Results for orders placed or performed during the hospital encounter of 12/10/19 (from the past 48 hour(s))  Troponin I (High Sensitivity)     Status: Abnormal   Collection Time: 12/10/19  5:03 AM  Result Value Ref Range   Troponin I (High Sensitivity) 46 (H) <18 ng/L    Comment: (NOTE) Elevated high sensitivity troponin I (hsTnI) values and significant  changes across serial measurements may suggest ACS but many other  chronic and acute conditions are known to  elevate hsTnI results.  Refer to the "Links" section for chest pain algorithms and additional  guidance. Performed at [facility], [address] [place], [place] [address]   Comprehensive metabolic panel     Status: Abnormal   Collection Time: 12/10/19  5:03 AM  Result Value Ref Range   Sodium 140 135 - 145 mmol/L   Potassium 2.9 (L) 3.5 - 5.1 mmol/L   Chloride 102 98 - 111 mmol/L   CO2 25 22 - 32 mmol/L   Glucose, Bld 148  (H) 70 - 99 mg/dL    Comment: Glucose reference range applies only to samples taken after fasting for at least 8 hours.   BUN 25 (H) 8 - 23 mg/dL   Creatinine, Ser 1.34 (H) 0.61 - 1.24 mg/dL   Calcium 8.9 8.9 - 10.3 mg/dL   Total Protein 6.7 6.5 - 8.1 g/dL   Albumin 3.8 3.5 - 5.0 g/dL   AST 29 15 - 41 U/L   ALT 18 0 - 44 U/L   Alkaline Phosphatase 101 38 - 126 U/L   Total Bilirubin 1.1 0.3 - 1.2 mg/dL   GFR calc non Af Amer 48 (L) >60 mL/min   GFR calc Af Amer 56 (L) >60 mL/min   Anion gap 13 5 - 15    Comment: Performed at [facility], [address] [place], [place] [address]  CBC with Differential     Status: Abnormal   Collection Time: 12/10/19  5:03 AM  Result Value Ref Range   WBC 8.4 4.0 - 10.5 K/uL   RBC 3.73 (L) 4.22 - 5.81 MIL/uL   Hemoglobin 11.9 (L) 13.0 - 17.0 g/dL   HCT 37.0 (L) 39.0 - 52.0 %   MCV 99.2 80.0 - 100.0 fL   MCH 31.9 26.0 - 34.0 pg   MCHC 32.2 30.0 - 36.0 g/dL   RDW 15.9 (H) 11.5 - 15.5 %   Platelets 149 (L) 150 - 400 K/uL   nRBC 0.0 0.0 - 0.2 %   Neutrophils Relative % 56 %   Neutro Abs 4.7 1.7 - 7.7 K/uL   Lymphocytes Relative 33 %   Lymphs Abs 2.8 0.7 - 4.0 K/uL   Monocytes Relative 7 %   Monocytes Absolute 0.6 0.1 - 1.0 K/uL   Eosinophils Relative 4 %   Eosinophils Absolute 0.3 0.0 - 0.5 K/uL   Basophils Relative 0 %   Basophils Absolute 0.0 0.0 - 0.1 K/uL   Immature Granulocytes 0 %   Abs Immature Granulocytes 0.02 0.00 - 0.07 K/uL    Comment: Performed at [facility], [address] [place], [place] [address]  Blood gas, venous     Status: None   Collection Time: 12/10/19  5:03 AM  Result Value Ref Range   pH, Ven 7.35 7.250 - 7.430   pCO2, Ven 49 44.0 - 60.0 mmHg   pO2, Ven 42.0 32.0 - 45.0 mmHg   Bicarbonate 27.1 20.0 - 28.0 mmol/L   Acid-Base Excess 0.9 0.0 - 2.0 mmol/L   O2 Saturation 74.6 %   Patient temperature 37.0    Sample type VENOUS     Comment: Performed at [facility], [address] [place],  [address]  Protime-INR     Status: Abnormal   Collection Time: 12/10/19  5:03 AM  Result Value Ref Range   Prothrombin Time 29.5 (H) 11.4 - 15.2 seconds   INR 2.9 (H) 0.8 - 1.2    Comment: (NOTE)  INR goal varies based on device and disease states. Performed at [facility], [address] [place], [place] [address]   SARS Coronavirus 2 by RT PCR (hospital order, performed in [facility] hospital lab) Nasopharyngeal Nasopharyngeal Swab     Status: None   Collection Time: 12/10/19  5:05 AM   Specimen: Nasopharyngeal Swab  Result Value Ref Range   SARS Coronavirus 2 NEGATIVE NEGATIVE    Comment: (NOTE) SARS-CoV-2 target nucleic acids are NOT DETECTED. The SARS-CoV-2 RNA is generally detectable in upper and lower respiratory specimens during the acute phase of infection. The lowest concentration of SARS-CoV-2 viral copies this assay can detect is 250 copies / mL. A negative result does not preclude SARS-CoV-2 infection and should not be used as the sole basis for treatment or other patient management decisions.  A negative result may occur with improper specimen collection / handling, submission of specimen other than nasopharyngeal swab, presence of viral mutation(s) within the areas targeted by this assay, and inadequate number of viral copies (<250 copies / mL). A negative result must be combined with clinical observations, patient history, and epidemiological information. Fact Sheet for Patients:   StrictlyIdeas.no Fact Sheet for Healthcare Providers: BankingDealers.co.za This test is not yet approved or cleared  by the [place] FDA and has been authorized for detection and/or diagnosis of SARS-CoV-2 by FDA under an Emergency Use Authorization (EUA).  This EUA will remain in effect (meaning this test can be used) for the duration of the COVID-19 declaration under Section 564(b)(1) of the Act, 21  U.S.C. section 360bbb-3(b)(1), unless the authorization is terminated or revoked sooner. Performed at [facility], [address] [place], [place] [address]   Hemoglobin A1c     Status: Abnormal   Collection Time: 12/10/19  5:05 AM  Result Value Ref Range   Hgb A1c MFr Bld 6.0 (H) 4.8 - 5.6 %    Comment: (NOTE) Pre diabetes:          5.7%-6.4% Diabetes:              >6.4% Glycemic control for   <7.0% adults with diabetes    Mean Plasma Glucose 125.5 mg/dL    Comment: Performed at [facility] [address], [place], [place] [address]  Glucose, capillary     Status: Abnormal   Collection Time: 12/10/19 12:18 PM  Result Value Ref Range   Glucose-Capillary 252 (H) 70 - 99 mg/dL    Comment: Glucose reference range applies only to samples taken after fasting for at least 8 hours.  Troponin I (High Sensitivity)     Status: Abnormal   Collection Time: 12/10/19 12:19 PM  Result Value Ref Range   Troponin I (High Sensitivity) 81 (H) <18 ng/L    Comment: READ BACK AND VERIFIED WITH ANNIE RICHIE 12/10/19 1327 [facility] (NOTE) Elevated high sensitivity troponin I (hsTnI) values and significant  changes across serial measurements may suggest ACS but many other  chronic and acute conditions are known to elevate hsTnI results.  Refer to the "Links" section for chest pain algorithms and additional  guidance. Performed at [facility], [address] [place], [place] [address]   Glucose, capillary     Status: Abnormal   Collection Time: 12/10/19  3:49 PM  Result Value Ref Range   Glucose-Capillary 157 (H) 70 - 99 mg/dL    Comment: Glucose reference range applies only to samples taken after fasting for at least 8 hours.  Glucose, capillary  Status: Abnormal   Collection Time: 12/10/19  8:57 PM  Result Value Ref Range   Glucose-Capillary 110 (H) 70 - 99 mg/dL    Comment: Glucose reference range applies only to samples taken after fasting for at least 8 hours.   Protime-INR     Status: Abnormal   Collection Time: 12/11/19  4:54 AM  Result Value Ref Range   Prothrombin Time 32.9 (H) 11.4 - 15.2 seconds   INR 3.4 (H) 0.8 - 1.2    Comment: (NOTE) INR goal varies based on device and disease states. Performed at [facility], [address] [place], [place] [id number]   Basic metabolic panel     Status: Abnormal   Collection Time: 12/11/19  4:54 AM  Result Value Ref Range   Sodium 138 135 - 145 mmol/L   Potassium 4.6 3.5 - 5.1 mmol/L   Chloride 98 98 - 111 mmol/L   CO2 29 22 - 32 mmol/L   Glucose, Bld 120 (H) 70 - 99 mg/dL    Comment: Glucose reference range applies only to samples taken after fasting for at least 8 hours.   BUN 28 (H) 8 - 23 mg/dL   Creatinine, Ser 1.33 (H) 0.61 - 1.24 mg/dL   Calcium 8.9 8.9 - 10.3 mg/dL   GFR calc non Af Amer 48 (L) >60 mL/min   GFR calc Af Amer 56 (L) >60 mL/min   Anion gap 11 5 - 15    Comment: Performed at [facility], [address] [place], [place] [address]  Magnesium     Status: None   Collection Time: 12/11/19  4:54 AM  Result Value Ref Range   Magnesium 2.4 1.7 - 2.4 mg/dL    Comment: Performed at [facility], [address] [place],  [address]  Glucose, capillary     Status: Abnormal   Collection Time: 12/11/19  7:49 AM  Result Value Ref Range   Glucose-Capillary 123 (H) 70 - 99 mg/dL    Comment: Glucose reference range applies only to samples taken after fasting for at least 8 hours.  Glucose, capillary     Status: Abnormal   Collection Time: 12/11/19 11:22 AM  Result Value Ref Range   Glucose-Capillary 116 (H) 70 - 99 mg/dL    Comment: Glucose reference range applies only to samples taken after fasting for at least 8 hours.    DG CHEST PORT 1 VIEW  Result Date: 12/11/2019 CLINICAL DATA:  Pneumothorax EXAM: PORTABLE CHEST 1 VIEW COMPARISON:  12/10/2019 FINDINGS: The heart size and mediastinal contours are unchanged with cardiomegaly. Aortic knob  calcifications. There is prominence of the central pulmonary vasculature. No right-sided pneumothorax is seen. Overlying subcutaneous emphysema is noted. IMPRESSION: Mild pulmonary vascular congestion.  No residual pneumothorax. Electronically Signed   By: Prudencio Pair M.D.   On: 12/11/2019 05:39   DG Chest Portable 1 View  Result Date: 12/10/2019 CLINICAL DATA:  Follow-up chest tube. EXAM: PORTABLE CHEST 1 VIEW COMPARISON:  Chest radiograph from earlier today. FINDINGS: Right apical chest tube terminates in the medial right apical pleural space. Stable cardiomediastinal silhouette with mild cardiomegaly. No pneumothorax. No pleural effusion. Stable coarsened interstitial opacities in both lungs with mild left basilar atelectasis. IMPRESSION: 1. No pneumothorax. Right apical chest tube in place. 2. Stable mild cardiomegaly. 3. Stable coarsened interstitial opacities in both lungs with mild left basilar atelectasis. Electronically Signed   By: Ilona Sorrel M.D.   On: 12/10/2019 07:13   DG Chest  Portable 1 View  Result Date: 12/10/2019 CLINICAL DATA:  Pneumothorax, chest tube placement EXAM: PORTABLE CHEST 1 VIEW COMPARISON:  Chest radiograph from earlier today. FINDINGS: Right chest tube terminates in apparent extrapleural location along the lateral margin of right fourth rib with surrounding mild lateral right chest wall subcutaneous emphysema. Stable cardiomediastinal silhouette with mild cardiomegaly. No appreciable residual pneumothorax. No pleural effusion. Diffuse interstitial opacities with resolved right lung atelectasis. IMPRESSION: 1. No appreciable residual pneumothorax. Right chest tube terminates in apparent extrapleural location along the lateral margin of the right fourth rib with surrounding mild lateral right chest wall subcutaneous emphysema. 2. Stable mild cardiomegaly with diffuse interstitial opacities, suggesting mild pulmonary edema. These results were called by telephone at the time of  interpretation on 12/10/2019 at 6:22 am to provider [facility] , who verbally acknowledged these results. Electronically Signed   By: Ilona Sorrel M.D.   On: 12/10/2019 06:23   DG Chest Portable 1 View  Result Date: 12/10/2019 CLINICAL DATA:  Dyspnea EXAM: PORTABLE CHEST 1 VIEW COMPARISON:  11/30/2011 chest CT FINDINGS: Mild cardiomegaly. Large right pneumothorax, approximately 60-70 %. No left pneumothorax. Slight left mediastinal shift. Complete right lung atelectasis. Mild interstitial prominence throughout the left lung. IMPRESSION: 1. Large right pneumothorax with slight left mediastinal shift, suggesting tension pneumothorax. Complete right lung atelectasis. 2. Mild cardiomegaly. Mild interstitial prominence throughout the left lung, cannot exclude mild pulmonary edema. Critical Value/emergent results were called by telephone at the time of interpretation on 12/10/2019 at 5:13 am to provider DR. Nancy Fetter, who verbally acknowledged these results. Electronically Signed   By: Ilona Sorrel M.D.   On: 12/10/2019 05:17   ECHOCARDIOGRAM COMPLETE  Result Date: 12/11/2019    ECHOCARDIOGRAM REPORT   Patient Name:   AVORY BARRETTA [facility] Date of Exam: 12/10/2019 Medical Rec #:  [id number]        Height:       71.0 in Accession #:    [id number]       Weight:       272.0 lb Date of Birth:  31-May-1932        BSA:          2.403 m Patient Age:    31 years         BP:           113/58 mmHg Patient Gender: M                HR:           88 bpm. Exam Location:  [facility] Procedure: 2D Echo, Cardiac Doppler and Color Doppler Indications:     Congestive Heart Failure 428.0 / I50.9  History:         Patient has no prior history of Echocardiogram examinations.                  Stroke, Arrythmias:Atrial Fibrillation; Risk                  Factors:Hypertension.  Sonographer:     Alyse Low Roar Referring Phys:  [id number] AGBATA Diagnosing Phys: Nelva Bush MD IMPRESSIONS  1. Left ventricular ejection fraction, by estimation, is 45  to 50%. The left ventricle has mildly decreased function. Left ventricular endocardial border not optimally defined to evaluate regional wall motion. There is mild left ventricular hypertrophy.  Left ventricular diastolic parameters are indeterminate.  2. Right ventricular systolic function is normal. The right ventricular size is moderately enlarged. There is moderately elevated pulmonary artery systolic  pressure.  3. Left atrial size was severely dilated.  4. Right atrial size was moderately dilated.  5. The mitral valve is degenerative. Moderate mitral valve regurgitation. No evidence of mitral stenosis.  6. Tricuspid valve regurgitation is moderate to severe.  7. The aortic valve has an indeterminant number of cusps. Aortic valve regurgitation is not visualized. Mild aortic valve sclerosis is present, with no evidence of aortic valve stenosis. FINDINGS  Left Ventricle: Left ventricular ejection fraction, by estimation, is 45 to 50%. The left ventricle has mildly decreased function. Left ventricular endocardial border not optimally defined to evaluate regional wall motion. The left ventricular internal cavity size was normal in size. There is mild left ventricular hypertrophy. Left ventricular diastolic parameters are indeterminate. Right Ventricle: The right ventricular size is moderately enlarged. Right vetricular wall thickness was not assessed. Right ventricular systolic function is normal. There is moderately elevated pulmonary artery systolic pressure. The tricuspid regurgitant velocity is 2.96 m/s, and with an assumed right atrial pressure of 10 mmHg, the estimated right ventricular systolic pressure is 0000000 mmHg. Left Atrium: Left atrial size was severely dilated. Right Atrium: Right atrial size was moderately dilated. Pericardium: A small pericardial effusion is present. Mitral Valve: The mitral valve is degenerative in appearance. There is mild thickening of the mitral valve leaflet(s). Mild mitral  annular calcification. Moderate mitral valve regurgitation, with eccentric posteriorly directed jet. No evidence of mitral valve stenosis. Tricuspid Valve: The tricuspid valve is normal in structure. Tricuspid valve regurgitation is moderate to severe. Aortic Valve: The aortic valve has an indeterminant number of cusps. . There is mild thickening and mild calcification of the aortic valve. Aortic valve regurgitation is not visualized. Mild aortic valve sclerosis is present, with no evidence of aortic valve stenosis. There is mild thickening of the aortic valve. There is mild calcification of the aortic valve. Aortic valve mean gradient measures 4.0 mmHg. Aortic valve peak gradient measures 8.4 mmHg. Aortic valve area, by VTI measures 1.42 cm. Pulmonic Valve: The pulmonic valve was not well visualized. Pulmonic valve regurgitation is trivial. No evidence of pulmonic stenosis. Aorta: The aortic root is normal in size and structure. Pulmonary Artery: The pulmonary artery is of normal size. IAS/Shunts: The interatrial septum was not well visualized.  LEFT VENTRICLE PLAX 2D LVIDd:         5.46 cm  Diastology LVIDs:         4.07 cm  LV e' lateral:   14.90 cm/s LV PW:         1.18 cm  LV E/e' lateral: 8.3 LV IVS:        1.23 cm  LV e' medial:    8.70 cm/s LVOT diam:     1.70 cm  LV E/e' medial:  14.3 LV SV:         31 LV SV Index:   13 LVOT Area:     2.27 cm  RIGHT VENTRICLE RV Mid diam:    4.86 cm RV S prime:     20.00 cm/s TAPSE (M-mode): 2.0 cm LEFT ATRIUM              Index       RIGHT ATRIUM           Index LA diam:        5.20 cm  2.16 cm/m  RA Area:     26.40 cm LA Vol (A2C):   146.0 ml 60.76 ml/m RA Volume:   87.30 ml  36.33 ml/m LA  Vol (A4C):   169.0 ml 70.33 ml/m LA Biplane Vol: 163.0 ml 67.83 ml/m  AORTIC VALVE                   PULMONIC VALVE AV Area (Vmax):    1.29 cm    PV Vmax:        0.90 m/s AV Area (Vmean):   1.27 cm    PV Peak grad:   3.2 mmHg AV Area (VTI):     1.42 cm    RVOT Peak grad: 2  mmHg AV Vmax:           145.00 cm/s AV Vmean:          95.300 cm/s AV VTI:            0.216 m AV Peak Grad:      8.4 mmHg AV Mean Grad:      4.0 mmHg LVOT Vmax:         82.50 cm/s LVOT Vmean:        53.400 cm/s LVOT VTI:          0.135 m LVOT/AV VTI ratio: 0.62  AORTA Ao Root diam: 3.10 cm MITRAL VALVE                TRICUSPID VALVE MV Area (PHT): 4.86 cm     TR Peak grad:   35.0 mmHg MV Decel Time: 156 msec     TR Vmax:        296.00 cm/s MV E velocity: 124.00 cm/s                             SHUNTS                             Systemic VTI:  0.14 m                             Systemic Diam: 1.70 cm Nelva Bush MD Electronically signed by Nelva Bush MD Signature Date/Time: 12/11/2019/12:39:22 PM    Final     Review of Systems  Constitutional: Negative for chills and fever.  Respiratory: Positive for shortness of breath.   Cardiovascular: Positive for palpitations and leg swelling.  Gastrointestinal: Negative for nausea and vomiting.   Blood pressure 110/71, pulse 72, temperature 97.8 F (36.6 C), temperature source Oral, resp. rate 17, height 5\' 11"  (1.803 m), weight 123.9 kg, SpO2 96 %. Physical Exam  Constitutional: He is oriented to person, place, and time. He appears well-developed and well-nourished. No distress.  HENT:  Head: Normocephalic and atraumatic.  Eyes: Right eye exhibits no discharge. Left eye exhibits no discharge. No scleral icterus.  Neck: No tracheal deviation present.  Cardiovascular: An irregularly irregular rhythm present. Tachycardia present.  Respiratory: No stridor. No respiratory distress.  Chest tube in place.  Air leak persists, but is less vigorous than on yesterday's evaluation.  GI: Soft. He exhibits no distension.  Genitourinary:    Genitourinary Comments: Deferred   Musculoskeletal:        General: Edema present.  Neurological: He is alert and oriented to person, place, and time.  Skin: Skin is warm and dry.  Psychiatric: He has a normal mood and  affect. His behavior is normal.    Assessment/Plan: This is an 86 year old man with an extensive tobacco abuse history.  He presented  to the emergency department with a spontaneous pneumothorax that did have a tension component.  A chest tube was placed with resolution of the pneumothorax.  He has an air leak present.  This is not unexpected in the setting of what likely represents a ruptured bleb.  Typically, the air leak will resolve as the site of rupture heals.  This is generally just a matter of time.  If there is a persistent air leak, however, we may need to consider pleurodesis. Of note, Dr. Genevive Bi is away this week and therefore any significant cardiothoracic intervention would require transfer to a different facility.  --Continue chest tube to suction --We will repeat a chest x-ray in the morning.  Fredirick Maudlin 12/11/2019, 12:54 PM

## 2019-12-11 NOTE — Progress Notes (Addendum)
Patient on 3L of oxygen, which is acute. Attempted to wean to 2L but with exertion upon sitting up on the side of the bed oxygen level decreased to 83%. Patient put back on 3L with oxygen level up to 92%. Will continue to monitor.  Patient complaining of constipation- MD Notified- New orders put in.   MD asked about orders for chest tube. Chest tube currently on suction. Per surgery note- continue to suction, different orders put in from hospitalist. This RN informed MD about surgery note. Still awaiting response. Will continue to monitor chest tube.

## 2019-12-11 NOTE — Progress Notes (Addendum)
PROGRESS NOTE    [name] Hardin  [id number] DOB: Apr 05, 1932 DOA: 12/10/2019 PCP: [name], MD    Chief Complaint  Patient presents with  . Respiratory Distress    Brief Narrative: 86 year old male with history of a flutter, chronic PE on Coumadin diabetes mellitus (controlled), history of stroke, hypertension, COPD not on home O2 presented with acute onset of shortness of breath with chest tightness.  Patient was found to be hypoxic in the 70s per EMS.  He was in respiratory distress in the ED with chest x-ray showing large right pneumothorax with complete atelectasis and mild left-sided mediastinal shift suggesting tension pneumothorax.  Chest tube inserted emergently with resolution of pneumothorax. Patient also found to be in A. fib with RVR with improvement after receiving IV Cardizem dose. Patient also found to have new onset CHF. Of note patient was seen by his PCP on 5/10 for increasing leg swelling and some shortness of breath for which his torsemide dose was increased from 20 mg to 40 mg and then up to 100 mg daily.   Assessment & Plan:   Principal Problem: Tension pneumothorax of right lung Likely secondary to ruptured emphysematous bullae.  S/p chest tube placement.  Air leak present.  Surgery consult appreciated.  Recommends air leak should resolve with healing of the rupture and if persistent may need pleurodesis.  Chest x-ray this morning shows no residual pneumothorax and pulmonary vascular congestion. No cardiothoracic surgery available this week and if patient needs pleurodesis will need to be transferred to [facility]. Continue chest tube monitoring and daily chest x-ray.  Morphine as needed for pain.  Active Problems:  Acute respiratory failure with hypoxia (HCC) Secondary to pneumothorax and acute CHF.  Maintaining sats on 3 L via nasal cannula.  IV diuresis, chest tube monitoring.    Atrial fibrillation with rapid ventricular response  (HCC) Currently in sinus.  Likely triggered by acute respiratory distress.  Continue aspirin, Cardizem.  Coumadin held for supratherapeutic INR.  Dosing per pharmacy.  Acute systolic CHF (HCC) Progressive shortness of breath for past few weeks with torsemide dose increased by PCP. Lasix was increased to 80 mg/h.  Strict I/O Daily weight.  2D echo with EF of 45 to 50%.  Continue statin.  Cardiology consult appreciated.  Hypokalemia Replenish with daily potassium.  Follow magnesium.  COPD Not on home O2.  Quit smoking over 25 years ago.  No acute exacerbation.  Continue home inhaler and nebs.     Type 2 diabetes mellitus ([place]) Controlled.  Monitor on sliding scale coverage  PVCs Potassium replenished.  Check magnesium.    DVT prophylaxis: Coumadin Code Status: DNR Family Communication: Daughter at bedside Disposition:   Status is: Inpatient  Remains inpatient appropriate because:Inpatient level of care appropriate due to severity of illness.  Pneumothorax with chest tube placement, rapid A. fib, acute CHF needing IV  diuresis   Dispo: The patient is from: Home              Anticipated d/c is to: Home              Anticipated d/c date is: 3 days              Patient currently is not medically stable to d/c.        Consultants:   Surgery   Procedures:  Chest tube placement, 2D echo    Antimicrobials: None  Subjective: Complains of shortness of breath and leg swellings, reported nausea with an  episode of vomiting this morning.  Objective: Vitals:   12/11/19 0000 12/11/19 0412 12/11/19 0747 12/11/19 1121  BP:  118/81 (!) 103/58 110/71  Pulse:  96 100 72  Resp: 11  17 17   Temp:  98.3 F (36.8 C) 98 F (36.7 C) 97.8 F (36.6 C)  TempSrc:    Oral  SpO2:  97% 95% 96%  Weight:  123.9 kg    Height:        Intake/Output Summary (Last 24 hours) at 12/11/2019 1144 Last data filed at 12/11/2019 0950 Gross per 24 hour  Intake 1080 ml  Output 1435 ml  Net -355  ml   Filed Weights   12/10/19 1215 12/10/19 1339 12/11/19 0412  Weight: 123.4 kg 123.4 kg 123.9 kg    Examination:  General: Elderly male, fatigued, not in distress HEENT: Moist mucosa, supple neck, no JVD Chest: Bibasilar crackles, right-sided chest tube, aortic + CVs: S1-S2 normal, no murmur rub or gallop GI: Soft, nondistended, nontender Musculoskeletal: Warm, 2+ pitting edema bilaterally    Data Reviewed: I have personally reviewed following labs and imaging studies  CBC: Recent Labs  Lab 12/10/19 0503  WBC 8.4  NEUTROABS 4.7  HGB 11.9*  HCT 37.0*  MCV 99.2  PLT 149*    Basic Metabolic Panel: Recent Labs  Lab 12/10/19 0503 12/11/19 0454  NA 140 138  K 2.9* 4.6  CL 102 98  CO2 25 29  GLUCOSE 148* 120*  BUN 25* 28*  CREATININE 1.34* 1.33*  CALCIUM 8.9 8.9    GFR: Estimated Creatinine Clearance: 54.4 mL/min (A) (by C-G formula based on SCr of 1.33 mg/dL (H)).  Liver Function Tests: Recent Labs  Lab 12/10/19 0503  AST 29  ALT 18  ALKPHOS 101  BILITOT 1.1  PROT 6.7  ALBUMIN 3.8    CBG: Recent Labs  Lab 12/10/19 1218 12/10/19 1549 12/10/19 2057 12/11/19 0749 12/11/19 1122  GLUCAP 252* 157* 110* 123* 116*     Recent Results (from the past 240 hour(s))  SARS Coronavirus 2 by RT PCR (hospital order, performed in [facility] hospital lab) Nasopharyngeal Nasopharyngeal Swab     Status: None   Collection Time: 12/10/19  5:05 AM   Specimen: Nasopharyngeal Swab  Result Value Ref Range Status   SARS Coronavirus 2 NEGATIVE NEGATIVE Final    Comment: (NOTE) SARS-CoV-2 target nucleic acids are NOT DETECTED. The SARS-CoV-2 RNA is generally detectable in upper and lower respiratory specimens during the acute phase of infection. The lowest concentration of SARS-CoV-2 viral copies this assay can detect is 250 copies / mL. A negative result does not preclude SARS-CoV-2 infection and should not be used as the sole basis for treatment or other patient  management decisions.  A negative result may occur with improper specimen collection / handling, submission of specimen other than nasopharyngeal swab, presence of viral mutation(s) within the areas targeted by this assay, and inadequate number of viral copies (<250 copies / mL). A negative result must be combined with clinical observations, patient history, and epidemiological information. Fact Sheet for Patients:   StrictlyIdeas.no Fact Sheet for Healthcare Providers: BankingDealers.co.za This test is not yet approved or cleared  by the [place] FDA and has been authorized for detection and/or diagnosis of SARS-CoV-2 by FDA under an Emergency Use Authorization (EUA).  This EUA will remain in effect (meaning this test can be used) for the duration of the COVID-19 declaration under Section 564(b)(1) of the Act, 21 U.S.C. section 360bbb-3(b)(1), unless the authorization  is terminated or revoked sooner. Performed at [facility], [address] [place], [place] [address]          Radiology Studies: DG CHEST PORT 1 VIEW  Result Date: 12/11/2019 CLINICAL DATA:  Pneumothorax EXAM: PORTABLE CHEST 1 VIEW COMPARISON:  12/10/2019 FINDINGS: The heart size and mediastinal contours are unchanged with cardiomegaly. Aortic knob calcifications. There is prominence of the central pulmonary vasculature. No right-sided pneumothorax is seen. Overlying subcutaneous emphysema is noted. IMPRESSION: Mild pulmonary vascular congestion.  No residual pneumothorax. Electronically Signed   By: Prudencio Pair M.D.   On: 12/11/2019 05:39   DG Chest Portable 1 View  Result Date: 12/10/2019 CLINICAL DATA:  Follow-up chest tube. EXAM: PORTABLE CHEST 1 VIEW COMPARISON:  Chest radiograph from earlier today. FINDINGS: Right apical chest tube terminates in the medial right apical pleural space. Stable cardiomediastinal silhouette with mild cardiomegaly. No  pneumothorax. No pleural effusion. Stable coarsened interstitial opacities in both lungs with mild left basilar atelectasis. IMPRESSION: 1. No pneumothorax. Right apical chest tube in place. 2. Stable mild cardiomegaly. 3. Stable coarsened interstitial opacities in both lungs with mild left basilar atelectasis. Electronically Signed   By: Ilona Sorrel M.D.   On: 12/10/2019 07:13   DG Chest Portable 1 View  Result Date: 12/10/2019 CLINICAL DATA:  Pneumothorax, chest tube placement EXAM: PORTABLE CHEST 1 VIEW COMPARISON:  Chest radiograph from earlier today. FINDINGS: Right chest tube terminates in apparent extrapleural location along the lateral margin of right fourth rib with surrounding mild lateral right chest wall subcutaneous emphysema. Stable cardiomediastinal silhouette with mild cardiomegaly. No appreciable residual pneumothorax. No pleural effusion. Diffuse interstitial opacities with resolved right lung atelectasis. IMPRESSION: 1. No appreciable residual pneumothorax. Right chest tube terminates in apparent extrapleural location along the lateral margin of the right fourth rib with surrounding mild lateral right chest wall subcutaneous emphysema. 2. Stable mild cardiomegaly with diffuse interstitial opacities, suggesting mild pulmonary edema. These results were called by telephone at the time of interpretation on 12/10/2019 at 6:22 am to provider [facility] , who verbally acknowledged these results. Electronically Signed   By: Ilona Sorrel M.D.   On: 12/10/2019 06:23   DG Chest Portable 1 View  Result Date: 12/10/2019 CLINICAL DATA:  Dyspnea EXAM: PORTABLE CHEST 1 VIEW COMPARISON:  11/30/2011 chest CT FINDINGS: Mild cardiomegaly. Large right pneumothorax, approximately 60-70 %. No left pneumothorax. Slight left mediastinal shift. Complete right lung atelectasis. Mild interstitial prominence throughout the left lung. IMPRESSION: 1. Large right pneumothorax with slight left mediastinal shift,  suggesting tension pneumothorax. Complete right lung atelectasis. 2. Mild cardiomegaly. Mild interstitial prominence throughout the left lung, cannot exclude mild pulmonary edema. Critical Value/emergent results were called by telephone at the time of interpretation on 12/10/2019 at 5:13 am to provider DR. Nancy Fetter, who verbally acknowledged these results. Electronically Signed   By: Ilona Sorrel M.D.   On: 12/10/2019 05:17        Scheduled Meds: . allopurinol  300 mg Oral Daily  . aspirin EC  81 mg Oral Daily  . colchicine  0.6 mg Oral QODAY  . diltiazem  240 mg Oral Daily  . furosemide  60 mg Intravenous BID  . insulin aspart  0-20 Units Subcutaneous TID WC  . mometasone-formoterol  2 puff Inhalation BID  . multivitamin with minerals  1 tablet Oral Daily  . potassium chloride  40 mEq Oral BID  . simvastatin  40 mg Oral q1800  . sodium chloride flush  3 mL Intravenous  Q12H  . tiotropium  18 mcg Inhalation Daily  . Warfarin - Pharmacist Dosing Inpatient   Does not apply q1600   Continuous Infusions: . sodium chloride       LOS: 1 day    Time spent: 35 minutes    Ameliya Nicotra, MD [facility]   To contact the attending provider between 7A-7P or the covering provider during after hours 7P-7A, please log into the web site [email or web address] and access using universal [place] password for that web site. If you do not have the password, please call the hospital operator.  12/11/2019, 11:44 AM

## 2019-12-12 ENCOUNTER — Inpatient Hospital Stay: Payer: Medicare Other

## 2019-12-12 ENCOUNTER — Encounter: Payer: Self-pay | Admitting: Internal Medicine

## 2019-12-12 DIAGNOSIS — J930 Spontaneous tension pneumothorax: Secondary | ICD-10-CM

## 2019-12-12 DIAGNOSIS — I5021 Acute systolic (congestive) heart failure: Secondary | ICD-10-CM

## 2019-12-12 LAB — BASIC METABOLIC PANEL
Anion gap: 10 (ref 5–15)
BUN: 35 mg/dL — ABNORMAL HIGH (ref 8–23)
CO2: 29 mmol/L (ref 22–32)
Calcium: 8.8 mg/dL — ABNORMAL LOW (ref 8.9–10.3)
Chloride: 101 mmol/L (ref 98–111)
Creatinine, Ser: 1.31 mg/dL — ABNORMAL HIGH (ref 0.61–1.24)
GFR calc Af Amer: 57 mL/min — ABNORMAL LOW (ref >60)
GFR calc non Af Amer: 49 mL/min — ABNORMAL LOW (ref >60)
Glucose, Bld: 103 mg/dL — ABNORMAL HIGH (ref 70–99)
Potassium: 4.2 mmol/L (ref 3.5–5.1)
Sodium: 140 mmol/L (ref 135–145)

## 2019-12-12 LAB — GLUCOSE, CAPILLARY
Glucose-Capillary: 101 mg/dL — ABNORMAL HIGH (ref 70–99)
Glucose-Capillary: 98 mg/dL (ref 70–99)
Glucose-Capillary: 119 mg/dL — ABNORMAL HIGH (ref 70–99)
Glucose-Capillary: 114 mg/dL — ABNORMAL HIGH (ref 70–99)

## 2019-12-12 LAB — CBC
HCT: 34.3 % — ABNORMAL LOW (ref 39.0–52.0)
Hemoglobin: 11 g/dL — ABNORMAL LOW (ref 13.0–17.0)
MCH: 31.9 pg (ref 26.0–34.0)
MCHC: 32.1 g/dL (ref 30.0–36.0)
MCV: 99.4 fL (ref 80.0–100.0)
Platelets: 147 10*3/uL — ABNORMAL LOW (ref 150–400)
RBC: 3.45 MIL/uL — ABNORMAL LOW (ref 4.22–5.81)
RDW: 16 % — ABNORMAL HIGH (ref 11.5–15.5)
WBC: 12 10*3/uL — ABNORMAL HIGH (ref 4.0–10.5)
nRBC: 0 % (ref 0.0–0.2)

## 2019-12-12 LAB — BRAIN NATRIURETIC PEPTIDE: B Natriuretic Peptide: 541.8 pg/mL — ABNORMAL HIGH (ref 0.0–100.0)

## 2019-12-12 LAB — PROTIME-INR
INR: 3 — ABNORMAL HIGH (ref 0.8–1.2)
Prothrombin Time: 30 seconds — ABNORMAL HIGH (ref 11.4–15.2)

## 2019-12-12 MED ORDER — IOHEXOL 300 MG/ML  SOLN
75.0000 mL | Freq: Once | INTRAMUSCULAR | Status: AC | PRN
  Administered 2019-12-12: 75 mL via INTRAVENOUS

## 2019-12-12 MED ORDER — BISACODYL 10 MG RE SUPP
10.0000 mg | Freq: Once | RECTAL | Status: AC
  Administered 2019-12-12: 10 mg via RECTAL
  Filled 2019-12-12: qty 1

## 2019-12-12 MED ORDER — FLEET ENEMA 7-19 GM/118ML RE ENEM
1.0000 | ENEMA | Freq: Once | RECTAL | Status: AC
  Administered 2019-12-12: 1 via RECTAL

## 2019-12-12 NOTE — Consult Note (Signed)
[place] for Warfarin  Indication: atrial fibrillation  No Known Allergies  Patient Measurements: Height: 5\' 11"  (180.3 cm) Weight: 121.2 kg (267 lb 3.2 oz) IBW/kg (Calculated) : 75.3  Vital Signs: Temp: 98 F (36.7 C) (06/02 1206) Temp Source: Oral (06/02 1206) BP: 111/68 (06/02 1206) Pulse Rate: 79 (06/02 1206)  Labs: Recent Labs    12/10/19 0503 12/10/19 1219 12/11/19 0454 12/12/19 0428  HGB 11.9*  --   --  11.0*  HCT 37.0*  --   --  34.3*  PLT 149*  --   --  147*  LABPROT 29.5*  --  32.9* 30.0*  INR 2.9*  --  3.4* 3.0*  CREATININE 1.34*  --  1.33* 1.31*  TROPONINIHS 46* 81*  --   --     Estimated Creatinine Clearance: 54.6 mL/min (A) (by C-G formula based on SCr of 1.31 mg/dL (H)).   Medical History: Past Medical History:  Diagnosis Date  . ATRIAL FIBRILLATION 03/05/2010   Qualifier: Diagnosis of  By: Lovena Le, MD, Martyn Malay   . Benign hypertension 12/16/2013  . Cancer ([place])    bladder  . Chronic obstructive pulmonary disease ([place]) 12/16/2013  . COPD (chronic obstructive pulmonary disease) ([place])   . Family history of adverse reaction to anesthesia    Brother has a difficult time waking up.  Marland Kitchen Hx pulmonary embolism 08/2000  . Hypercholesterolemia   . Hyperglycemia   . Hypertension   . Osteoarthritis   . Pre-diabetes   . PREMATURE VENTRICULAR CONTRACTIONS 03/05/2010   Qualifier: Diagnosis of  By: Lovena Le, MD, Martyn Malay   . Pulmonary embolism ([place]) 12/16/2013  . Stroke [facility]) 1997    Assessment: Pharmacy consulted for warfarin dosing and monitoring for 86 yo amel admitted with SOB. Patient found to have right pneumothorax. INR on admission 2.9  DDI: Allopurinol - Patient on both PTA  Home Dose: Warfarin 5mg  daily    DATE INR DOSE 5/31 2.9 4 mg  6/1 3.4 HOLD 6/2       3.0       HOLD  Goal of Therapy:  INR 2-3 Monitor platelets by anticoagulation protocol: Yes   Plan:  INR therapeutic. Will  continue to  hold warfarin for now pending decision on further procedures. Daily INR ordered. CBC every 3 days at least.  .  Pharmacy will continue to follow and dose based on INR.   Paulina Fusi, PharmD, BCPS 12/12/2019 1:21 PM

## 2019-12-12 NOTE — Progress Notes (Addendum)
[facility] SURGICAL PROGRESS NOTE (cpt [phone])  Hospital Day(s): 2.   Interval History: Patient seen and examined, no acute events or new complaints overnight. Patient reports he is feeling better this morning. No complaints of fever, chills, chest pain, or shortness of breath. He is still requiring supplemental O2 which he reports not using at home. He continues to have small air leak on pleurovac.   Review of Systems:  Constitutional: denies fever, chills  HEENT: denies cough or congestion  Respiratory: denies any shortness of breath  Cardiovascular: denies chest pain or palpitations  Gastrointestinal: denies abdominal pain, N/V, or diarrhea/and bowel function as per interval history Genitourinary: denies burning with urination or urinary frequency   Vital signs in last 24 hours: [min-max] current  Temp:  [97.9 F (36.6 C)-98.4 F (36.9 C)] 98 F (36.7 C) (06/02 0801) Pulse Rate:  [74-98] 76 (06/02 0906) Resp:  [17-18] 18 (06/02 0801) BP: (96-120)/(54-81) 117/64 (06/02 0906) SpO2:  [90 %-96 %] 92 % (06/02 0906) Weight:  [121.2 kg] 121.2 kg (06/02 0351)     Height: 5\' 11"  (180.3 cm) Weight: 121.2 kg BMI (Calculated): 37.28   Intake/Output last 2 shifts:  06/01 0701 - 06/02 0700 In: 600 [P.O.:600] Out: [id number] [Urine:2725; Chest Tube:530]   Physical Exam:  Constitutional: alert, cooperative and no distress  HENT: normocephalic without obvious abnormality  Eyes: PERRL, EOM's grossly intact and symmetric  Respiratory: breathing non-labored at rest, on [place] Cardiovascular: regular rate and sinus rhythm  Chest: Chest tube to the right lateral chest wall, output is serosanguinous, there is a small air leak Genitourinary: Foley in place Musculoskeletal: no edema or wounds, motor and sensation grossly intact, NT    Labs:  CBC Latest Ref Rng & Units 12/12/2019 12/10/2019 08/07/2014  WBC 4.0 - 10.5 K/uL 12.0(H) 8.4 8.8  Hemoglobin 13.0 - 17.0 g/dL 11.0(L) 11.9(L) 14.7   Hematocrit 39.0 - 52.0 % 34.3(L) 37.0(L) 44.8  Platelets 150 - 400 K/uL 147(L) 149(L) 152   CMP Latest Ref Rng & Units 12/12/2019 12/11/2019 12/10/2019  Glucose 70 - 99 mg/dL 103(H) 120(H) 148(H)  BUN 8 - 23 mg/dL 35(H) 28(H) 25(H)  Creatinine 0.61 - 1.24 mg/dL 1.31(H) 1.33(H) 1.34(H)  Sodium 135 - 145 mmol/L 140 138 140  Potassium 3.5 - 5.1 mmol/L 4.2 4.6 2.9(L)  Chloride 98 - 111 mmol/L 101 98 102  CO2 22 - 32 mmol/L 29 29 25   Calcium 8.9 - 10.3 mg/dL 8.8(L) 8.9 8.9  Total Protein 6.5 - 8.1 g/dL - - 6.7  Total Bilirubin 0.3 - 1.2 mg/dL - - 1.1  Alkaline Phos 38 - 126 U/L - - 101  AST 15 - 41 U/L - - 29  ALT 0 - 44 U/L - - 18     Imaging studies:  CXR (12/12/2019) personally reviewed showing slight increase in apical pneumothorax, stable subcutaneous emphysema, and radiologist report reviewed:  IMPRESSION: 1. Slight increase in small right apical pneumothorax which remains less than 10%). 2. Increasing subcutaneous emphysema.   Assessment/Plan: (ICD-10's: J93.0) 86 y.o. male with persistent small air leak on pleurovac admitted with spontaneous right sided tension pneumothorax s/p chest tube placement on 05/31 likely secondary to ruptured bleb, complicated by pertinent comorbidities including atrial fibrillation on warfarin, COPD, and history of PE.   - I changed his dressing at bedside this morning and I do believe he was kinking his chest tube. This was repositioned with dressing change.    - Recommend continuing CT on suction (-20 cm) given  persistent air leak  - He will likely require repeat CT Chest to re-evaluate pulmonary disease, (such as blebs) either today -vs- tomorrow morning   - If air leak persists he will likely require intervention with either pleurodesis or VATS with blebectomy.  Dr Genevive Bi is off this week, so thoracic surgical resources are very limited. If intervention is warranted he will require transfer to [facility].   - Pulmonary toilet  - pain control prn  -  monitor chest tube output  - further management per primary service; we will follow   All of the above findings and recommendations were discussed with the patient, patient's family, and the medical team, and all of patient's and family's questions were answered to their expressed satisfaction.  -- Edison Simon, PA-C [facility] 12/12/2019, 11:39 AM [phone] M-F: 7am - 4pm  I saw and evaluated the patient.  I agree with the above documentation, exam, and plan, which I have edited where appropriate. Fredirick Maudlin  12:43 PM

## 2019-12-12 NOTE — Progress Notes (Signed)
[facility]  SUBJECTIVE: Patient laying in bed, reports feeling better, with less shortness of breath, less peripheral edema, chest tube connected to suction   Vitals:   12/11/19 1121 12/11/19 1605 12/11/19 2006 12/12/19 0351  BP: 110/71 120/81 (!) 100/58 112/63  Pulse: 72 98 74 81  Resp: 17 17    Temp: 97.8 F (36.6 C) 98.3 F (36.8 C) 98.4 F (36.9 C) 97.9 F (36.6 C)  TempSrc: Oral  Oral Oral  SpO2: 96% 96% 94% 90%  Weight:    121.2 kg  Height:         Intake/Output Summary (Last 24 hours) at 12/12/2019 0753 Last data filed at 12/12/2019 [id number] Gross per 24 hour  Intake 600 ml  Output 3255 ml  Net -2655 ml      PHYSICAL EXAM  General: Well developed, well nourished, in no acute distress HEENT:  Normocephalic and atramatic Neck:  No JVD.  Lungs: Clear bilaterally to auscultation and percussion. Heart: HRRR . Normal S1 and S2 without gallops or murmurs.  Abdomen: Bowel sounds are positive, abdomen soft and non-tender  Msk:  Back normal, normal gait. Normal strength and tone for age. Extremities: No clubbing, cyanosis or edema.   Neuro: Alert and oriented X 3. Psych:  Good affect, responds appropriately   LABS: Basic Metabolic Panel: Recent Labs    12/11/19 0454 12/12/19 0428  NA 138 140  K 4.6 4.2  CL 98 101  CO2 29 29  GLUCOSE 120* 103*  BUN 28* 35*  CREATININE 1.33* 1.31*  CALCIUM 8.9 8.8*  MG 2.4  --    Liver Function Tests: Recent Labs    12/10/19 0503  AST 29  ALT 18  ALKPHOS 101  BILITOT 1.1  PROT 6.7  ALBUMIN 3.8   No results for input(s): LIPASE, AMYLASE in the last 72 hours. CBC: Recent Labs    12/10/19 0503 12/12/19 0428  WBC 8.4 12.0*  NEUTROABS 4.7  --   HGB 11.9* 11.0*  HCT 37.0* 34.3*  MCV 99.2 99.4  PLT 149* 147*   Cardiac Enzymes: No results for input(s): CKTOTAL, CKMB, CKMBINDEX, TROPONINI in the last 72 hours. BNP: Invalid input(s): POCBNP D-Dimer: No results for input(s): DDIMER in the last 72  hours. Hemoglobin A1C: Recent Labs    12/10/19 0505  HGBA1C 6.0*   Fasting Lipid Panel: No results for input(s): CHOL, HDL, LDLCALC, TRIG, CHOLHDL, LDLDIRECT in the last 72 hours. Thyroid Function Tests: No results for input(s): TSH, T4TOTAL, T3FREE, THYROIDAB in the last 72 hours.  Invalid input(s): FREET3 Anemia Panel: No results for input(s): VITAMINB12, FOLATE, FERRITIN, TIBC, IRON, RETICCTPCT in the last 72 hours.  DG CHEST PORT 1 VIEW  Result Date: 12/11/2019 CLINICAL DATA:  Pneumothorax EXAM: PORTABLE CHEST 1 VIEW COMPARISON:  12/10/2019 FINDINGS: The heart size and mediastinal contours are unchanged with cardiomegaly. Aortic knob calcifications. There is prominence of the central pulmonary vasculature. No right-sided pneumothorax is seen. Overlying subcutaneous emphysema is noted. IMPRESSION: Mild pulmonary vascular congestion.  No residual pneumothorax. Electronically Signed   By: Prudencio Pair M.D.   On: 12/11/2019 05:39   ECHOCARDIOGRAM COMPLETE  Result Date: 12/11/2019    ECHOCARDIOGRAM REPORT   Patient Name:   [name] Hardin [facility] Date of Exam: 12/10/2019 Medical Rec #:  [id number]        Height:       71.0 in Accession #:    [id number]       Weight:       272.0 lb  Date of Birth:  01-14-1932        BSA:          2.403 m Patient Age:    86 years         BP:           113/58 mmHg Patient Gender: M                HR:           88 bpm. Exam Location:  [facility] Procedure: 2D Echo, Cardiac Doppler and Color Doppler Indications:     Congestive Heart Failure 428.0 / I50.9  History:         Patient has no prior history of Echocardiogram examinations.                  Stroke, Arrythmias:Atrial Fibrillation; Risk                  Factors:Hypertension.  Sonographer:     Alyse Low Roar Referring Phys:  [id number] AGBATA Diagnosing Phys: Nelva Bush MD IMPRESSIONS  1. Left ventricular ejection fraction, by estimation, is 45 to 50%. The left ventricle has mildly decreased function. Left ventricular  endocardial border not optimally defined to evaluate regional wall motion. There is mild left ventricular hypertrophy.  Left ventricular diastolic parameters are indeterminate.  2. Right ventricular systolic function is normal. The right ventricular size is moderately enlarged. There is moderately elevated pulmonary artery systolic pressure.  3. Left atrial size was severely dilated.  4. Right atrial size was moderately dilated.  5. The mitral valve is degenerative. Moderate mitral valve regurgitation. No evidence of mitral stenosis.  6. Tricuspid valve regurgitation is moderate to severe.  7. The aortic valve has an indeterminant number of cusps. Aortic valve regurgitation is not visualized. Mild aortic valve sclerosis is present, with no evidence of aortic valve stenosis. FINDINGS  Left Ventricle: Left ventricular ejection fraction, by estimation, is 45 to 50%. The left ventricle has mildly decreased function. Left ventricular endocardial border not optimally defined to evaluate regional wall motion. The left ventricular internal cavity size was normal in size. There is mild left ventricular hypertrophy. Left ventricular diastolic parameters are indeterminate. Right Ventricle: The right ventricular size is moderately enlarged. Right vetricular wall thickness was not assessed. Right ventricular systolic function is normal. There is moderately elevated pulmonary artery systolic pressure. The tricuspid regurgitant velocity is 2.96 m/s, and with an assumed right atrial pressure of 10 mmHg, the estimated right ventricular systolic pressure is 0000000 mmHg. Left Atrium: Left atrial size was severely dilated. Right Atrium: Right atrial size was moderately dilated. Pericardium: A small pericardial effusion is present. Mitral Valve: The mitral valve is degenerative in appearance. There is mild thickening of the mitral valve leaflet(s). Mild mitral annular calcification. Moderate mitral valve regurgitation, with eccentric  posteriorly directed jet. No evidence of mitral valve stenosis. Tricuspid Valve: The tricuspid valve is normal in structure. Tricuspid valve regurgitation is moderate to severe. Aortic Valve: The aortic valve has an indeterminant number of cusps. . There is mild thickening and mild calcification of the aortic valve. Aortic valve regurgitation is not visualized. Mild aortic valve sclerosis is present, with no evidence of aortic valve stenosis. There is mild thickening of the aortic valve. There is mild calcification of the aortic valve. Aortic valve mean gradient measures 4.0 mmHg. Aortic valve peak gradient measures 8.4 mmHg. Aortic valve area, by VTI measures 1.42 cm. Pulmonic Valve: The pulmonic valve was not well visualized. Pulmonic  valve regurgitation is trivial. No evidence of pulmonic stenosis. Aorta: The aortic root is normal in size and structure. Pulmonary Artery: The pulmonary artery is of normal size. IAS/Shunts: The interatrial septum was not well visualized.  LEFT VENTRICLE PLAX 2D LVIDd:         5.46 cm  Diastology LVIDs:         4.07 cm  LV e' lateral:   14.90 cm/s LV PW:         1.18 cm  LV E/e' lateral: 8.3 LV IVS:        1.23 cm  LV e' medial:    8.70 cm/s LVOT diam:     1.70 cm  LV E/e' medial:  14.3 LV SV:         31 LV SV Index:   13 LVOT Area:     2.27 cm  RIGHT VENTRICLE RV Mid diam:    4.86 cm RV S prime:     20.00 cm/s TAPSE (M-mode): 2.0 cm LEFT ATRIUM              Index       RIGHT ATRIUM           Index LA diam:        5.20 cm  2.16 cm/m  RA Area:     26.40 cm LA Vol (A2C):   146.0 ml 60.76 ml/m RA Volume:   87.30 ml  36.33 ml/m LA Vol (A4C):   169.0 ml 70.33 ml/m LA Biplane Vol: 163.0 ml 67.83 ml/m  AORTIC VALVE                   PULMONIC VALVE AV Area (Vmax):    1.29 cm    PV Vmax:        0.90 m/s AV Area (Vmean):   1.27 cm    PV Peak grad:   3.2 mmHg AV Area (VTI):     1.42 cm    RVOT Peak grad: 2 mmHg AV Vmax:           145.00 cm/s AV Vmean:          95.300 cm/s AV VTI:             0.216 m AV Peak Grad:      8.4 mmHg AV Mean Grad:      4.0 mmHg LVOT Vmax:         82.50 cm/s LVOT Vmean:        53.400 cm/s LVOT VTI:          0.135 m LVOT/AV VTI ratio: 0.62  AORTA Ao Root diam: 3.10 cm MITRAL VALVE                TRICUSPID VALVE MV Area (PHT): 4.86 cm     TR Peak grad:   35.0 mmHg MV Decel Time: 156 msec     TR Vmax:        296.00 cm/s MV E velocity: 124.00 cm/s                             SHUNTS                             Systemic VTI:  0.14 m  Systemic Diam: 1.70 cm Nelva Bush MD Electronically signed by Nelva Bush MD Signature Date/Time: 12/11/2019/12:39:22 PM    Final      Echo LVEF 45 to 50%  TELEMETRY: Atrial fibrillation 70 to 80 bpm:  ASSESSMENT AND PLAN:  Principal Problem:   Pneumothorax on right Active Problems:   Atrial fibrillation with rapid ventricular response (HCC)   Gout   Benign hypertension   Chronic obstructive pulmonary disease (HCC)   Type 2 diabetes mellitus (HCC)   Acute CHF (congestive heart failure) (HCC)   Hypokalemia    1.  Acute on chronic diastolic congestive heart failure, exacerbated by right pneumothorax, responding to intravenous furosemide with good diuresis with improvement peripheral edema and overall clinical improvement, LVEF 45 to 50% by 2D echocardiogram 2.  Atrial fibrillation, rate much better controlled on Cardizem CD, was on warfarin for stroke prevention which has been held after chest tube insertion 3.  Right pneumothorax, status post chest tube, connected to suction, followed by surgery 4.  CKD 5.  COPD  Recommendations  1.  Agree with overall current therapy 2.  Continue diuresis 3.  Carefully monitor renal status 4.  Continue Cardizem CD 240 mg daily for rate control 5.  Resume warfarin at previous dose after chest tube removal and resolution of pneumothorax 6.  Defer further cardiac diagnostics at this time   Isaias Cowman, MD, PhD, [facility] 12/12/2019 7:53  AM

## 2019-12-12 NOTE — Progress Notes (Signed)
[place] [facility] [place] NAME: [name] Hardin    MR#:  [id number]  DATE OF BIRTH:  02-13-1932  SUBJECTIVE:   Feels overall better. Son in the room. Patient currently on 3 L nasal cannula oxygen. Air leak present. REVIEW OF SYSTEMS:   Review of Systems  Constitutional: Negative for chills, fever and weight loss.  HENT: Negative for ear discharge, ear pain and nosebleeds.   Eyes: Negative for blurred vision, pain and discharge.  Respiratory: Negative for sputum production, shortness of breath, wheezing and stridor.   Cardiovascular: Negative for chest pain, palpitations, orthopnea and PND.  Gastrointestinal: Negative for abdominal pain, diarrhea, nausea and vomiting.  Genitourinary: Negative for frequency and urgency.  Musculoskeletal: Negative for back pain and joint pain.  Neurological: Negative for sensory change, speech change, focal weakness and weakness.  Psychiatric/Behavioral: Negative for depression and hallucinations. The patient is not nervous/anxious.    Tolerating Diet: yes Tolerating PT: pending  DRUG ALLERGIES:  No Known Allergies  VITALS:  Blood pressure 106/64, pulse 79, temperature 98.5 F (36.9 C), temperature source Oral, resp. rate 18, height 5\' 11"  (1.803 m), weight 121.2 kg, SpO2 93 %.  PHYSICAL EXAMINATION:   Physical Exam  GENERAL:  86 y.o.-year-old patient lying in the bed with no acute distress. obese EYES: Pupils equal, round, reactive to light and accommodation. No scleral icterus.   HEENT: Head atraumatic, normocephalic. Oropharynx and nasopharynx clear.  NECK:  Supple, no jugular venous distention. No thyroid enlargement, no tenderness.  LUNGS: diminished breath sounds bilaterally, no wheezing, rales, rhonchi. No use of accessory muscles of respiration. Right CT+ CARDIOVASCULAR: S1, S2 normal. No murmurs, rubs, or gallops.  ABDOMEN: Soft, nontender, nondistended. Bowel sounds present. No organomegaly or  mass.  EXTREMITIES: No cyanosis, clubbing or edema b/l.    NEUROLOGIC: Cranial nerves II through XII are intact. No focal Motor or sensory deficits b/l.   PSYCHIATRIC:  patient is alert and oriented x 3.  SKIN: No obvious rash, lesion, or ulcer.   LABORATORY PANEL:  CBC Recent Labs  Lab 12/12/19 0428  WBC 12.0*  HGB 11.0*  HCT 34.3*  PLT 147*    Chemistries  Recent Labs  Lab 12/10/19 0503 12/10/19 0503 12/11/19 0454 12/11/19 0454 12/12/19 0428  NA 140   < > 138   < > 140  K 2.9*   < > 4.6   < > 4.2  CL 102   < > 98   < > 101  CO2 25   < > 29   < > 29  GLUCOSE 148*   < > 120*   < > 103*  BUN 25*   < > 28*   < > 35*  CREATININE 1.34*   < > 1.33*   < > 1.31*  CALCIUM 8.9   < > 8.9   < > 8.8*  MG  --   --  2.4  --   --   AST 29  --   --   --   --   ALT 18  --   --   --   --   ALKPHOS 101  --   --   --   --   BILITOT 1.1  --   --   --   --    < > = values in this interval not displayed.   Cardiac Enzymes No results for input(s): TROPONINI in the last 168 hours. RADIOLOGY:  DG CHEST PORT  1 VIEW  Result Date: 12/12/2019 CLINICAL DATA:  86 year old male with pneumothorax EXAM: PORTABLE CHEST 1 VIEW COMPARISON:  Prior chest x-ray 12/11/2019 FINDINGS: Stable cardiac and mediastinal contours. Atherosclerotic calcifications again noted in the transverse aorta. Perhaps slight interval progression of trace right apical pneumothorax. The pneumothorax is approximately 5%. Slightly progressive diffuse subcutaneous emphysema within the soft tissues of the right neck and chest wall. Chronic background bronchitic changes and mild bibasilar atelectasis. IMPRESSION: 1. Slight increase in small right apical pneumothorax which remains less than 10%). 2. Increasing subcutaneous emphysema. Electronically Signed   By: Jacqulynn Cadet M.D.   On: 12/12/2019 08:45   DG CHEST PORT 1 VIEW  Result Date: 12/11/2019 CLINICAL DATA:  Pneumothorax EXAM: PORTABLE CHEST 1 VIEW COMPARISON:  12/10/2019  FINDINGS: The heart size and mediastinal contours are unchanged with cardiomegaly. Aortic knob calcifications. There is prominence of the central pulmonary vasculature. No right-sided pneumothorax is seen. Overlying subcutaneous emphysema is noted. IMPRESSION: Mild pulmonary vascular congestion.  No residual pneumothorax. Electronically Signed   By: Prudencio Pair M.D.   On: 12/11/2019 05:39   ECHOCARDIOGRAM COMPLETE  Result Date: 12/11/2019    ECHOCARDIOGRAM REPORT   Patient Name:   [name] Hardin [facility] Date of Exam: 12/10/2019 Medical Rec #:  [id number]        Height:       71.0 in Accession #:    [id number]       Weight:       272.0 lb Date of Birth:  1932/01/13        BSA:          2.403 m Patient Age:    22 years         BP:           113/58 mmHg Patient Gender: M                HR:           88 bpm. Exam Location:  [facility] Procedure: 2D Echo, Cardiac Doppler and Color Doppler Indications:     Congestive Heart Failure 428.0 / I50.9  History:         Patient has no prior history of Echocardiogram examinations.                  Stroke, Arrythmias:Atrial Fibrillation; Risk                  Factors:Hypertension.  Sonographer:     Alyse Low Roar Referring Phys:  [id number] AGBATA Diagnosing Phys: Nelva Bush MD IMPRESSIONS  1. Left ventricular ejection fraction, by estimation, is 45 to 50%. The left ventricle has mildly decreased function. Left ventricular endocardial border not optimally defined to evaluate regional wall motion. There is mild left ventricular hypertrophy.  Left ventricular diastolic parameters are indeterminate.  2. Right ventricular systolic function is normal. The right ventricular size is moderately enlarged. There is moderately elevated pulmonary artery systolic pressure.  3. Left atrial size was severely dilated.  4. Right atrial size was moderately dilated.  5. The mitral valve is degenerative. Moderate mitral valve regurgitation. No evidence of mitral stenosis.  6. Tricuspid valve  regurgitation is moderate to severe.  7. The aortic valve has an indeterminant number of cusps. Aortic valve regurgitation is not visualized. Mild aortic valve sclerosis is present, with no evidence of aortic valve stenosis. FINDINGS  Left Ventricle: Left ventricular ejection fraction, by estimation, is 45 to 50%. The left ventricle has mildly decreased function. Left ventricular  endocardial border not optimally defined to evaluate regional wall motion. The left ventricular internal cavity size was normal in size. There is mild left ventricular hypertrophy. Left ventricular diastolic parameters are indeterminate. Right Ventricle: The right ventricular size is moderately enlarged. Right vetricular wall thickness was not assessed. Right ventricular systolic function is normal. There is moderately elevated pulmonary artery systolic pressure. The tricuspid regurgitant velocity is 2.96 m/s, and with an assumed right atrial pressure of 10 mmHg, the estimated right ventricular systolic pressure is 0000000 mmHg. Left Atrium: Left atrial size was severely dilated. Right Atrium: Right atrial size was moderately dilated. Pericardium: A small pericardial effusion is present. Mitral Valve: The mitral valve is degenerative in appearance. There is mild thickening of the mitral valve leaflet(s). Mild mitral annular calcification. Moderate mitral valve regurgitation, with eccentric posteriorly directed jet. No evidence of mitral valve stenosis. Tricuspid Valve: The tricuspid valve is normal in structure. Tricuspid valve regurgitation is moderate to severe. Aortic Valve: The aortic valve has an indeterminant number of cusps. . There is mild thickening and mild calcification of the aortic valve. Aortic valve regurgitation is not visualized. Mild aortic valve sclerosis is present, with no evidence of aortic valve stenosis. There is mild thickening of the aortic valve. There is mild calcification of the aortic valve. Aortic valve mean  gradient measures 4.0 mmHg. Aortic valve peak gradient measures 8.4 mmHg. Aortic valve area, by VTI measures 1.42 cm. Pulmonic Valve: The pulmonic valve was not well visualized. Pulmonic valve regurgitation is trivial. No evidence of pulmonic stenosis. Aorta: The aortic root is normal in size and structure. Pulmonary Artery: The pulmonary artery is of normal size. IAS/Shunts: The interatrial septum was not well visualized.  LEFT VENTRICLE PLAX 2D LVIDd:         5.46 cm  Diastology LVIDs:         4.07 cm  LV e' lateral:   14.90 cm/s LV PW:         1.18 cm  LV E/e' lateral: 8.3 LV IVS:        1.23 cm  LV e' medial:    8.70 cm/s LVOT diam:     1.70 cm  LV E/e' medial:  14.3 LV SV:         31 LV SV Index:   13 LVOT Area:     2.27 cm  RIGHT VENTRICLE RV Mid diam:    4.86 cm RV S prime:     20.00 cm/s TAPSE (M-mode): 2.0 cm LEFT ATRIUM              Index       RIGHT ATRIUM           Index LA diam:        5.20 cm  2.16 cm/m  RA Area:     26.40 cm LA Vol (A2C):   146.0 ml 60.76 ml/m RA Volume:   87.30 ml  36.33 ml/m LA Vol (A4C):   169.0 ml 70.33 ml/m LA Biplane Vol: 163.0 ml 67.83 ml/m  AORTIC VALVE                   PULMONIC VALVE AV Area (Vmax):    1.29 cm    PV Vmax:        0.90 m/s AV Area (Vmean):   1.27 cm    PV Peak grad:   3.2 mmHg AV Area (VTI):     1.42 cm    RVOT Peak grad: 2 mmHg AV  Vmax:           145.00 cm/s AV Vmean:          95.300 cm/s AV VTI:            0.216 m AV Peak Grad:      8.4 mmHg AV Mean Grad:      4.0 mmHg LVOT Vmax:         82.50 cm/s LVOT Vmean:        53.400 cm/s LVOT VTI:          0.135 m LVOT/AV VTI ratio: 0.62  AORTA Ao Root diam: 3.10 cm MITRAL VALVE                TRICUSPID VALVE MV Area (PHT): 4.86 cm     TR Peak grad:   35.0 mmHg MV Decel Time: 156 msec     TR Vmax:        296.00 cm/s MV E velocity: 124.00 cm/s                             SHUNTS                             Systemic VTI:  0.14 m                             Systemic Diam: 1.70 cm Nelva Bush MD  Electronically signed by Nelva Bush MD Signature Date/Time: 12/11/2019/12:39:22 PM    Final    ASSESSMENT AND PLAN:  86 year old male with history of a flutter, chronic PE on Coumadin diabetes mellitus (controlled), history of stroke, hypertension, COPD not on home O2 presented with acute onset of shortness of breath with chest tightness.  Patient was found to be hypoxic in the 70s per EMS.  He was in respiratory distress in the ED with chest x-ray showing large right pneumothorax with complete atelectasis and mild left-sided mediastinal shift suggesting tension pneumothorax.  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   -S/p chest tube placement.  Air leak present. -  Surgery consult appreciated.  Recommends air leak should resolve with healing of the rupture and if persistent may need pleurodesis.   -Chest x-ray this morning shows subcutaneous emphysema and 10% increase in apical PTX -No cardiothoracic surgery available this week and if patient needs pleurodesis will need to be transferred to [facility]. -Continue chest tube monitoring and daily chest x-ray.  Morphine as needed for pain.  Acute respiratory failure with hypoxia (HCC) Secondary to pneumothorax and acute CHF.   -Maintaining sats on 3 L via nasal cannula.  IV diuresis, chest tube monitoring.    Atrial fibrillation with rapid ventricular response (HCC) Currently in sinus.  Likely triggered by acute respiratory distress.   -Continue aspirin, Cardizem.  Coumadin held for supratherapeutic INR.  - Dosing per pharmacy. -will resume once CT is out. Not sure if pt will require VATS procedure or not. -INR 3.0 today  Acute systolic CHF (HCC) -Progressive shortness of breath for past few weeks with torsemide dose increased by PCP. -Lasix was increased to 80 mg bid  Strict I/O Daily weight.  -uop about 6.3 liters - 2D echo with EF of 45 to 50%.   -Continue statin.  Cardiology consult  with Dr Josefa Half noted.  Hypokalemia Replenish with daily potassium.  Follow up  Magnesium is 2.4  COPD -Not on home O2.   -Quit smoking over 25 years ago.  - No acute exacerbation.   -Continue home inhaler and nebs.   Type 2 diabetes mellitus ([place]) Controlled.  Monitor on sliding scale coverage  PVCs Potassium replenished.      DVT prophylaxis: Coumadin Code Status: DNR Family Communication: son at bedside Disposition: TBD  Status is: Inpatient  Remains inpatient appropriate because:Inpatient level of care appropriate due to severity of illness.  Pneumothorax with chest tube placement, rapid A. fib, acute CHF needing IV  diuresis   Dispo: The patient is from: Home  Anticipated d/c is to: Home  Anticipated d/c date is: >3 days  Patient currently is not medically stable to d/c. Continues with air leak. May need VATS     TOTAL TIME TAKING CARE OF THIS PATIENT: *30* minutes.  >50% time spent on counselling and coordination of care  Note: This dictation was prepared with Dragon dictation along with smaller phrase technology. Any transcriptional errors that result from this process are unintentional.  Fritzi Mandes M.D    [facility]   CC: Primary care physician; [name], MDPatient ID: [name] Hardin, male   DOB: [birth year], 86 y.o.   MRN: [id number]

## 2019-12-13 ENCOUNTER — Emergency Department (HOSPITAL_COMMUNITY): Payer: Medicare Other

## 2019-12-13 ENCOUNTER — Inpatient Hospital Stay (HOSPITAL_COMMUNITY)
Admission: AD | Admit: 2019-12-13 | Payer: Medicare Other | Source: Other Acute Inpatient Hospital | Admitting: Internal Medicine

## 2019-12-13 ENCOUNTER — Inpatient Hospital Stay: Payer: Medicare Other

## 2019-12-13 ENCOUNTER — Inpatient Hospital Stay (HOSPITAL_COMMUNITY)
Admission: EM | Admit: 2019-12-13 | Discharge: 2020-01-10 | DRG: 199 | Disposition: E | Payer: Medicare Other | Source: Other Acute Inpatient Hospital | Attending: Internal Medicine | Admitting: Internal Medicine

## 2019-12-13 ENCOUNTER — Encounter (HOSPITAL_COMMUNITY): Payer: Self-pay

## 2019-12-13 DIAGNOSIS — Y828 Other medical devices associated with adverse incidents: Secondary | ICD-10-CM | POA: Diagnosis not present

## 2019-12-13 DIAGNOSIS — J9601 Acute respiratory failure with hypoxia: Secondary | ICD-10-CM | POA: Diagnosis present

## 2019-12-13 DIAGNOSIS — R000 Tachycardia, unspecified: Secondary | ICD-10-CM | POA: Diagnosis not present

## 2019-12-13 DIAGNOSIS — Z87891 Personal history of nicotine dependence: Secondary | ICD-10-CM

## 2019-12-13 DIAGNOSIS — I480 Paroxysmal atrial fibrillation: Secondary | ICD-10-CM | POA: Diagnosis present

## 2019-12-13 DIAGNOSIS — I130 Hypertensive heart and chronic kidney disease with heart failure and stage 1 through stage 4 chronic kidney disease, or unspecified chronic kidney disease: Secondary | ICD-10-CM | POA: Diagnosis present

## 2019-12-13 DIAGNOSIS — E669 Obesity, unspecified: Secondary | ICD-10-CM | POA: Diagnosis present

## 2019-12-13 DIAGNOSIS — R0602 Shortness of breath: Secondary | ICD-10-CM

## 2019-12-13 DIAGNOSIS — Z7982 Long term (current) use of aspirin: Secondary | ICD-10-CM

## 2019-12-13 DIAGNOSIS — L8990 Pressure ulcer of unspecified site, unspecified stage: Secondary | ICD-10-CM | POA: Insufficient documentation

## 2019-12-13 DIAGNOSIS — T859XXA Unspecified complication of internal prosthetic device, implant and graft, initial encounter: Secondary | ICD-10-CM | POA: Diagnosis not present

## 2019-12-13 DIAGNOSIS — J982 Interstitial emphysema: Secondary | ICD-10-CM | POA: Diagnosis present

## 2019-12-13 DIAGNOSIS — Y838 Other surgical procedures as the cause of abnormal reaction of the patient, or of later complication, without mention of misadventure at the time of the procedure: Secondary | ICD-10-CM | POA: Diagnosis not present

## 2019-12-13 DIAGNOSIS — Z8673 Personal history of transient ischemic attack (TIA), and cerebral infarction without residual deficits: Secondary | ICD-10-CM

## 2019-12-13 DIAGNOSIS — J939 Pneumothorax, unspecified: Secondary | ICD-10-CM | POA: Diagnosis present

## 2019-12-13 DIAGNOSIS — T797XXA Traumatic subcutaneous emphysema, initial encounter: Secondary | ICD-10-CM

## 2019-12-13 DIAGNOSIS — J9311 Primary spontaneous pneumothorax: Secondary | ICD-10-CM | POA: Diagnosis not present

## 2019-12-13 DIAGNOSIS — Z7901 Long term (current) use of anticoagulants: Secondary | ICD-10-CM

## 2019-12-13 DIAGNOSIS — E1122 Type 2 diabetes mellitus with diabetic chronic kidney disease: Secondary | ICD-10-CM | POA: Diagnosis present

## 2019-12-13 DIAGNOSIS — I452 Bifascicular block: Secondary | ICD-10-CM | POA: Diagnosis not present

## 2019-12-13 DIAGNOSIS — N1831 Chronic kidney disease, stage 3a: Secondary | ICD-10-CM

## 2019-12-13 DIAGNOSIS — N179 Acute kidney failure, unspecified: Secondary | ICD-10-CM | POA: Diagnosis present

## 2019-12-13 DIAGNOSIS — J449 Chronic obstructive pulmonary disease, unspecified: Secondary | ICD-10-CM | POA: Diagnosis present

## 2019-12-13 DIAGNOSIS — R578 Other shock: Secondary | ICD-10-CM | POA: Diagnosis not present

## 2019-12-13 DIAGNOSIS — I2782 Chronic pulmonary embolism: Secondary | ICD-10-CM | POA: Diagnosis present

## 2019-12-13 DIAGNOSIS — E1165 Type 2 diabetes mellitus with hyperglycemia: Secondary | ICD-10-CM | POA: Diagnosis present

## 2019-12-13 DIAGNOSIS — Z20822 Contact with and (suspected) exposure to covid-19: Secondary | ICD-10-CM | POA: Diagnosis present

## 2019-12-13 DIAGNOSIS — Z66 Do not resuscitate: Secondary | ICD-10-CM | POA: Diagnosis present

## 2019-12-13 DIAGNOSIS — Z515 Encounter for palliative care: Secondary | ICD-10-CM | POA: Diagnosis not present

## 2019-12-13 DIAGNOSIS — I2510 Atherosclerotic heart disease of native coronary artery without angina pectoris: Secondary | ICD-10-CM | POA: Diagnosis present

## 2019-12-13 DIAGNOSIS — E7800 Pure hypercholesterolemia, unspecified: Secondary | ICD-10-CM | POA: Diagnosis present

## 2019-12-13 DIAGNOSIS — M1990 Unspecified osteoarthritis, unspecified site: Secondary | ICD-10-CM | POA: Diagnosis present

## 2019-12-13 DIAGNOSIS — Z6837 Body mass index (BMI) 37.0-37.9, adult: Secondary | ICD-10-CM

## 2019-12-13 DIAGNOSIS — I5023 Acute on chronic systolic (congestive) heart failure: Secondary | ICD-10-CM | POA: Diagnosis present

## 2019-12-13 DIAGNOSIS — I4891 Unspecified atrial fibrillation: Secondary | ICD-10-CM | POA: Diagnosis present

## 2019-12-13 DIAGNOSIS — H02843 Edema of right eye, unspecified eyelid: Secondary | ICD-10-CM | POA: Diagnosis present

## 2019-12-13 DIAGNOSIS — M109 Gout, unspecified: Secondary | ICD-10-CM | POA: Diagnosis present

## 2019-12-13 DIAGNOSIS — E119 Type 2 diabetes mellitus without complications: Secondary | ICD-10-CM

## 2019-12-13 DIAGNOSIS — J189 Pneumonia, unspecified organism: Secondary | ICD-10-CM | POA: Diagnosis present

## 2019-12-13 DIAGNOSIS — Z79899 Other long term (current) drug therapy: Secondary | ICD-10-CM

## 2019-12-13 DIAGNOSIS — D649 Anemia, unspecified: Secondary | ICD-10-CM | POA: Diagnosis present

## 2019-12-13 DIAGNOSIS — N183 Chronic kidney disease, stage 3 unspecified: Secondary | ICD-10-CM | POA: Diagnosis present

## 2019-12-13 DIAGNOSIS — L89152 Pressure ulcer of sacral region, stage 2: Secondary | ICD-10-CM | POA: Diagnosis present

## 2019-12-13 DIAGNOSIS — I4892 Unspecified atrial flutter: Secondary | ICD-10-CM | POA: Diagnosis present

## 2019-12-13 DIAGNOSIS — Z9689 Presence of other specified functional implants: Secondary | ICD-10-CM | POA: Diagnosis present

## 2019-12-13 LAB — GLUCOSE, CAPILLARY
Glucose-Capillary: 94 mg/dL (ref 70–99)
Glucose-Capillary: 113 mg/dL — ABNORMAL HIGH (ref 70–99)
Glucose-Capillary: 126 mg/dL — ABNORMAL HIGH (ref 70–99)

## 2019-12-13 LAB — PROTIME-INR
INR: 2.1 — ABNORMAL HIGH (ref 0.8–1.2)
Prothrombin Time: 23.2 seconds — ABNORMAL HIGH (ref 11.4–15.2)

## 2019-12-13 MED ORDER — POLYETHYLENE GLYCOL 3350 17 G PO PACK: 17.0000 g | PACK | Freq: Every day | ORAL | 0 refills | Status: AC

## 2019-12-13 MED ORDER — FENTANYL CITRATE (PF) 100 MCG/2ML IJ SOLN
INTRAMUSCULAR | Status: AC
  Filled 2019-12-13: qty 2

## 2019-12-13 MED ORDER — ALPRAZOLAM 0.25 MG PO TABS
0.2500 mg | ORAL_TABLET | Freq: Once | ORAL | Status: AC
  Administered 2019-12-13: 0.25 mg via ORAL
  Filled 2019-12-13: qty 1

## 2019-12-13 MED ORDER — MIDAZOLAM HCL 2 MG/2ML IJ SOLN
INTRAMUSCULAR | Status: AC
  Filled 2019-12-13: qty 2

## 2019-12-13 NOTE — Progress Notes (Signed)
[facility]    SUBJECTIVE: The patient reports not feeling well this morning with difficulty breathing. He denies chest pain or palpitations.   Vitals:   12/12/19 1939 12/28/2019 0420 12/12/2019 0429 12/29/2019 0801  BP: 94/69 103/67 101/65 119/65  Pulse: 86 82 82 76  Resp: 20 12 18 18   Temp: 98 F (36.7 C) 98.2 F (36.8 C) 98.1 F (36.7 C) 97.8 F (36.6 C)  TempSrc: Oral Oral Oral Oral  SpO2: 90% 93% 95% 95%  Weight:  121 kg    Height:         Intake/Output Summary (Last 24 hours) at 12/27/2019 [id number] Last data filed at 12/28/2019 [id number] Gross per 24 hour  Intake 120 ml  Output 5150 ml  Net -5030 ml      PHYSICAL EXAM  General: Well developed, well nourished, in mild distress distress HEENT:  Right facial with periorbital swelling Lungs: increased effort of on breathing on supplemental oxygen. Speaking in full sentences. Chest tube in place. Chest wall: subcutaneous emphysema right upper chest Heart: irregularly irregular. Normal S1 and S2 without gallops or murmurs.  Abdomen: nondistended  Neuro: Alert and oriented X 3. Psych:  Good affect, responds appropriately   LABS: Basic Metabolic Panel: Recent Labs    12/11/19 0454 12/12/19 0428  NA 138 140  K 4.6 4.2  CL 98 101  CO2 29 29  GLUCOSE 120* 103*  BUN 28* 35*  CREATININE 1.33* 1.31*  CALCIUM 8.9 8.8*  MG 2.4  --    Liver Function Tests: No results for input(s): AST, ALT, ALKPHOS, BILITOT, PROT, ALBUMIN in the last 72 hours. No results for input(s): LIPASE, AMYLASE in the last 72 hours. CBC: Recent Labs    12/12/19 0428  WBC 12.0*  HGB 11.0*  HCT 34.3*  MCV 99.4  PLT 147*   Cardiac Enzymes: No results for input(s): CKTOTAL, CKMB, CKMBINDEX, TROPONINI in the last 72 hours. BNP: Invalid input(s): POCBNP D-Dimer: No results for input(s): DDIMER in the last 72 hours. Hemoglobin A1C: No results for input(s): HGBA1C in the last 72 hours. Fasting Lipid Panel: No results for input(s): CHOL, HDL,  LDLCALC, TRIG, CHOLHDL, LDLDIRECT in the last 72 hours. Thyroid Function Tests: No results for input(s): TSH, T4TOTAL, T3FREE, THYROIDAB in the last 72 hours.  Invalid input(s): FREET3 Anemia Panel: No results for input(s): VITAMINB12, FOLATE, FERRITIN, TIBC, IRON, RETICCTPCT in the last 72 hours.  CT CHEST W CONTRAST  Result Date: 12/12/2019 CLINICAL DATA:  86 year old male with history of right-sided pneumothorax. EXAM: CT CHEST WITH CONTRAST TECHNIQUE: Multidetector CT imaging of the chest was performed during intravenous contrast administration. CONTRAST:  39mL OMNIPAQUE IOHEXOL 300 MG/ML  SOLN COMPARISON:  Chest CTA 07/25/2017. FINDINGS: Cardiovascular: Heart size is enlarged with biatrial dilatation. There is no significant pericardial fluid, thickening or pericardial calcification. There is aortic atherosclerosis, as well as atherosclerosis of the great vessels of the mediastinum and the coronary arteries, including calcified atherosclerotic plaque in the left main, left anterior descending, left circumflex and right coronary arteries. Calcifications of the aortic valve and mitral annulus. Mediastinum/Nodes: Pneumomediastinum. Multiple prominent borderline enlarged mediastinal and hilar lymph nodes are incidentally noted. Esophagus is unremarkable in appearance. No axillary lymphadenopathy. Lungs/Pleura: Moderate-sized right-sided pneumothorax with right-sided chest tube in position directed into the medial aspect of the right hemithorax. However, the tip of the tube (visualized on axial image 32 of series 3 and coronal image 94 of series 5) appears to violate the pleura and extend into the middle mediastinum  immediately adjacent to the trachea. In this region, there is a focal distortion of the tracheal wall best appreciated on axial image 33 of series 3 and the adjacent images, which could suggest a focal area of tracheal trauma. Trace right and small left pleural effusions lying dependently. No  acute consolidative airspace disease. No definite suspicious appearing pulmonary nodules or masses are noted. Upper Abdomen: Aortic atherosclerosis. Small calcified gallstones lying dependently in the gallbladder. Liver has a shrunken appearance and nodular contour, suggestive of underlying cirrhosis. Musculoskeletal: Large amount of subcutaneous emphysema and intramuscular gas in the right chest wall tracking cephalad into the right cervical region. There are no aggressive appearing lytic or blastic lesions noted in the visualized portions of the skeleton. IMPRESSION: 1. Right-sided chest tube noted, with tip of chest tube potentially in the middle mediastinum adjacent to the mid trachea. Possible direct traumatic injury to the trachea noted (although these findings may simply be related to retained secretions in the trachea). Large amount of pneumomediastinum. Moderate volume of residual right pneumothorax. Extensive subcutaneous emphysema, as above. 2. Cardiomegaly with biatrial dilatation. 3. Aortic atherosclerosis, in addition to left main and 3 vessel coronary artery disease. 4. There are calcifications of the aortic valve and mitral annulus. Echocardiographic correlation for evaluation of potential valvular dysfunction may be warranted if clinically indicated. 5. Cholelithiasis. 6. Morphologic changes in the liver suggestive of cirrhosis. Aortic Atherosclerosis (ICD10-I70.0). These results will be called to the ordering clinician or representative by the Radiologist Assistant, and communication documented in the PACS or [employer]. Electronically Signed   By: Vinnie Langton M.D.   On: 12/12/2019 20:33   DG CHEST PORT 1 VIEW  Result Date: 12/12/2019 CLINICAL DATA:  86 year old male with pneumothorax EXAM: PORTABLE CHEST 1 VIEW COMPARISON:  Prior chest x-ray 12/11/2019 FINDINGS: Stable cardiac and mediastinal contours. Atherosclerotic calcifications again noted in the transverse aorta. Perhaps slight  interval progression of trace right apical pneumothorax. The pneumothorax is approximately 5%. Slightly progressive diffuse subcutaneous emphysema within the soft tissues of the right neck and chest wall. Chronic background bronchitic changes and mild bibasilar atelectasis. IMPRESSION: 1. Slight increase in small right apical pneumothorax which remains less than 10%). 2. Increasing subcutaneous emphysema. Electronically Signed   By: Jacqulynn Cadet M.D.   On: 12/12/2019 08:45     Echo LVEF 45-50%, mild LVH, moderately elevated pulmonary artery systolic pressure, severe LAE, moderate MR, moderate to severe TR  TELEMETRY: atrial fibrillation, rate 70s-90s  ASSESSMENT AND PLAN:  Principal Problem:   Pneumothorax on right Active Problems:   Atrial fibrillation with RVR (HCC)   Gout   Benign hypertension   Chronic obstructive pulmonary disease (HCC)   Type 2 diabetes mellitus (HCC)   Acute CHF (congestive heart failure) (HCC)   Hypokalemia   Tension pneumothorax, spontaneous    1. Atrial fibrillation with RVR in the setting of acute respiratory distress, resolved; rate now well controlled. Warfarin has been on hold due to supratherapeutic INR. 2. Tension pneumothorax with pneumomediastium, being transferred to [facility]. 3. Acute on chronic systolic CHF, responded well to IV Lasix, EF 45-50%. 4. CKD 5. COPD  Recommendations: 1. Continue Cardizem CD 240 mg for rate control 2. Resume warfarin after resolution of pneumothorax goal; INR 2-3 3. Continue IV Lasix with careful monitoring of renal status 4. Transfer in process   Clabe Seal, Hershal Coria 12/19/2019 8:14 AM  Sign off for now; please call/[place] with questions.

## 2019-12-13 NOTE — Progress Notes (Signed)
This pleasant gentleman came to CT for pigtail CT placement this afternoon, repeat CT scan much improved. Dr Kathlene Cote contacted Dr Posey Pronto with update given. At this time no CT placement necessary. Report called to Dereck Leep RN with questions answered. Dr Kathlene Cote discussed findings as well in depth with patient in CT, prior to patient returning back to room.

## 2019-12-13 NOTE — Care Management Important Message (Signed)
Important Message  Patient Details  Name: SOCORRO GARBERS MRN: [id number] Date of Birth: 1932-03-20   Medicare Important Message Given:  Other (see comment)  Patient transferring to [place].     Dannette Barbara 12/24/2019, 12:37 PM

## 2019-12-13 NOTE — TOC Initial Note (Signed)
Transition of Care [facility]) - Initial/Assessment Note    Patient Details  Name: [name] Hardin MRN: [id number] Date of Birth: [birth year]  Transition of [facility]) CM/SW Contact:    Candie Chroman, LCSW Phone Number: 01/03/2020, 12:29 PM  Clinical Narrative:  Readmission prevention screen complete. CSW met with patient. Male family member at bedside. CSW introduced role and explained that discharge planning would be discussed. Patient's PCP is Frazier Richards, MD at [facility] Son has been taking him to appts recently. Patient gets his medications as [facility] and [facility]. No issues obtaining medications. Patient was not getting home health services or using DME prior to admission. He is not on oxygen at home although he is on oxygen now. Patient and family member report he uses a cpap machine at home. No further concerns. Patient has orders to transfer to [facility].           Expected Discharge Plan: Home/Self Care Barriers to Discharge: Continued Medical Work up   Patient Goals and CMS Choice     Choice offered to / list presented to : NA  Expected Discharge Plan and Services Expected Discharge Plan: Home/Self Care     Post Acute Care Choice: NA Living arrangements for the past 2 months: Single Family Home Expected Discharge Date: 12/19/2019                                    Prior Living Arrangements/Services Living arrangements for the past 2 months: Single Family Home   Patient language and need for interpreter reviewed:: Yes Do you feel safe going back to the place where you live?: Yes      Need for Family Participation in Patient Care: Yes (Comment) Care giver support system in place?: No (comment)   Criminal Activity/Legal Involvement Pertinent to Current Situation/Hospitalization: No - Comment as needed  Activities of Daily Living Home Assistive Devices/Equipment: Gilford Rile (specify type) ADL Screening (condition at time of  admission) Patient's cognitive ability adequate to safely complete daily activities?: Yes Is the patient deaf or have difficulty hearing?: No Does the patient have difficulty seeing, even when wearing glasses/contacts?: No Does the patient have difficulty concentrating, remembering, or making decisions?: No Patient able to express need for assistance with ADLs?: Yes Does the patient have difficulty dressing or bathing?: No Independently performs ADLs?: Yes (appropriate for developmental age) Does the patient have difficulty walking or climbing stairs?: No Weakness of Legs: None Weakness of Arms/Hands: None  Permission Sought/Granted                  Emotional Assessment Appearance:: Appears stated age Attitude/Demeanor/Rapport: Engaged, Gracious Affect (typically observed): Accepting, Appropriate, Calm, Pleasant Orientation: : Oriented to Self, Oriented to Place, Oriented to  Time, Oriented to Situation Alcohol / Substance Use: Not Applicable Psych Involvement: No (comment)  Admission diagnosis:  Hypokalemia [E87.6] Pneumothorax on right [J93.9] Tension pneumothorax, spontaneous [J93.0] Atrial fibrillation with RVR ([place]) [I48.91] Patient Active Problem List   Diagnosis Date Noted  . Tension pneumothorax, spontaneous   . Pneumothorax on right 12/10/2019  . Acute CHF (congestive heart failure) ([place]) 12/10/2019  . Hypokalemia 12/10/2019  . H/O knee surgery 01/17/2015  . History of knee surgery 01/17/2015  . Body mass index of 60 or higher 04/20/2014  . Morbid (severe) obesity due to excess calories ([place]) 04/20/2014  . Chronic kidney disease (CKD), stage III (moderate) 12/16/2013  .  Atrial flutter ([place]) 12/16/2013  . Gout 12/16/2013  . Benign hypertension 12/16/2013  . Type 2 diabetes mellitus with other specified complication ([place]) 20/35/8136  . Arthritis, degenerative 12/16/2013  . Addison anemia 12/16/2013  . PE (pulmonary embolism) 12/16/2013  . Diabetes mellitus,  type 2 ([place]) 12/16/2013  . Chronic obstructive pulmonary disease ([place]) 12/16/2013  . Pulmonary embolism ([place]) 12/16/2013  . Type 2 diabetes mellitus ([place]) 12/16/2013  . Long term current use of anticoagulant 11/09/2013  . OBESITY, UNSPECIFIED 03/05/2010  . Atrial fibrillation with RVR ([place]) 03/05/2010  . PREMATURE VENTRICULAR CONTRACTIONS 03/05/2010  . COPD 03/05/2010  . SHORTNESS OF BREATH 03/05/2010  . DYSPNEA ON EXERTION 03/05/2010   PCP:  [name], MD Pharmacy:   [facility] [address], [place] [address] Phone: [phone] Fax: [place], [place] [address] [place] [address] Phone: [phone] Fax: [place], [place]. [place] [address] Phone: [phone] Fax: [phone]     Social Determinants of Health (SDOH) Interventions    Readmission Risk Interventions Readmission Risk Prevention Plan 12/27/2019  Transportation Screening Complete  PCP or Specialist Appt within 3-5 Days Complete  Social Work Consult for [place] Planning/Counseling Complete  Palliative Care Screening Not Applicable  Medication Review Press photographer) Complete  Some recent data might be hidden

## 2019-12-13 NOTE — Progress Notes (Addendum)
Notified by Lovena Le, RN that pt stated chest tube chamber is not bubbling anymore. Upon assessment, chamber has stopped bubbling, no output since early this AM. This RN notified MD and PA working with pt. Rapid Response RN from ICU came over and changed chest tube dressing and drainage chamber. Patients facial swelling has progressively gotten worse and unable to see out of right eye. Pt not in any visible distress, VSS.

## 2019-12-13 NOTE — Progress Notes (Signed)
Family at bedside and very concerned about patient. Patients right eye continuously getting worse. Patient has had an alteration in tone of voice. Reached out to [place], [place] due to concern of patients condition. This RN called Building services engineer, Health and safety inspector, and AD [employer]. Evette is speaking with pt now to address patients condition and concerns. Pt scheduled to go down to surgery at 2:30pm and is still on floor.

## 2019-12-13 NOTE — Progress Notes (Signed)
Pt transported to IR 

## 2019-12-13 NOTE — ED Provider Notes (Signed)
[facility] EMERGENCY DEPARTMENT Provider Note   CSN: [id number] Arrival date & time: 12/25/2019  2102     History Chief Complaint  Patient presents with  . Post-op Problem    [name] Hardin is a 86 y.o. male.  HPI   86yo male with history of atrial flutter, chronic PE on coumadin, DM, CVA, htn, COPD not on home O2 presented on Monday to [facility] with shortness of breath and found to have oxygen levels in the 70s and large right pneumothorax thought to be secondary to ruptured bullae who had chest tube placed then findings showing worsening pneumomediastinum with plan for transfer to [facility] hospitalist service with Dr. Servando Snare CT surgery consulting for possible VATS and when transport team arrived to transfer him to bed at [facility] had concern that his chest tube had become dislodged as he had worsening subcutaneous emphysema and transport team did not feel comfortable bringing him to his bed and brought him to ED for reevaluation after discussing with critical care.   Patient and family frustrated by their presence in and wait in the ED and were under the impression that he would have immediate surgery on arrival and be admitted to a room.  [name] Hardin otherwise does not have acute concerns. Denies shortness of breath on his current oxygen. Denies lightheadedness, chest pain. No fever, no cough.          Past Medical History:  Diagnosis Date  . ATRIAL FIBRILLATION 03/05/2010   Qualifier: Diagnosis of  By: Lovena Le, MD, Martyn Malay   . Benign hypertension 12/16/2013  . Cancer ([place])    bladder  . Chronic obstructive pulmonary disease ([place]) 12/16/2013  . COPD (chronic obstructive pulmonary disease) ([place])   . Family history of adverse reaction to anesthesia    Brother has a difficult time waking up.  Marland Kitchen Hx pulmonary embolism 08/2000  . Hypercholesterolemia   . Hyperglycemia   . Hypertension   . Osteoarthritis   . Pre-diabetes   . PREMATURE VENTRICULAR  CONTRACTIONS 03/05/2010   Qualifier: Diagnosis of  By: Lovena Le, MD, Martyn Malay   . Pulmonary embolism ([place]) 12/16/2013  . Stroke [facility]) 1997    Patient Active Problem List   Diagnosis Date Noted  . Pneumothorax, right 12/14/2019  . Acute on chronic systolic CHF (congestive heart failure) ([place]) 12/14/2019  . Acute respiratory failure with hypoxia ([place]) 12/14/2019  . Pressure injury of skin 12/14/2019  . Tension pneumothorax, spontaneous   . Pneumothorax on right 12/10/2019  . Acute CHF (congestive heart failure) ([place]) 12/10/2019  . Hypokalemia 12/10/2019  . H/O knee surgery 01/17/2015  . History of knee surgery 01/17/2015  . Body mass index of 60 or higher 04/20/2014  . Morbid (severe) obesity due to excess calories ([place]) 04/20/2014  . Chronic kidney disease (CKD), stage III (moderate) 12/16/2013  . Atrial flutter ([place]) 12/16/2013  . Gout 12/16/2013  . Benign hypertension 12/16/2013  . Type 2 diabetes mellitus with other specified complication ([place]) [id number]  . Arthritis, degenerative 12/16/2013  . Addison anemia 12/16/2013  . PE (pulmonary embolism) 12/16/2013  . Diabetes mellitus, type 2 ([place]) 12/16/2013  . Chronic obstructive pulmonary disease ([place]) 12/16/2013  . Pulmonary embolism ([place]) 12/16/2013  . Type 2 diabetes mellitus ([place]) 12/16/2013  . Long term current use of anticoagulant 11/09/2013  . OBESITY, UNSPECIFIED 03/05/2010  . Atrial fibrillation with RVR ([place]) 03/05/2010  . PREMATURE VENTRICULAR CONTRACTIONS 03/05/2010  . COPD (chronic obstructive pulmonary disease) ([place]) 03/05/2010  .  SHORTNESS OF BREATH 03/05/2010  . DYSPNEA ON EXERTION 03/05/2010    Past Surgical History:  Procedure Laterality Date  . CYSTOSCOPY W/ RETROGRADES Bilateral 05/18/2017   Procedure: CYSTOSCOPY WITH RETROGRADE PYELOGRAM;  Surgeon: Hollice Espy, MD;  Location: [facility] ORS;  Service: Urology;  Laterality: Bilateral;  . CYSTOSCOPY WITH BIOPSY N/A 05/18/2017   Procedure: CYSTOSCOPY  WITH BIOPSY;  Surgeon: Hollice Espy, MD;  Location: [facility] ORS;  Service: Urology;  Laterality: N/A;  . JOINT REPLACEMENT Right   . KYPHOPLASTY N/A 10/25/2019   Procedure: L2 KYPHOPLASTY;  Surgeon: Hessie Knows, MD;  Location: [facility] ORS;  Service: Orthopedics;  Laterality: N/A;  . PERIPHERAL VASCULAR CATHETERIZATION N/A 02/04/2015   Procedure: IVC Filter Removal;  Surgeon: Katha Cabal, MD;  Location: [place] [facility];  Service: Cardiovascular;  Laterality: N/A;  . TONSILLECTOMY    . URETEROSCOPY Left 05/18/2017   Procedure: URETEROSCOPY;  Surgeon: Hollice Espy, MD;  Location: [facility] ORS;  Service: Urology;  Laterality: Left;       Family History  Problem Relation Age of Onset  . Prostate cancer Neg Hx   . Kidney cancer Neg Hx     Social History   Tobacco Use  . Smoking status: Former Smoker    Quit date: 05/06/1991    Years since quitting: 28.6  . Smokeless tobacco: Never Used  Substance Use Topics  . Alcohol use: No    Alcohol/week: 0.0 standard drinks  . Drug use: No    Home Medications Prior to Admission medications   Medication Sig Start Date End Date Taking? Authorizing Provider  acetaminophen (TYLENOL) 500 MG tablet Take 1,000 mg by mouth daily as needed.    [provider]  allopurinol (ZYLOPRIM) 300 MG tablet Take 300 mg by mouth daily. In am    [provider]  aspirin EC 81 MG tablet Take 81 mg by mouth daily.    [provider]  colchicine (COLCRYS) 0.6 MG tablet Take 0.6 mg by mouth every other day.     [provider]  diltiazem (CARDIZEM CD) 240 MG 24 hr capsule Take 240 mg by mouth daily. In am    [provider]  Fluticasone-Salmeterol (ADVAIR DISKUS) 250-50 MCG/DOSE AEPB Inhale 1 puff into the lungs 2 (two) times daily.      [provider]  Multiple Vitamin (MULTIVITAMIN WITH MINERALS) TABS tablet Take 1 tablet by mouth daily.    [provider]  polyethylene glycol (MIRALAX / GLYCOLAX)  17 g packet Take 17 g by mouth daily. 12/25/2019   Fritzi Mandes, MD  potassium chloride (KLOR-CON) 10 MEQ CR tablet Take 10 mEq by mouth daily. In am    [provider]  simvastatin (ZOCOR) 40 MG tablet Take 40 mg by mouth daily at 6 PM.     [provider]  tiotropium (SPIRIVA HANDIHALER) 18 MCG inhalation capsule Place 18 mcg into inhaler and inhale daily. In am    [provider]  torsemide (DEMADEX) 100 MG tablet Take 100 mg by mouth daily. 11/26/19   [provider]  warfarin (COUMADIN) 5 MG tablet Take 5 mg by mouth daily.    [provider]    Allergies    Patient has no known allergies.  Review of Systems   Review of Systems  Constitutional: Negative for fever.  HENT: Negative for sore throat.   Eyes: Negative for visual disturbance.  Respiratory: Negative for shortness of breath.   Cardiovascular: Negative for chest pain.  Gastrointestinal:  Negative for abdominal pain, nausea and vomiting.  Genitourinary: Negative for difficulty urinating.  Musculoskeletal: Negative for neck stiffness. Neck pain: swelling/subcutaneous air.  Skin: Negative for rash.  Neurological: Negative for syncope and headaches.    Physical Exam Updated Vital Signs BP 115/62 (BP Location: Left Arm)   Pulse 73   Temp 97.9 F (36.6 C)   Resp 19   SpO2 97%   Physical Exam Vitals and nursing note reviewed.  Constitutional:      General: He is not in acute distress.    Appearance: He is well-developed. He is not diaphoretic.  HENT:     Head: Normocephalic.  Eyes:     Conjunctiva/sclera: Conjunctivae normal.  Neck:     Comments: Crepitus extending from chest wall up through neck and face on right Cardiovascular:     Rate and Rhythm: Normal rate and regular rhythm.     Heart sounds: Normal heart sounds. No murmur. No friction rub. No gallop.   Pulmonary:     Effort: Pulmonary effort is normal. No respiratory distress.     Breath sounds: Normal breath  sounds. No wheezing.     Comments: Bilateral breath sounds Subcutaneous air/crackles over right chest Abdominal:     General: There is no distension.     Palpations: Abdomen is soft.     Tenderness: There is no abdominal tenderness. There is no guarding.  Musculoskeletal:     Cervical back: Normal range of motion.  Skin:    General: Skin is warm and dry.  Neurological:     Mental Status: He is alert and oriented to person, place, and time.     ED Results / Procedures / Treatments   Labs (all labs ordered are listed, but only abnormal results are displayed) Labs Reviewed  PROTIME-INR - Abnormal; Notable for the following components:      Result Value   Prothrombin Time 20.1 (*)    INR 1.8 (*)    All other components within normal limits  BASIC METABOLIC PANEL - Abnormal; Notable for the following components:   Glucose, Bld 108 (*)    BUN 29 (*)    Creatinine, Ser 1.36 (*)    GFR calc non Af Amer 47 (*)    GFR calc Af Amer 55 (*)    All other components within normal limits  CBC - Abnormal; Notable for the following components:   RBC 3.56 (*)    Hemoglobin 11.4 (*)    HCT 35.7 (*)    MCV 100.3 (*)    RDW 15.7 (*)    All other components within normal limits  GLUCOSE, CAPILLARY - Abnormal; Notable for the following components:   Glucose-Capillary 119 (*)    All other components within normal limits  HEPARIN LEVEL (UNFRACTIONATED)    EKG None  Radiology DG Chest 1 View  Result Date: 12/30/2019 CLINICAL DATA:  86 year old male with right pneumothorax. EXAM: CHEST  1 VIEW COMPARISON:  Radiograph dated 12/12/2019 and CT dated 12/26/2019 FINDINGS: Evaluation is very limited due to extensive soft tissue emphysema of the right chest wall. A right upper lobe pneumothorax again noted measuring approximately 15 mm in thickness to the pleural surface relatively similar to prior radiograph. Extensive right chest wall soft tissue emphysema extending to the neck. No other interval  change. IMPRESSION: No significant interval change in the appearance of the right upper lobe pneumothorax compared to the prior radiograph. Continued follow-up recommended. Electronically Signed   By: Laren Everts.D.  On: 12/25/2019 20:11   CT Chest Wo Contrast  Result Date: 12/14/2019 CLINICAL DATA:  Dislodged chest tube EXAM: CT CHEST WITHOUT CONTRAST TECHNIQUE: Multidetector CT imaging of the chest was performed following the standard protocol without IV contrast. COMPARISON:  December 13, 2019 4:26 p.m. FINDINGS: Cardiovascular: There is moderate cardiomegaly and a small pericardial effusion. Coronary artery calcifications are seen. Aortic valve and scattered aortic atherosclerosis is noted. Mediastinum/Nodes: Scattered small mediastinal lymph nodes are again noted. The thyroid gland, trachea and esophagus demonstrate no significant findings. Lungs/Pleura: Again seen is a right-sided chest tube located on the posterior superior pleura and lung apex. There appears to be in debris within the pleural chest tube. A small anterior superior right pneumothorax is again noted, not significantly changed since the prior exam. Small bilateral pleural effusions are seen, left greater than right. Layering calcified gallstones are present. Multiple low-density lesions seen throughout the liver. There is mild misty mesentery within the mid abdomen. Musculoskeletal/Chest wall: Again noted is subcutaneous emphysema along the right lateral and anterior chest wall with small overlying hematoma. No acute fracture is noted. IMPRESSION: Right-sided chest tube in unchanged position along the posterosuperior pleural space with a small amount of internal debris. Unchanged small anterior superior right pneumothorax. Extensive unchanged subcutaneous emphysema along the anterolateral chest walls. Small bilateral pleural effusions, left greater than right. Electronically Signed   By: Prudencio Pair M.D.   On: 12/14/2019 01:44   CT  CHEST WO CONTRAST  Result Date: 12/25/2019 CLINICAL DATA:  Spontaneous pneumothorax on right and status post lateral large caliber chest tube placement on 12/10/2019. CT yesterday demonstrated a persistent anterior pneumothorax and extension of the tip of the indwelling chest tube just into the posterior mediastinum. Plan for second smaller caliber chest tube placement to treat the residual component of pneumothorax. EXAM: CT CHEST WITHOUT CONTRAST TECHNIQUE: Multidetector CT imaging of the chest was performed following the standard protocol without IV contrast. COMPARISON:  12/12/2019 FINDINGS: Cardiovascular: Stable cardiac enlargement, trace pericardial fluid and calcified coronary artery plaque. Mediastinum/Nodes: Stable scattered mediastinal lymph nodes, some of which are borderline/mildly enlarged. The largest is in the AP window measuring approximately 13 mm in short axis. Overall volume of pneumomediastinum has decreased slightly since the prior CT yesterday. Lungs/Pleura: The tip of the indwelling right-sided chest tube has now been retracted slightly out of the mediastinum and is now located in the superior and medial aspect of the pleural space near the right apex. There is resultant significant decrease in the residual pneumothorax with only a small anterior pneumothorax remaining which is currently too small to accommodate a chest tube. Stable small left pleural effusion and trace right pleural effusion. Stable lateral blebs adjacent to the right upper lobe and small anterior blebs of the left lung. Musculoskeletal/Soft Tissues: Overall amount of subcutaneous emphysema in the right chest wall, right axilla and extending into the right neck shows slight enlargement since the prior CT yesterday. IMPRESSION: 1. The tip of the indwelling right-sided chest tube has now been retracted slightly out of the mediastinum and is now located in the superior and medial aspect of the pleural space near the right  apex. There is resultant significant decrease in the residual right pneumothorax with only a small anterior pneumothorax remaining which is currently too small to accommodate a chest tube. 2. Overall amount of subcutaneous emphysema in the right chest wall, right axilla and extending into the right neck shows slight enlargement since the prior CT yesterday. 3. Volume of pneumomediastinum shows  slight decreased since the prior CT yesterday. 4. Stable small left pleural effusion and trace right pleural effusion. 5. Stable cardiac enlargement, trace pericardial fluid and calcified coronary artery plaque. 6. Stable borderline/mildly enlarged mediastinal lymph nodes. Electronically Signed   By: Aletta Edouard M.D.   On: 12/30/2019 17:06   CT CHEST W CONTRAST  Result Date: 12/12/2019 CLINICAL DATA:  86 year old male with history of right-sided pneumothorax. EXAM: CT CHEST WITH CONTRAST TECHNIQUE: Multidetector CT imaging of the chest was performed during intravenous contrast administration. CONTRAST:  73mL OMNIPAQUE IOHEXOL 300 MG/ML  SOLN COMPARISON:  Chest CTA 07/25/2017. FINDINGS: Cardiovascular: Heart size is enlarged with biatrial dilatation. There is no significant pericardial fluid, thickening or pericardial calcification. There is aortic atherosclerosis, as well as atherosclerosis of the great vessels of the mediastinum and the coronary arteries, including calcified atherosclerotic plaque in the left main, left anterior descending, left circumflex and right coronary arteries. Calcifications of the aortic valve and mitral annulus. Mediastinum/Nodes: Pneumomediastinum. Multiple prominent borderline enlarged mediastinal and hilar lymph nodes are incidentally noted. Esophagus is unremarkable in appearance. No axillary lymphadenopathy. Lungs/Pleura: Moderate-sized right-sided pneumothorax with right-sided chest tube in position directed into the medial aspect of the right hemithorax. However, the tip of the tube  (visualized on axial image 32 of series 3 and coronal image 94 of series 5) appears to violate the pleura and extend into the middle mediastinum immediately adjacent to the trachea. In this region, there is a focal distortion of the tracheal wall best appreciated on axial image 33 of series 3 and the adjacent images, which could suggest a focal area of tracheal trauma. Trace right and small left pleural effusions lying dependently. No acute consolidative airspace disease. No definite suspicious appearing pulmonary nodules or masses are noted. Upper Abdomen: Aortic atherosclerosis. Small calcified gallstones lying dependently in the gallbladder. Liver has a shrunken appearance and nodular contour, suggestive of underlying cirrhosis. Musculoskeletal: Large amount of subcutaneous emphysema and intramuscular gas in the right chest wall tracking cephalad into the right cervical region. There are no aggressive appearing lytic or blastic lesions noted in the visualized portions of the skeleton. IMPRESSION: 1. Right-sided chest tube noted, with tip of chest tube potentially in the middle mediastinum adjacent to the mid trachea. Possible direct traumatic injury to the trachea noted (although these findings may simply be related to retained secretions in the trachea). Large amount of pneumomediastinum. Moderate volume of residual right pneumothorax. Extensive subcutaneous emphysema, as above. 2. Cardiomegaly with biatrial dilatation. 3. Aortic atherosclerosis, in addition to left main and 3 vessel coronary artery disease. 4. There are calcifications of the aortic valve and mitral annulus. Echocardiographic correlation for evaluation of potential valvular dysfunction may be warranted if clinically indicated. 5. Cholelithiasis. 6. Morphologic changes in the liver suggestive of cirrhosis. Aortic Atherosclerosis (ICD10-I70.0). These results will be called to the ordering clinician or representative by the Radiologist Assistant,  and communication documented in the PACS or [employer]. Electronically Signed   By: Vinnie Langton M.D.   On: 12/12/2019 20:33   DG Chest Portable 1 View  Result Date: 12/22/2019 CLINICAL DATA:  86 year old male with right pneumothorax. EXAM: PORTABLE CHEST 1 VIEW COMPARISON:  Earlier radiograph dated 01/09/2020. FINDINGS: Apparent pleural reflection along the right upper lobe concerning for a small pneumothorax. No significant interval change since the prior radiograph. Evaluation is limited due to overlying soft tissue emphysema. No other interval change. IMPRESSION: No significant interval change since the prior radiograph. Electronically Signed   By: Milas Hock  Radparvar M.D.   On: 12/20/2019 23:22    Procedures Procedures (including critical care time)  Medications Ordered in ED Medications  diltiazem (CARDIZEM CD) 24 hr capsule 240 mg (240 mg Oral Given 12/14/19 0812)  simvastatin (ZOCOR) tablet 40 mg (has no administration in time range)  mometasone-formoterol (DULERA) 200-5 MCG/ACT inhaler 2 puff (2 puffs Inhalation Given 12/14/19 0739)  sodium chloride flush (NS) 0.9 % injection 3 mL (0 mLs Intravenous Duplicate [id number])  sodium chloride flush (NS) 0.9 % injection 3 mL (0 mLs Intravenous Duplicate [id number])  sodium chloride flush (NS) 0.9 % injection 3 mL (has no administration in time range)  0.9 %  sodium chloride infusion (has no administration in time range)  acetaminophen (TYLENOL) tablet 650 mg (650 mg Oral Given 12/14/19 1013)    Or  acetaminophen (TYLENOL) suppository 650 mg ( Rectal See Alternative 12/14/19 1013)  HYDROcodone-acetaminophen (NORCO/VICODIN) 5-325 MG per tablet 1 tablet (has no administration in time range)  morphine 4 MG/ML injection 3 mg (has no administration in time range)  polyethylene glycol (MIRALAX / GLYCOLAX) packet 17 g (has no administration in time range)  ondansetron (ZOFRAN) tablet 4 mg (has no administration in time range)    Or  ondansetron  (ZOFRAN) injection 4 mg (has no administration in time range)  torsemide (DEMADEX) tablet 100 mg (100 mg Oral Given 12/14/19 0813)  umeclidinium bromide (INCRUSE ELLIPTA) 62.5 MCG/INH 1 puff (1 puff Inhalation Given 12/14/19 0738)  heparin ADULT infusion 100 units/mL (25000 units/259mL sodium chloride 0.45%) (1,450 Units/hr Intravenous New Bag/Given 12/14/19 1004)    ED Course  I have reviewed the triage vital signs and the nursing notes.  Pertinent labs & imaging results that were available during my care of the patient were reviewed by me and considered in my medical decision making (see chart for details).    MDM Rules/Calculators/A&P                      86yo male with history of atrial flutter, chronic PE on coumadin, DM, CVA, htn, COPD not on home O2 presented on Monday to [facility] with shortness of breath and found to have oxygen levels in the 70s and large right pneumothorax thought to be secondary to ruptured bullae who had chest tube placed then findings showing worsening pneumomediastinum with concern for tube placed into mediastinum, retraction of tube with decrease in pneumothorax and pneumomediastinum however persistent air leak with plan for transfer to [facility] hospitalist service with Dr. Servando Snare CT surgery consulting for possible VATS and when transport team arrived to transfer him to bed at [facility] cone had concern that his chest tube had become dislodged as he had worsening subcutaneous emphysema and transport team did not feel comfortable bringing him to his bed and brought him to ED for reevaluation after discussing with critical care.   Patient is stable on current oxygen 4L, no tachycardia, no dyspnea, no tachypnea, no hypoxia, no hypotension.  XR repeated in ED given concern for possible dislodged tube per outside hospital notes.  XR does not show worsening of pneumothorax and due to subcutaneous air it is difficult to decipher placement of tube on XR.  Called Dr. Servando Snare of CT  surgery who was not aware of this concern regarding possible removal of the CT.  Given this as a concern and XR difficult to interpret, recommends repeating CT chest and if the tube has been dislodged recommends consulting IR for placement and he will be seeing  patient in the AM.  Discussed plan with Dr. Myna Hidalgo for admission to stepdown unit.   Final Clinical Impression(s) / ED Diagnoses Final diagnoses:  Primary spontaneous pneumothorax  Pneumomediastinum (HCC)  Subcutaneous emphysema, initial encounter [facility])    Rx / DC Orders ED Discharge Orders    None       Gareth Morgan, MD 12/14/19 1231

## 2019-12-13 NOTE — ED Triage Notes (Signed)
Pt comes as a transfer from [facility], dislodged R sided chest tube, subcutaneous emphysema to the R side of body, chest area up to the face.

## 2019-12-13 NOTE — Progress Notes (Signed)
Pt transported back into rm [id number] from IR. This RN called [facility] to let them know pt is ready to be transported to [place]. [facility] stated they would not be able to get a truck to [facility] until "around 6pm". This RN  attempted to give report to RN receiving pt, secretary stated she would call back.

## 2019-12-13 NOTE — Progress Notes (Addendum)
[facility] SURGICAL PROGRESS NOTE (cpt [id number])  Hospital Day(s): 3.   Interval History: Patient seen and examined, no acute events or new complaints overnight. Patient reports he is feeling worse this morning and complains of worsening facial and neck swelling on the right side, denies fever, chills, chest pain. Chest tube continues to have vigorous air leak. Output from chest tube recorded at 600 ccs. CT concerning for chest tube in mediastinum.    Review of Systems:  Constitutional: denies fever, chills  HEENT: denies cough or congestion, + facial swelling Respiratory: denies any shortness of breath  Cardiovascular: denies chest pain or palpitations  Gastrointestinal: denies abdominal pain, N/V, or diarrhea/and bowel function as per interval history Genitourinary: denies burning with urination or urinary frequency  Vital signs in last 24 hours: [min-max] current  Temp:  [98 F (36.7 C)-98.5 F (36.9 C)] 98.1 F (36.7 C) (06/03 0429) Pulse Rate:  [76-86] 82 (06/03 0429) Resp:  [12-20] 18 (06/03 0429) BP: (94-117)/(54-69) 101/65 (06/03 0429) SpO2:  [90 %-97 %] 95 % (06/03 0429) Weight:  [121 kg] 121 kg (06/03 0420)     Height: 5\' 11"  (180.3 cm) Weight: 121 kg BMI (Calculated): 37.23   Intake/Output last 2 shifts:  06/02 0701 - 06/03 0700 In: 120 [P.O.:120] Out: [id number] [Urine:4550; Chest Tube:600]   Physical Exam:  Constitutional: alert, cooperative and no distress  HENT: marked crepitus to the right face, neck, and chest Eyes: PERRL, EOM's grossly intact and symmetric, periobital subcutaneous air Respiratory: breathing non-labored at rest  Cardiovascular: regular rate and sinus rhythm  Chest: Chest tube to the right lateral chest wall, output is serosanguinous, there is a persistent vigorous air leak  Genitourinary: Foley in place Musculoskeletal: no edema or wounds, motor and sensation grossly intact, NT    Labs:  CBC Latest Ref Rng & Units 12/12/2019  12/10/2019 08/07/2014  WBC 4.0 - 10.5 K/uL 12.0(H) 8.4 8.8  Hemoglobin 13.0 - 17.0 g/dL 11.0(L) 11.9(L) 14.7  Hematocrit 39.0 - 52.0 % 34.3(L) 37.0(L) 44.8  Platelets 150 - 400 K/uL 147(L) 149(L) 152   CMP Latest Ref Rng & Units 12/12/2019 12/11/2019 12/10/2019  Glucose 70 - 99 mg/dL 103(H) 120(H) 148(H)  BUN 8 - 23 mg/dL 35(H) 28(H) 25(H)  Creatinine 0.61 - 1.24 mg/dL 1.31(H) 1.33(H) 1.34(H)  Sodium 135 - 145 mmol/L 140 138 140  Potassium 3.5 - 5.1 mmol/L 4.2 4.6 2.9(L)  Chloride 98 - 111 mmol/L 101 98 102  CO2 22 - 32 mmol/L 29 29 25   Calcium 8.9 - 10.3 mg/dL 8.8(L) 8.9 8.9  Total Protein 6.5 - 8.1 g/dL - - 6.7  Total Bilirubin 0.3 - 1.2 mg/dL - - 1.1  Alkaline Phos 38 - 126 U/L - - 101  AST 15 - 41 U/L - - 29  ALT 0 - 44 U/L - - 18     Imaging studies:   CT Chest (12/12/2019) personally reviewed with position of chest tube in the mediastinum with continued subcutaneous emphysema, and radiologist report reviewed:  IMPRESSION: 1. Right-sided chest tube noted, with tip of chest tube potentially in the middle mediastinum adjacent to the mid trachea. Possible direct traumatic injury to the trachea noted (although these findings may simply be related to retained secretions in the trachea). Large amount of pneumomediastinum. Moderate volume of residual right pneumothorax. Extensive subcutaneous emphysema, as above. 2. Cardiomegaly with biatrial dilatation. 3. Aortic atherosclerosis, in addition to left main and 3 vessel coronary artery disease. 4. There are calcifications of the aortic  valve and mitral annulus. Echocardiographic correlation for evaluation of potential valvular dysfunction may be warranted if clinically indicated. 5. Cholelithiasis. 6. Morphologic changes in the liver suggestive of cirrhosis.   Assessment/Plan: (ICD-10's: J93.0) 86 y.o. male with worsening subcutaneous emphysema with persistent air leak on pleurovac admitted with spontaneous right sided tension  pneumothorax s/p chest tube placement on 05/31 likely secondary to ruptured bleb, complicated by pertinent comorbidities including atrial fibrillation on warfarin, COPD, and history of PE    - Given the continues clinical worsening of subcutaneous emphysema, persistent air leak, and positioning concerns of chest tube concomitant with our lack of thoracic surgery resources at this facility, it is in his best interest to be transferred to [facility] to be evaluated by CTS for potential intervention   - Pulmonary toilet             - pain control prn             - monitor chest tube output; continue to suction   All of the above findings and recommendations were discussed with the patient, and the medical team, and all of patient's questions were answered to his expressed satisfaction.  -- Edison Simon, PA-C [facility] 12/19/2019, 7:41 AM [phone] M-F: 7am - 4pm  I saw and evaluated the patient.  I agree with the above documentation, exam, and plan, which I have edited where appropriate. Fredirick Maudlin  9:04 AM

## 2019-12-13 NOTE — Progress Notes (Signed)
[facility] Progress Note Patient Name: [name] Hardin DOB: 24-Jun-1932 MRN: [id number]   Date of Service  12/27/2019  HPI/Events of Note  Recieved a call via [place] about this patient was connected to the transport nurse who endorses that patient's chest tube is dislodged and that subcutaneous air is now all the way to the neck, asking guidance on where patient needs to go, to the room, to the OR or to the ED. When asked where patient was I was informed patient is in the ED.  eICU Interventions  Given potential for airway compromise I gave instruction to have patient stay in the ED for proper evaluation. Called ED and inquired who was on call for [facility] since this is a [facility] patient and I was able to get in touch with Dr Myna Hidalgo and informed him of patient location.     Intervention Category Major Interventions: Airway management  Judd Lien 12/26/2019, 9:28 PM

## 2019-12-13 NOTE — Progress Notes (Signed)
Report given to Angela, RN.

## 2019-12-13 NOTE — Progress Notes (Signed)
Patient without respiratory distress. Notified by transport team concern for chest tube dislodgement.  Stat chest xray viewed by me carelink confirming chest tube out of pleural space . Patient with bilateral breath sounds with crackles and wheezes. SQ emphysema mid lateral thorax across anteriorly to left side of sternum and evident up to lower right eye.  Patient denies problems breathing and sats atble on 3 L [place]. Patient does endorse some difficulty swallowing Discussed with Dr. Hampton Abbot. Patient is stable to transfer to [facility] health with plansfor immediate surgical intervention for chest tube placement

## 2019-12-13 NOTE — ED Notes (Signed)
Patient reports increased comfort of breathing post chest tube insertion

## 2019-12-13 NOTE — Progress Notes (Addendum)
Patient ID: [name] Hardin, male   DOB: 08/06/1931, 86 y.o.   MRN: [id number]  Pt will get a new pigtail catheter placed today by IR here at [facility] since [facility] IR is backed up. Dr Celine Ahr recommneds pt will still need to be transferred since NO CT surgeon is available at [facility] this week.  [name], [name] will inform pt's dter. Pt will transfer to [facility] after new CT is placed.  4;30 pm Spoke with Dr Gerri Lins chest done this after shows smaller PTX than yday on the right side. New CT was not required to be placed however, pt still needs to transfer for possible small air leak that is causing significant subcutaneous emphysema. RN to call care link for transporting to [facility] Above was informed to daughter [name]  in the room. Secure chat message was sent to Dr Servando Snare regarding above update

## 2019-12-13 NOTE — Progress Notes (Signed)
Zach, [place] in pts rm speaking to family. Notified by York Grice that pt will be taken down to IR for pigtail placement. After placement pt will be transported by carelink to [facility].

## 2019-12-13 NOTE — Consult Note (Signed)
[place] for Warfarin  Indication: atrial fibrillation  No Known Allergies  Patient Measurements: Height: 5\' 11"  (180.3 cm) Weight: 121 kg (266 lb 12.8 oz) IBW/kg (Calculated) : 75.3  Vital Signs: Temp: 97.6 F (36.4 C) (06/03 1149) Temp Source: Oral (06/03 1149) BP: 91/55 (06/03 1149) Pulse Rate: 59 (06/03 1149)  Labs: Recent Labs    12/11/19 0454 12/12/19 0428 01/08/2020 0600  HGB  --  11.0*  --   HCT  --  34.3*  --   PLT  --  147*  --   LABPROT 32.9* 30.0* 23.2*  INR 3.4* 3.0* 2.1*  CREATININE 1.33* 1.31*  --     Estimated Creatinine Clearance: 54.6 mL/min (A) (by C-G formula based on SCr of 1.31 mg/dL (H)).   Medical History: Past Medical History:  Diagnosis Date  . ATRIAL FIBRILLATION 03/05/2010   Qualifier: Diagnosis of  By: Lovena Le, MD, Martyn Malay   . Benign hypertension 12/16/2013  . Cancer ([place])    bladder  . Chronic obstructive pulmonary disease ([place]) 12/16/2013  . COPD (chronic obstructive pulmonary disease) ([place])   . Family history of adverse reaction to anesthesia    Brother has a difficult time waking up.  Marland Kitchen Hx pulmonary embolism 08/2000  . Hypercholesterolemia   . Hyperglycemia   . Hypertension   . Osteoarthritis   . Pre-diabetes   . PREMATURE VENTRICULAR CONTRACTIONS 03/05/2010   Qualifier: Diagnosis of  By: Lovena Le, MD, Martyn Malay   . Pulmonary embolism ([place]) 12/16/2013  . Stroke [facility]) 1997    Assessment: Pharmacy consulted for warfarin dosing and monitoring for 86 yo amel admitted with SOB. Patient found to have right pneumothorax. INR on admission 2.9  DDI: Allopurinol - Patient on both PTA  Home Dose: Warfarin 5mg  daily    DATE INR DOSE 5/31 2.9 4 mg  6/1 3.4 HOLD 6/2       3.0       HOLD 6/3       2.1        Goal of Therapy:  INR 2-3 Monitor platelets by anticoagulation protocol: Yes   Plan:  INR therapeutic but declining. Will continue to  hold warfarin for now pending  decision on further procedures. May need to consider starting Heparin drip if INR continues to trend down and warfarin remains on hold. Daily INR ordered. CBC every 3 days at least.  .  Pharmacy will continue to follow and dose based on INR.   Paulina Fusi, PharmD, BCPS 12/30/2019 1:24 PM

## 2019-12-13 NOTE — Discharge Summary (Signed)
[facility] NAME: [name] Hardin    MR#:  [id number]  DATE OF BIRTH:  03-Sep-1931  DATE OF ADMISSION:  12/10/2019 ADMITTING PHYSICIAN: Collier Bullock, MD  DATE OF DISCHARGE: 12/11/2019  PRIMARY CARE PHYSICIAN: [name], MD    ADMISSION DIAGNOSIS:  Hypokalemia [E87.6] Pneumothorax on right [J93.9] Tension pneumothorax, spontaneous [J93.0] Atrial fibrillation with RVR ([place]) [I48.91]  DISCHARGE DIAGNOSIS:  Right apical Tension pneumothorax with mild mediastinal shift status post emergent  right sided chest tube placement Subcutaneous emphysema Abnormal CT chest with left large pneumomediastinum needs further evaluation by CT surgery Afib--chronic (coumadin on hold due to elevated INR) SECONDARY DIAGNOSIS:   Past Medical History:  Diagnosis Date  . ATRIAL FIBRILLATION 03/05/2010   Qualifier: Diagnosis of  By: Lovena Le, MD, Martyn Malay   . Benign hypertension 12/16/2013  . Cancer ([place])    bladder  . Chronic obstructive pulmonary disease ([place]) 12/16/2013  . COPD (chronic obstructive pulmonary disease) ([place])   . Family history of adverse reaction to anesthesia    Brother has a difficult time waking up.  Marland Kitchen Hx pulmonary embolism 08/2000  . Hypercholesterolemia   . Hyperglycemia   . Hypertension   . Osteoarthritis   . Pre-diabetes   . PREMATURE VENTRICULAR CONTRACTIONS 03/05/2010   Qualifier: Diagnosis of  By: Lovena Le, MD, Martyn Malay   . Pulmonary embolism ([place]) 12/16/2013  . Stroke [facility]) 1997    HOSPITAL COURSE:  86 year old male with history of a flutter, chronic PE on Coumadin diabetes mellitus (controlled), history of stroke, hypertension, COPD not on home O2 presented with acute onset of shortness of breath with chest tightness. Patient was found to be hypoxic in the 70s per EMS. He was in respiratory distress in the ED with chest x-ray showing large right pneumothorax with complete atelectasis and mild  left-sided mediastinal shift suggesting tension pneumothorax.  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  -S/p chest tube placement. Air leak present. - Surgery consult appreciated. Recommends air leak should resolve with healing of the rupture and if persistent may need pleurodesis.  -Chest x-ray this morning shows subcutaneous emphysema and 10% increase in apical PTX -No cardiothoracic surgery available this week and if patient needs pleurodesis or VATS will need to be transferred to [facility]. -CT chest done yday shows worsening Pneumomediastinum--Dr Celine Ahr recommends transfer to [facility] care center -Spoke with DR gerhardt ([facility]) Ok to transfer under hospitalist service and CT surgery will consult. -pt is hemodynamically stable at present with his vitals. -Spoke with dter [name] and she is ok with transfer.  Acute respiratory failure with hypoxia (HCC) Secondary to pneumothorax and acute CHF.  -Maintaining sats on 3 L via nasal cannula. received IV diuresis--now changed to po torsemida  Atrial fibrillation with rapid ventricular response (HCC) Currently in sinus. Likely triggered by acute respiratory distress.  -Continue aspirin, Cardizem. Coumadin held for supratherapeutic INR.  -Dosing per pharmacy. -INR 2.1 today  Acute systolic CHF (HCC) -Progressive shortness of breath for past few weeks with torsemide dose increased by PCP. -Lasix was increased to 80 mg bid--changed to Torsemide home dose -Strict I/O Daily weight.  -uop about 9 liters -2D echo with EF of 45 to 50%.  -Continue statin.  -Cardiology consult with Dr Josefa Half noted.  Hypokalemia Replenish with daily potassium. Follow up  Magnesium is 2.4  COPD -Not on home O2.  -Quit smoking over 25 years ago. -No acute exacerbation.  -  Continue home inhaler and nebs.  Type 2 diabetes mellitus ([place]) Controlled. Monitor on sliding scale  coverage  PVCs Potassium replenished.     DVT prophylaxis:Coumadin Code Status:DNR Family Communication: dter [name] on the phone Disposition: to [facility]  Status is: Inpatient  Remains inpatient appropriate because:Inpatient level of care appropriate due to severity of illness. Pneumothorax with chest tube placement, rapid A. fib, acute CHF needing IV diuresis   Dispo: The patient is from:Home Anticipated d/c is [id number] Anticipated d/c date is: >3 days Patient currentlyis not medically stable to d/c. Continues with air leak. May need VATS [facility]  CONSULTS OBTAINED:  Treatment Team:  Fredirick Maudlin, MD Isaias Cowman, MD  DRUG ALLERGIES:  No Known Allergies  DISCHARGE MEDICATIONS:   Allergies as of 01/08/2020   No Known Allergies     Medication List    STOP taking these medications   aspirin EC 81 MG tablet   lactulose 10 GM/15ML solution Commonly known as: CHRONULAC   warfarin 5 MG tablet Commonly known as: COUMADIN     TAKE these medications   Advair Diskus 250-50 MCG/DOSE Aepb Generic drug: Fluticasone-Salmeterol Inhale 1 puff into the lungs 2 (two) times daily.   allopurinol 300 MG tablet Commonly known as: ZYLOPRIM Take 300 mg by mouth daily. In am   Colcrys 0.6 MG tablet Generic drug: colchicine Take 0.6 mg by mouth every other day.   diltiazem 240 MG 24 hr capsule Commonly known as: CARDIZEM CD Take 240 mg by mouth daily. In am   multivitamin with minerals Tabs tablet Take 1 tablet by mouth daily.   polyethylene glycol 17 g packet Commonly known as: MIRALAX / GLYCOLAX Take 17 g by mouth daily.   potassium chloride 10 MEQ CR tablet Commonly known as: KLOR-CON Take 10 mEq by mouth daily. In am   simvastatin 40 MG tablet Commonly known as: ZOCOR Take 40 mg by mouth daily at 6 PM.   Spiriva HandiHaler 18 MCG inhalation capsule Generic drug:  tiotropium Place 18 mcg into inhaler and inhale daily. In am   torsemide 100 MG tablet Commonly known as: DEMADEX Take 100 mg by mouth daily.       If you experience worsening of your admission symptoms, develop shortness of breath, life threatening emergency, suicidal or homicidal thoughts you must seek medical attention immediately by calling 911 or calling your MD immediately  if symptoms less severe.  You Must read complete instructions/literature along with all the possible adverse reactions/side effects for all the Medicines you take and that have been prescribed to you. Take any new Medicines after you have completely understood and accept all the possible adverse reactions/side effects.   Please note  You were cared for by a hospitalist during your hospital stay. If you have any questions about your discharge medications or the care you received while you were in the hospital after you are discharged, you can call the unit and asked to speak with the hospitalist on call if the hospitalist that took care of you is not available. Once you are discharged, your primary care physician will handle any further medical issues. Please note that NO REFILLS for any discharge medications will be authorized once you are discharged, as it is imperative that you return to your primary care physician (or establish a relationship with a primary care physician if you do not have one) for your aftercare needs so that they can reassess your need for medications and monitor your  lab values. Today   SUBJECTIVE   No cp but sob with exertion No fever  VITAL SIGNS:  Blood pressure 119/65, pulse 76, temperature 97.8 F (36.6 C), temperature source Oral, resp. rate 18, height 5\' 11"  (1.803 m), weight 121 kg, SpO2 95 %.  I/O:    Intake/Output Summary (Last 24 hours) at 12/23/2019 0843 Last data filed at 12/25/2019 0620 Gross per 24 hour  Intake 120 ml  Output 5150 ml  Net -5030 ml    PHYSICAL  EXAMINATION:  GENERAL:  86 y.o.-year-old patient lying in the bed with no acute distress. obese EYES: Pupils equal, round, reactive to light and accommodation. No scleral icterus.  HEENT: Head atraumatic, normocephalic. Oropharynx and nasopharynx clear. Swelling of right eye and right side of the neck and upper chest  NECK:  Supple, no jugular venous distention. No thyroid enlargement, no tenderness.  LUNGS: Normal breath sounds bilaterally, no wheezing, rales,rhonchi or crepitation. No use of accessory muscles of respiration. right CT + CARDIOVASCULAR: S1, S2 normal. No murmurs, rubs, or gallops.  ABDOMEN: Soft, non-tender, non-distended. Bowel sounds present. No organomegaly or mass.  EXTREMITIES: No pedal edema, cyanosis, or clubbing.  NEUROLOGIC: Cranial nerves II through XII are intact. Muscle strength 5/5 in all extremities. Sensation intact. Gait not checked.  PSYCHIATRIC: The patient is alert and oriented x 3.  SKIN: No obvious rash, lesion, or ulcer.   DATA REVIEW:   CBC  Recent Labs  Lab 12/12/19 0428  WBC 12.0*  HGB 11.0*  HCT 34.3*  PLT 147*    Chemistries  Recent Labs  Lab 12/10/19 0503 12/10/19 0503 12/11/19 0454 12/11/19 0454 12/12/19 0428  NA 140   < > 138   < > 140  K 2.9*   < > 4.6   < > 4.2  CL 102   < > 98   < > 101  CO2 25   < > 29   < > 29  GLUCOSE 148*   < > 120*   < > 103*  BUN 25*   < > 28*   < > 35*  CREATININE 1.34*   < > 1.33*   < > 1.31*  CALCIUM 8.9   < > 8.9   < > 8.8*  MG  --   --  2.4  --   --   AST 29  --   --   --   --   ALT 18  --   --   --   --   ALKPHOS 101  --   --   --   --   BILITOT 1.1  --   --   --   --    < > = values in this interval not displayed.    Microbiology Results   Recent Results (from the past 240 hour(s))  SARS Coronavirus 2 by RT PCR (hospital order, performed in [facility] hospital lab) Nasopharyngeal Nasopharyngeal Swab     Status: None   Collection Time: 12/10/19  5:05 AM   Specimen: Nasopharyngeal  Swab  Result Value Ref Range Status   SARS Coronavirus 2 NEGATIVE NEGATIVE Final    Comment: (NOTE) SARS-CoV-2 target nucleic acids are NOT DETECTED. The SARS-CoV-2 RNA is generally detectable in upper and lower respiratory specimens during the acute phase of infection. The lowest concentration of SARS-CoV-2 viral copies this assay can detect is 250 copies / mL. A negative result does not preclude SARS-CoV-2 infection and should not be used as the  sole basis for treatment or other patient management decisions.  A negative result may occur with improper specimen collection / handling, submission of specimen other than nasopharyngeal swab, presence of viral mutation(s) within the areas targeted by this assay, and inadequate number of viral copies (<250 copies / mL). A negative result must be combined with clinical observations, patient history, and epidemiological information. Fact Sheet for Patients:   StrictlyIdeas.no Fact Sheet for Healthcare Providers: BankingDealers.co.za This test is not yet approved or cleared  by the [place] FDA and has been authorized for detection and/or diagnosis of SARS-CoV-2 by FDA under an Emergency Use Authorization (EUA).  This EUA will remain in effect (meaning this test can be used) for the duration of the COVID-19 declaration under Section 564(b)(1) of the Act, 21 U.S.C. section 360bbb-3(b)(1), unless the authorization is terminated or revoked sooner. Performed at [facility], [address] [place], [place] [address]     RADIOLOGY:  CT CHEST W CONTRAST  Result Date: 12/12/2019 CLINICAL DATA:  86 year old male with history of right-sided pneumothorax. EXAM: CT CHEST WITH CONTRAST TECHNIQUE: Multidetector CT imaging of the chest was performed during intravenous contrast administration. CONTRAST:  64mL OMNIPAQUE IOHEXOL 300 MG/ML  SOLN COMPARISON:  Chest CTA 07/25/2017. FINDINGS:  Cardiovascular: Heart size is enlarged with biatrial dilatation. There is no significant pericardial fluid, thickening or pericardial calcification. There is aortic atherosclerosis, as well as atherosclerosis of the great vessels of the mediastinum and the coronary arteries, including calcified atherosclerotic plaque in the left main, left anterior descending, left circumflex and right coronary arteries. Calcifications of the aortic valve and mitral annulus. Mediastinum/Nodes: Pneumomediastinum. Multiple prominent borderline enlarged mediastinal and hilar lymph nodes are incidentally noted. Esophagus is unremarkable in appearance. No axillary lymphadenopathy. Lungs/Pleura: Moderate-sized right-sided pneumothorax with right-sided chest tube in position directed into the medial aspect of the right hemithorax. However, the tip of the tube (visualized on axial image 32 of series 3 and coronal image 94 of series 5) appears to violate the pleura and extend into the middle mediastinum immediately adjacent to the trachea. In this region, there is a focal distortion of the tracheal wall best appreciated on axial image 33 of series 3 and the adjacent images, which could suggest a focal area of tracheal trauma. Trace right and small left pleural effusions lying dependently. No acute consolidative airspace disease. No definite suspicious appearing pulmonary nodules or masses are noted. Upper Abdomen: Aortic atherosclerosis. Small calcified gallstones lying dependently in the gallbladder. Liver has a shrunken appearance and nodular contour, suggestive of underlying cirrhosis. Musculoskeletal: Large amount of subcutaneous emphysema and intramuscular gas in the right chest wall tracking cephalad into the right cervical region. There are no aggressive appearing lytic or blastic lesions noted in the visualized portions of the skeleton. IMPRESSION: 1. Right-sided chest tube noted, with tip of chest tube potentially in the middle  mediastinum adjacent to the mid trachea. Possible direct traumatic injury to the trachea noted (although these findings may simply be related to retained secretions in the trachea). Large amount of pneumomediastinum. Moderate volume of residual right pneumothorax. Extensive subcutaneous emphysema, as above. 2. Cardiomegaly with biatrial dilatation. 3. Aortic atherosclerosis, in addition to left main and 3 vessel coronary artery disease. 4. There are calcifications of the aortic valve and mitral annulus. Echocardiographic correlation for evaluation of potential valvular dysfunction may be warranted if clinically indicated. 5. Cholelithiasis. 6. Morphologic changes in the liver suggestive of cirrhosis. Aortic Atherosclerosis (ICD10-I70.0). These results will be called to the ordering  clinician or representative by the Radiologist Assistant, and communication documented in the PACS or [employer]. Electronically Signed   By: Vinnie Langton M.D.   On: 12/12/2019 20:33   DG CHEST PORT 1 VIEW  Result Date: 12/12/2019 CLINICAL DATA:  86 year old male with pneumothorax EXAM: PORTABLE CHEST 1 VIEW COMPARISON:  Prior chest x-ray 12/11/2019 FINDINGS: Stable cardiac and mediastinal contours. Atherosclerotic calcifications again noted in the transverse aorta. Perhaps slight interval progression of trace right apical pneumothorax. The pneumothorax is approximately 5%. Slightly progressive diffuse subcutaneous emphysema within the soft tissues of the right neck and chest wall. Chronic background bronchitic changes and mild bibasilar atelectasis. IMPRESSION: 1. Slight increase in small right apical pneumothorax which remains less than 10%). 2. Increasing subcutaneous emphysema. Electronically Signed   By: Jacqulynn Cadet M.D.   On: 12/12/2019 08:45     CODE STATUS:     Code Status Orders  (From admission, onward)         Start     Ordered   12/10/19 0916  Do not attempt resuscitation (DNR)  Continuous     Question Answer Comment  In the event of cardiac or respiratory ARREST Do not call a "code blue"   In the event of cardiac or respiratory ARREST Do not perform Intubation, CPR, defibrillation or ACLS   In the event of cardiac or respiratory ARREST Use medication by any route, position, wound care, and other measures to relive pain and suffering. May use oxygen, suction and manual treatment of airway obstruction as needed for comfort.   Comments Code status was discussed with patient and his daughter at the bedside and he is a DNR      12/10/19 0917        Code Status History    This patient has a current code status but no historical code status.   Advance Care Planning Activity    Advance Directive Documentation     Most Recent Value  Type of Advance Directive  Living will  Pre-existing out of facility DNR order (yellow form or pink MOST form)  --  "MOST" Form in Place?  --       TOTAL TIME TAKING CARE OF THIS PATIENT: *40* minutes.    Fritzi Mandes M.D  [facility]    CC: Primary care physician; [name], MD

## 2019-12-13 NOTE — Progress Notes (Signed)
This RN gave report to Andee Poles, RN receiving patient on [address] at [employer] [facility].
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
## 2019-12-13 NOTE — Progress Notes (Signed)
PT Cancellation Note  Patient Details Name: NEERAV HILBORN MRN: [id number] DOB: 05/28/1932   Cancelled Treatment:    Reason Eval/Treat Not Completed: Other (comment).  PT consult received.  Chart reviewed.  Per MD note, plan to transfer pt to [facility].  D/t medical concerns, will hold PT at this time and will monitor pt's status.  Leitha Bleak, PT 12/12/2019, 10:58 AM

## 2019-12-14 ENCOUNTER — Encounter (HOSPITAL_COMMUNITY): Payer: Self-pay | Admitting: Family Medicine

## 2019-12-14 ENCOUNTER — Inpatient Hospital Stay (HOSPITAL_COMMUNITY): Payer: Medicare Other

## 2019-12-14 DIAGNOSIS — Z79899 Other long term (current) drug therapy: Secondary | ICD-10-CM | POA: Diagnosis not present

## 2019-12-14 DIAGNOSIS — Z6837 Body mass index (BMI) 37.0-37.9, adult: Secondary | ICD-10-CM | POA: Diagnosis not present

## 2019-12-14 DIAGNOSIS — J449 Chronic obstructive pulmonary disease, unspecified: Secondary | ICD-10-CM

## 2019-12-14 DIAGNOSIS — L8990 Pressure ulcer of unspecified site, unspecified stage: Secondary | ICD-10-CM | POA: Insufficient documentation

## 2019-12-14 DIAGNOSIS — E669 Obesity, unspecified: Secondary | ICD-10-CM | POA: Diagnosis present

## 2019-12-14 DIAGNOSIS — I5023 Acute on chronic systolic (congestive) heart failure: Secondary | ICD-10-CM | POA: Diagnosis present

## 2019-12-14 DIAGNOSIS — I130 Hypertensive heart and chronic kidney disease with heart failure and stage 1 through stage 4 chronic kidney disease, or unspecified chronic kidney disease: Secondary | ICD-10-CM | POA: Diagnosis present

## 2019-12-14 DIAGNOSIS — N1831 Chronic kidney disease, stage 3a: Secondary | ICD-10-CM | POA: Diagnosis present

## 2019-12-14 DIAGNOSIS — N179 Acute kidney failure, unspecified: Secondary | ICD-10-CM | POA: Diagnosis present

## 2019-12-14 DIAGNOSIS — Z87891 Personal history of nicotine dependence: Secondary | ICD-10-CM | POA: Diagnosis not present

## 2019-12-14 DIAGNOSIS — J9601 Acute respiratory failure with hypoxia: Secondary | ICD-10-CM | POA: Diagnosis present

## 2019-12-14 DIAGNOSIS — J939 Pneumothorax, unspecified: Secondary | ICD-10-CM | POA: Diagnosis present

## 2019-12-14 DIAGNOSIS — Z7982 Long term (current) use of aspirin: Secondary | ICD-10-CM | POA: Diagnosis not present

## 2019-12-14 DIAGNOSIS — J9311 Primary spontaneous pneumothorax: Secondary | ICD-10-CM | POA: Diagnosis present

## 2019-12-14 DIAGNOSIS — Z66 Do not resuscitate: Secondary | ICD-10-CM | POA: Diagnosis present

## 2019-12-14 DIAGNOSIS — Z515 Encounter for palliative care: Secondary | ICD-10-CM | POA: Diagnosis not present

## 2019-12-14 DIAGNOSIS — J189 Pneumonia, unspecified organism: Secondary | ICD-10-CM | POA: Diagnosis present

## 2019-12-14 DIAGNOSIS — Z8673 Personal history of transient ischemic attack (TIA), and cerebral infarction without residual deficits: Secondary | ICD-10-CM | POA: Diagnosis not present

## 2019-12-14 DIAGNOSIS — E1122 Type 2 diabetes mellitus with diabetic chronic kidney disease: Secondary | ICD-10-CM | POA: Diagnosis present

## 2019-12-14 DIAGNOSIS — I480 Paroxysmal atrial fibrillation: Secondary | ICD-10-CM

## 2019-12-14 DIAGNOSIS — M1990 Unspecified osteoarthritis, unspecified site: Secondary | ICD-10-CM | POA: Diagnosis present

## 2019-12-14 DIAGNOSIS — I2782 Chronic pulmonary embolism: Secondary | ICD-10-CM | POA: Diagnosis present

## 2019-12-14 DIAGNOSIS — Y828 Other medical devices associated with adverse incidents: Secondary | ICD-10-CM | POA: Diagnosis not present

## 2019-12-14 DIAGNOSIS — Z7901 Long term (current) use of anticoagulants: Secondary | ICD-10-CM | POA: Diagnosis not present

## 2019-12-14 DIAGNOSIS — Z20822 Contact with and (suspected) exposure to covid-19: Secondary | ICD-10-CM | POA: Diagnosis present

## 2019-12-14 DIAGNOSIS — H02843 Edema of right eye, unspecified eyelid: Secondary | ICD-10-CM | POA: Diagnosis present

## 2019-12-14 DIAGNOSIS — I452 Bifascicular block: Secondary | ICD-10-CM | POA: Diagnosis not present

## 2019-12-14 DIAGNOSIS — Y838 Other surgical procedures as the cause of abnormal reaction of the patient, or of later complication, without mention of misadventure at the time of the procedure: Secondary | ICD-10-CM | POA: Diagnosis not present

## 2019-12-14 DIAGNOSIS — I4892 Unspecified atrial flutter: Secondary | ICD-10-CM | POA: Diagnosis present

## 2019-12-14 DIAGNOSIS — J942 Hemothorax: Secondary | ICD-10-CM | POA: Diagnosis not present

## 2019-12-14 LAB — CBC
HCT: 35.7 % — ABNORMAL LOW (ref 39.0–52.0)
Hemoglobin: 11.4 g/dL — ABNORMAL LOW (ref 13.0–17.0)
MCH: 32 pg (ref 26.0–34.0)
MCHC: 31.9 g/dL (ref 30.0–36.0)
MCV: 100.3 fL — ABNORMAL HIGH (ref 80.0–100.0)
Platelets: 161 10*3/uL (ref 150–400)
RBC: 3.56 MIL/uL — ABNORMAL LOW (ref 4.22–5.81)
RDW: 15.7 % — ABNORMAL HIGH (ref 11.5–15.5)
WBC: 10 10*3/uL (ref 4.0–10.5)
nRBC: 0 % (ref 0.0–0.2)

## 2019-12-14 LAB — BASIC METABOLIC PANEL
Anion gap: 11 (ref 5–15)
BUN: 29 mg/dL — ABNORMAL HIGH (ref 8–23)
CO2: 26 mmol/L (ref 22–32)
Calcium: 8.9 mg/dL (ref 8.9–10.3)
Chloride: 101 mmol/L (ref 98–111)
Creatinine, Ser: 1.36 mg/dL — ABNORMAL HIGH (ref 0.61–1.24)
GFR calc Af Amer: 55 mL/min — ABNORMAL LOW (ref >60)
GFR calc non Af Amer: 47 mL/min — ABNORMAL LOW (ref >60)
Glucose, Bld: 108 mg/dL — ABNORMAL HIGH (ref 70–99)
Potassium: 4.3 mmol/L (ref 3.5–5.1)
Sodium: 138 mmol/L (ref 135–145)

## 2019-12-14 LAB — HEPARIN LEVEL (UNFRACTIONATED): Heparin Unfractionated: 0.3 IU/mL (ref 0.30–0.70)

## 2019-12-14 LAB — PROTIME-INR
INR: 1.8 — ABNORMAL HIGH (ref 0.8–1.2)
Prothrombin Time: 20.1 seconds — ABNORMAL HIGH (ref 11.4–15.2)

## 2019-12-14 LAB — GLUCOSE, CAPILLARY
Glucose-Capillary: 119 mg/dL — ABNORMAL HIGH (ref 70–99)
Glucose-Capillary: 125 mg/dL — ABNORMAL HIGH (ref 70–99)

## 2019-12-14 MED ORDER — DILTIAZEM HCL ER COATED BEADS 240 MG PO CP24
240.0000 mg | ORAL_CAPSULE | Freq: Every day | ORAL | Status: DC
  Administered 2019-12-14: 240 mg via ORAL
  Filled 2019-12-14 (×3): qty 1

## 2019-12-14 MED ORDER — ACETAMINOPHEN 325 MG PO TABS
650.0000 mg | ORAL_TABLET | Freq: Four times a day (QID) | ORAL | Status: DC | PRN
  Administered 2019-12-14 – 2019-12-15 (×3): 650 mg via ORAL
  Filled 2019-12-14 (×3): qty 2

## 2019-12-14 MED ORDER — SODIUM CHLORIDE 0.9% FLUSH: 3.0000 mL | INTRAVENOUS | Status: DC | PRN

## 2019-12-14 MED ORDER — FUROSEMIDE 10 MG/ML IJ SOLN: 40.0000 mg | Freq: Two times a day (BID) | INTRAMUSCULAR | Status: DC

## 2019-12-14 MED ORDER — MOMETASONE FURO-FORMOTEROL FUM 200-5 MCG/ACT IN AERO
2.0000 | INHALATION_SPRAY | Freq: Two times a day (BID) | RESPIRATORY_TRACT | Status: DC
  Administered 2019-12-14 – 2019-12-16 (×5): 2 via RESPIRATORY_TRACT
  Filled 2019-12-14: qty 8.8

## 2019-12-14 MED ORDER — SODIUM CHLORIDE 0.9% FLUSH
3.0000 mL | Freq: Two times a day (BID) | INTRAVENOUS | Status: DC
  Administered 2019-12-14 – 2019-12-16 (×3): 3 mL via INTRAVENOUS

## 2019-12-14 MED ORDER — HYDROCODONE-ACETAMINOPHEN 5-325 MG PO TABS
1.0000 | ORAL_TABLET | ORAL | Status: DC | PRN
  Administered 2019-12-15 (×2): 1 via ORAL
  Filled 2019-12-14 (×2): qty 1

## 2019-12-14 MED ORDER — TIOTROPIUM BROMIDE MONOHYDRATE 18 MCG IN CAPS
18.0000 ug | ORAL_CAPSULE | Freq: Every day | RESPIRATORY_TRACT | Status: DC
  Filled 2019-12-14: qty 5

## 2019-12-14 MED ORDER — POLYETHYLENE GLYCOL 3350 17 G PO PACK
17.0000 g | PACK | Freq: Every day | ORAL | Status: DC
  Administered 2019-12-14: 17 g via ORAL
  Filled 2019-12-14: qty 1

## 2019-12-14 MED ORDER — BISACODYL 10 MG RE SUPP
10.0000 mg | Freq: Once | RECTAL | Status: DC
  Filled 2019-12-14: qty 1

## 2019-12-14 MED ORDER — MORPHINE SULFATE (PF) 4 MG/ML IV SOLN
3.0000 mg | INTRAVENOUS | Status: DC | PRN
  Administered 2019-12-16: 4 mg via INTRAVENOUS
  Administered 2019-12-16: 3 mg via INTRAVENOUS
  Filled 2019-12-14: qty 1

## 2019-12-14 MED ORDER — ACETAMINOPHEN 650 MG RE SUPP: 650.0000 mg | Freq: Four times a day (QID) | RECTAL | Status: DC | PRN

## 2019-12-14 MED ORDER — UMECLIDINIUM BROMIDE 62.5 MCG/INH IN AEPB
1.0000 | INHALATION_SPRAY | Freq: Every day | RESPIRATORY_TRACT | Status: DC
  Administered 2019-12-14 – 2019-12-16 (×3): 1 via RESPIRATORY_TRACT
  Filled 2019-12-14: qty 7

## 2019-12-14 MED ORDER — SENNOSIDES-DOCUSATE SODIUM 8.6-50 MG PO TABS
1.0000 | ORAL_TABLET | Freq: Two times a day (BID) | ORAL | Status: DC
  Administered 2019-12-14 – 2019-12-15 (×3): 1 via ORAL
  Filled 2019-12-14 (×3): qty 1

## 2019-12-14 MED ORDER — HEPARIN (PORCINE) 25000 UT/250ML-% IV SOLN
1450.0000 [IU]/h | INTRAVENOUS | Status: DC
  Administered 2019-12-14 – 2019-12-15 (×3): 1450 [IU]/h via INTRAVENOUS
  Filled 2019-12-14 (×3): qty 250

## 2019-12-14 MED ORDER — POLYETHYLENE GLYCOL 3350 17 G PO PACK: 17.0000 g | PACK | Freq: Every day | ORAL | Status: DC | PRN

## 2019-12-14 MED ORDER — SIMVASTATIN 20 MG PO TABS
40.0000 mg | ORAL_TABLET | Freq: Every day | ORAL | Status: DC
  Administered 2019-12-14 – 2019-12-15 (×2): 40 mg via ORAL
  Filled 2019-12-14 (×3): qty 2

## 2019-12-14 MED ORDER — ONDANSETRON HCL 4 MG/2ML IJ SOLN: 4.0000 mg | Freq: Four times a day (QID) | INTRAMUSCULAR | Status: DC | PRN

## 2019-12-14 MED ORDER — ONDANSETRON HCL 4 MG PO TABS: 4.0000 mg | ORAL_TABLET | Freq: Four times a day (QID) | ORAL | Status: DC | PRN

## 2019-12-14 MED ORDER — SODIUM CHLORIDE 0.9% FLUSH
3.0000 mL | Freq: Two times a day (BID) | INTRAVENOUS | Status: DC
  Administered 2019-12-15 – 2019-12-16 (×2): 3 mL via INTRAVENOUS

## 2019-12-14 MED ORDER — SODIUM CHLORIDE 0.9 % IV SOLN: 250.0000 mL | INTRAVENOUS | Status: DC | PRN

## 2019-12-14 MED ORDER — TORSEMIDE 20 MG PO TABS
100.0000 mg | ORAL_TABLET | Freq: Every day | ORAL | Status: DC
  Administered 2019-12-14 – 2019-12-15 (×2): 100 mg via ORAL
  Filled 2019-12-14 (×2): qty 5

## 2019-12-14 NOTE — ED Notes (Signed)
Dr. Opyd at bedside  

## 2019-12-14 NOTE — Progress Notes (Signed)
ANTICOAGULATION CONSULT NOTE - Initial Consult  Pharmacy Consult for heparin Indication: atrial fibrillation  No Known Allergies  Patient Measurements:   Heparin Dosing Weight: 102kg  Vital Signs: Temp: 98.7 F (37.1 C) (06/04 0721) Temp Source: Oral (06/04 0144) BP: 118/72 (06/04 0721) Pulse Rate: 66 (06/04 0721)  Labs: Recent Labs    12/12/19 0428 12/27/2019 0600 12/14/19 0354  HGB 11.0*  --  11.4*  HCT 34.3*  --  35.7*  PLT 147*  --  161  LABPROT 30.0* 23.2* 20.1*  INR 3.0* 2.1* 1.8*  CREATININE 1.31*  --  1.36*    Estimated Creatinine Clearance: 52.6 mL/min (A) (by C-G formula based on SCr of 1.36 mg/dL (H)).   Medical History: Past Medical History:  Diagnosis Date  . ATRIAL FIBRILLATION 03/05/2010   Qualifier: Diagnosis of  By: Lovena Le, MD, Martyn Malay   . Benign hypertension 12/16/2013  . Cancer ([place])    bladder  . Chronic obstructive pulmonary disease ([place]) 12/16/2013  . COPD (chronic obstructive pulmonary disease) ([place])   . Family history of adverse reaction to anesthesia    Brother has a difficult time waking up.  Marland Kitchen Hx pulmonary embolism 08/2000  . Hypercholesterolemia   . Hyperglycemia   . Hypertension   . Osteoarthritis   . Pre-diabetes   . PREMATURE VENTRICULAR CONTRACTIONS 03/05/2010   Qualifier: Diagnosis of  By: Lovena Le, MD, Martyn Malay   . Pulmonary embolism ([place]) 12/16/2013  . Stroke [facility]) 1997    Assessment: 14 YOM transfer from [facility] with pneumothorax/chest tube, on warfarin PTA for Afib/hx PE, warfarin held for possible procedure.  INR today slightly subtherapeutic at 1.8 and pharmacy consulted to initiate heparin.  H/H low but stable, plts 161.    Goal of Therapy:  Heparin level 0.3-0.7 units/ml Monitor platelets by anticoagulation protocol: Yes   Plan:  Heparin gtt at 1450 units/hr, no bolus F/u 8 hour heparin level  Bertis Ruddy, PharmD Clinical Pharmacist Please check AMION for all [place] numbers 12/14/2019 8:23  AM

## 2019-12-14 NOTE — Progress Notes (Signed)
PROGRESS NOTE    [name] Hardin  GQQ:[id number] DOB: 09-12-1931 DOA: 12/20/2019 PCP: [name], MD   Brief Narrative: Patient is 86 year old male with history of COPD, proximal A. fib, PE on Coumadin, chronic systolic congestive heart failure, hypertension who presented to the emergency department at [facility] on 12/10/2019 with acute onset of shortness of breath and chest discomfort. On presentation he was saturating in the 80s on room air. He was initially treated for COPD exacerbation. He was also in A. fib with RVR. Imaging showed right-sided pneumothorax with concern for tension component and chest tube was placed. Respiratory status improved significantly after chest tube placement, oxygen weaned down to 2 L/min. Repeat imaging demonstrated slight increase in pneumothorax, subcutaneous emphysema so he was transferred to [name] for cardiothoracic surgery evaluation. There is also concern for chest tube displacement.  Assessment & Plan:   Principal Problem:   Pneumothorax, right Active Problems:   Atrial fibrillation with RVR (HCC)   COPD (chronic obstructive pulmonary disease) (HCC)   Chronic kidney disease (CKD), stage III (moderate)   Diabetes mellitus, type 2 (HCC)   Acute on chronic systolic CHF (congestive heart failure) (HCC)   Acute respiratory failure with hypoxia (HCC)   Pressure injury of skin   Acute hypoxic respiratory failure: Secondary to pneumothorax. This morning he was on room air.  Spontaneous pneumothorax: Presented with shortness of breath and chest discomfort. Imaging showed right-sided pneumothorax with concern for tension component. [facility] at [facility]. He has soft tissue emphysema. There was concern for chest tube displacement so he was sent to [name] for cardiothoracic surgery evaluation. Cardiothoracic surgery following. Continue chest tube to suction, pain control, supportive care  A. fib with RVR: Required IV  Cardizem for rate control, now maintaining rate on oral Cardizem. CHADS-VASc is at least 72 (age x2, CAD, CHF) . On warfarin for anticoagulation at home. Continue IV heparin here.  Acute on chronic systolic congestive heart failure: Currently compensated. Was treated with IV Lasix. Home torsemide resumed. Continue to monitor daily weight, ins/outs  COPD: There was concern for COPD exacerbation on presentation. Currently stable. No wheezing continue home inhalers.  CKD stage IIIa: Currently kidney function at baseline.  Coronary artery disease: No anginal complaints. Continue statin and ASA          DVT prophylaxis: IV heparin Code Status: DNR Family Communication:  Status is: Inpatient  Remains inpatient appropriate because:IV treatments appropriate due to intensity of illness or inability to take PO   Dispo: The patient is from: Home              Anticipated d/c is to: Home              Anticipated d/c date is: > 3 days              Patient currently is not medically stable to d/c.    Consultants: Cardiothoracic surgery  Procedures: Chest tube placement  Antimicrobials:  Anti-infectives (From admission, onward)   None      Subjective:  Patient seen and examined at bedside this morning.  Hemodynamically stable.  On room air.  Not in any kind of distress.  Denies any chest pain or shortness of breath.Very eager to go home Objective: Vitals:   12/14/19 0045 12/14/19 0144 12/14/19 0721 12/14/19 0739  BP: 104/73 104/61 118/72   Pulse: 79 77 66   Resp: 14 18 19    Temp:  97.8 F (36.6 C)  98.7 F (37.1 C)   TempSrc:  Oral    SpO2: 93% 92% 96% 97%    Intake/Output Summary (Last 24 hours) at 12/14/2019 0755 Last data filed at 12/14/2019 0327 Gross per 24 hour  Intake 0 ml  Output 900 ml  Net -900 ml   There were no vitals filed for this visit.  Examination:  General exam: Appears calm and comfortable ,Not in distress,elderly obese male HEENT:PERRL,Oral mucosa  moist, Ear/Nose normal on gross exam Respiratory system: Slight decreased air entry on the right side,right sided chest tube  cardiovascular system: Irregularly irregular rhythm, no JVD, murmurs, rubs, gallops or clicks. No pedal edema. Gastrointestinal system: Abdomen is nondistended, soft and nontender. No organomegaly or masses felt. Normal bowel sounds heard. Central nervous system: Alert and oriented. No focal neurological deficits. Extremities: No edema, no clubbing ,no cyanosis Skin: No rashes, lesions or ulcers,no icterus ,no pallor  Data Reviewed: I have personally reviewed following labs and imaging studies  CBC: Recent Labs  Lab 12/10/19 0503 12/12/19 0428 12/14/19 0354  WBC 8.4 12.0* 10.0  NEUTROABS 4.7  --   --   HGB 11.9* 11.0* 11.4*  HCT 37.0* 34.3* 35.7*  MCV 99.2 99.4 100.3*  PLT 149* 147* 034   Basic Metabolic Panel: Recent Labs  Lab 12/10/19 0503 12/11/19 0454 12/12/19 0428 12/14/19 0354  NA 140 138 140 138  K 2.9* 4.6 4.2 4.3  CL 102 98 101 101  CO2 25 29 29 26   GLUCOSE 148* 120* 103* 108*  BUN 25* 28* 35* 29*  CREATININE 1.34* 1.33* 1.31* 1.36*  CALCIUM 8.9 8.9 8.8* 8.9  MG  --  2.4  --   --    GFR: Estimated Creatinine Clearance: 52.6 mL/min (A) (by C-G formula based on SCr of 1.36 mg/dL (H)). Liver Function Tests: Recent Labs  Lab 12/10/19 0503  AST 29  ALT 18  ALKPHOS 101  BILITOT 1.1  PROT 6.7  ALBUMIN 3.8   No results for input(s): LIPASE, AMYLASE in the last 168 hours. No results for input(s): AMMONIA in the last 168 hours. Coagulation Profile: Recent Labs  Lab 12/10/19 0503 12/11/19 0454 12/12/19 0428 12/17/2019 0600 12/14/19 0354  INR 2.9* 3.4* 3.0* 2.1* 1.8*   Cardiac Enzymes: No results for input(s): CKTOTAL, CKMB, CKMBINDEX, TROPONINI in the last 168 hours. BNP (last 3 results) No results for input(s): PROBNP in the last 8760 hours. HbA1C: No results for input(s): HGBA1C in the last 72 hours. CBG: Recent Labs  Lab  12/12/19 1633 12/12/19 2100 12/31/2019 0800 12/31/2019 1146 01/09/2020 1659  GLUCAP 119* 114* 94 113* 126*   Lipid Profile: No results for input(s): CHOL, HDL, LDLCALC, TRIG, CHOLHDL, LDLDIRECT in the last 72 hours. Thyroid Function Tests: No results for input(s): TSH, T4TOTAL, FREET4, T3FREE, THYROIDAB in the last 72 hours. Anemia Panel: No results for input(s): VITAMINB12, FOLATE, FERRITIN, TIBC, IRON, RETICCTPCT in the last 72 hours. Sepsis Labs: No results for input(s): PROCALCITON, LATICACIDVEN in the last 168 hours.  Recent Results (from the past 240 hour(s))  SARS Coronavirus 2 by RT PCR (hospital order, performed in [facility] hospital lab) Nasopharyngeal Nasopharyngeal Swab     Status: None   Collection Time: 12/10/19  5:05 AM   Specimen: Nasopharyngeal Swab  Result Value Ref Range Status   SARS Coronavirus 2 NEGATIVE NEGATIVE Final    Comment: (NOTE) SARS-CoV-2 target nucleic acids are NOT DETECTED. The SARS-CoV-2 RNA is generally detectable in upper and lower respiratory specimens during the acute phase  of infection. The lowest concentration of SARS-CoV-2 viral copies this assay can detect is 250 copies / mL. A negative result does not preclude SARS-CoV-2 infection and should not be used as the sole basis for treatment or other patient management decisions.  A negative result may occur with improper specimen collection / handling, submission of specimen other than nasopharyngeal swab, presence of viral mutation(s) within the areas targeted by this assay, and inadequate number of viral copies (<250 copies / mL). A negative result must be combined with clinical observations, patient history, and epidemiological information. Fact Sheet for Patients:   StrictlyIdeas.no Fact Sheet for Healthcare Providers: BankingDealers.co.za This test is not yet approved or cleared  by the [place] FDA and has been authorized for detection  and/or diagnosis of SARS-CoV-2 by FDA under an Emergency Use Authorization (EUA).  This EUA will remain in effect (meaning this test can be used) for the duration of the COVID-19 declaration under Section 564(b)(1) of the Act, 21 U.S.C. section 360bbb-3(b)(1), unless the authorization is terminated or revoked sooner. Performed at [facility], [address], [place], [place] [address]          Radiology Studies: DG Chest 1 View  Result Date: 01/02/2020 CLINICAL DATA:  86 year old male with right pneumothorax. EXAM: CHEST  1 VIEW COMPARISON:  Radiograph dated 12/12/2019 and CT dated 12/19/2019 FINDINGS: Evaluation is very limited due to extensive soft tissue emphysema of the right chest wall. A right upper lobe pneumothorax again noted measuring approximately 15 mm in thickness to the pleural surface relatively similar to prior radiograph. Extensive right chest wall soft tissue emphysema extending to the neck. No other interval change. IMPRESSION: No significant interval change in the appearance of the right upper lobe pneumothorax compared to the prior radiograph. Continued follow-up recommended. Electronically Signed   By: Anner Crete M.D.   On: 12/17/2019 20:11   CT Chest Wo Contrast  Result Date: 12/14/2019 CLINICAL DATA:  Dislodged chest tube EXAM: CT CHEST WITHOUT CONTRAST TECHNIQUE: Multidetector CT imaging of the chest was performed following the standard protocol without IV contrast. COMPARISON:  December 13, 2019 4:26 p.m. FINDINGS: Cardiovascular: There is moderate cardiomegaly and a small pericardial effusion. Coronary artery calcifications are seen. Aortic valve and scattered aortic atherosclerosis is noted. Mediastinum/Nodes: Scattered small mediastinal lymph nodes are again noted. The thyroid gland, trachea and esophagus demonstrate no significant findings. Lungs/Pleura: Again seen is a right-sided chest tube located on the posterior superior pleura and lung apex. There  appears to be in debris within the pleural chest tube. A small anterior superior right pneumothorax is again noted, not significantly changed since the prior exam. Small bilateral pleural effusions are seen, left greater than right. Layering calcified gallstones are present. Multiple low-density lesions seen throughout the liver. There is mild misty mesentery within the mid abdomen. Musculoskeletal/Chest wall: Again noted is subcutaneous emphysema along the right lateral and anterior chest wall with small overlying hematoma. No acute fracture is noted. IMPRESSION: Right-sided chest tube in unchanged position along the posterosuperior pleural space with a small amount of internal debris. Unchanged small anterior superior right pneumothorax. Extensive unchanged subcutaneous emphysema along the anterolateral chest walls. Small bilateral pleural effusions, left greater than right. Electronically Signed   By: Prudencio Pair M.D.   On: 12/14/2019 01:44   CT CHEST WO CONTRAST  Result Date: 12/19/2019 CLINICAL DATA:  Spontaneous pneumothorax on right and status post lateral large caliber chest tube placement on 12/10/2019. CT yesterday demonstrated a persistent anterior pneumothorax  and extension of the tip of the indwelling chest tube just into the posterior mediastinum. Plan for second smaller caliber chest tube placement to treat the residual component of pneumothorax. EXAM: CT CHEST WITHOUT CONTRAST TECHNIQUE: Multidetector CT imaging of the chest was performed following the standard protocol without IV contrast. COMPARISON:  12/12/2019 FINDINGS: Cardiovascular: Stable cardiac enlargement, trace pericardial fluid and calcified coronary artery plaque. Mediastinum/Nodes: Stable scattered mediastinal lymph nodes, some of which are borderline/mildly enlarged. The largest is in the [facility] window measuring approximately 13 mm in short axis. Overall volume of pneumomediastinum has decreased slightly since the prior CT yesterday.  Lungs/Pleura: The tip of the indwelling right-sided chest tube has now been retracted slightly out of the mediastinum and is now located in the superior and medial aspect of the pleural space near the right apex. There is resultant significant decrease in the residual pneumothorax with only a small anterior pneumothorax remaining which is currently too small to accommodate a chest tube. Stable small left pleural effusion and trace right pleural effusion. Stable lateral blebs adjacent to the right upper lobe and small anterior blebs of the left lung. Musculoskeletal/Soft Tissues: Overall amount of subcutaneous emphysema in the right chest wall, right axilla and extending into the right neck shows slight enlargement since the prior CT yesterday. IMPRESSION: 1. The tip of the indwelling right-sided chest tube has now been retracted slightly out of the mediastinum and is now located in the superior and medial aspect of the pleural space near the right apex. There is resultant significant decrease in the residual right pneumothorax with only a small anterior pneumothorax remaining which is currently too small to accommodate a chest tube. 2. Overall amount of subcutaneous emphysema in the right chest wall, right axilla and extending into the right neck shows slight enlargement since the prior CT yesterday. 3. Volume of pneumomediastinum shows slight decreased since the prior CT yesterday. 4. Stable small left pleural effusion and trace right pleural effusion. 5. Stable cardiac enlargement, trace pericardial fluid and calcified coronary artery plaque. 6. Stable borderline/mildly enlarged mediastinal lymph nodes. Electronically Signed   By: Aletta Edouard M.D.   On: 01/04/2020 17:06   CT CHEST W CONTRAST  Result Date: 12/12/2019 CLINICAL DATA:  86 year old male with history of right-sided pneumothorax. EXAM: CT CHEST WITH CONTRAST TECHNIQUE: Multidetector CT imaging of the chest was performed during intravenous contrast  administration. CONTRAST:  85mL OMNIPAQUE IOHEXOL 300 MG/ML  SOLN COMPARISON:  Chest CTA 07/25/2017. FINDINGS: Cardiovascular: Heart size is enlarged with biatrial dilatation. There is no significant pericardial fluid, thickening or pericardial calcification. There is aortic atherosclerosis, as well as atherosclerosis of the great vessels of the mediastinum and the coronary arteries, including calcified atherosclerotic plaque in the left main, left anterior descending, left circumflex and right coronary arteries. Calcifications of the aortic valve and mitral annulus. Mediastinum/Nodes: Pneumomediastinum. Multiple prominent borderline enlarged mediastinal and hilar lymph nodes are incidentally noted. Esophagus is unremarkable in appearance. No axillary lymphadenopathy. Lungs/Pleura: Moderate-sized right-sided pneumothorax with right-sided chest tube in position directed into the medial aspect of the right hemithorax. However, the tip of the tube (visualized on axial image 32 of series 3 and coronal image 94 of series 5) appears to violate the pleura and extend into the middle mediastinum immediately adjacent to the trachea. In this region, there is a focal distortion of the tracheal wall best appreciated on axial image 33 of series 3 and the adjacent images, which could suggest a focal area of tracheal trauma. Trace right  and small left pleural effusions lying dependently. No acute consolidative airspace disease. No definite suspicious appearing pulmonary nodules or masses are noted. Upper Abdomen: Aortic atherosclerosis. Small calcified gallstones lying dependently in the gallbladder. Liver has a shrunken appearance and nodular contour, suggestive of underlying cirrhosis. Musculoskeletal: Large amount of subcutaneous emphysema and intramuscular gas in the right chest wall tracking cephalad into the right cervical region. There are no aggressive appearing lytic or blastic lesions noted in the visualized portions of  the skeleton. IMPRESSION: 1. Right-sided chest tube noted, with tip of chest tube potentially in the middle mediastinum adjacent to the mid trachea. Possible direct traumatic injury to the trachea noted (although these findings may simply be related to retained secretions in the trachea). Large amount of pneumomediastinum. Moderate volume of residual right pneumothorax. Extensive subcutaneous emphysema, as above. 2. Cardiomegaly with biatrial dilatation. 3. Aortic atherosclerosis, in addition to left main and 3 vessel coronary artery disease. 4. There are calcifications of the aortic valve and mitral annulus. Echocardiographic correlation for evaluation of potential valvular dysfunction may be warranted if clinically indicated. 5. Cholelithiasis. 6. Morphologic changes in the liver suggestive of cirrhosis. Aortic Atherosclerosis (ICD10-I70.0). These results will be called to the ordering clinician or representative by the Radiologist Assistant, and communication documented in the PACS or [employer]. Electronically Signed   By: Vinnie Langton M.D.   On: 12/12/2019 20:33   DG Chest Portable 1 View  Result Date: 12/11/2019 CLINICAL DATA:  86 year old male with right pneumothorax. EXAM: PORTABLE CHEST 1 VIEW COMPARISON:  Earlier radiograph dated 12/20/2019. FINDINGS: Apparent pleural reflection along the right upper lobe concerning for a small pneumothorax. No significant interval change since the prior radiograph. Evaluation is limited due to overlying soft tissue emphysema. No other interval change. IMPRESSION: No significant interval change since the prior radiograph. Electronically Signed   By: Anner Crete M.D.   On: 01/03/2020 23:22        Scheduled Meds: . diltiazem  240 mg Oral Daily  . mometasone-formoterol  2 puff Inhalation BID  . simvastatin  40 mg Oral q1800  . sodium chloride flush  3 mL Intravenous Q12H  . sodium chloride flush  3 mL Intravenous Q12H  . torsemide  100 mg Oral  Daily  . umeclidinium bromide  1 puff Inhalation Daily   Continuous Infusions: . sodium chloride       LOS: 0 days    Time spent: 35 mins,More than 50% of that time was spent in counseling and/or coordination of care.      Shelly Coss, MD [facility] P6/10/2019, 7:55 AM

## 2019-12-14 NOTE — ED Notes (Signed)
[name] Hardin [phone] daughter  [name] daughter [phone]  Please call with updates

## 2019-12-14 NOTE — H&P (Signed)
History and Physical    NICOLIS BOODY IWP:[id number] DOB: 1931-11-04 DOA: 01/02/2020  PCP: [name], MD   Patient coming from: Home   Chief Complaint: SOB, chest discomfort   HPI: PHELAN SCHADT is a 86 y.o. male with medical history significant for COPD, atrial fibrillation and history of PE on Coumadin, chronic CHF, and hypertension, who presented to the emergency department on 12/10/2019 with acute onset of shortness of breath and chest discomfort.  The patient had reportedly been in his usual state of health until he felt a "pop" and experienced acute onset dyspnea and chest discomfort.  He appeared to be in distress and EMS was called, found the patient to be saturating in the 70s on room air.  He was treated with 125 mg IV Solu-Medrol, magnesium, and duo nebs by EMS prior to his arrival in the ED.  [facility] ED Course: Upon arrival to the ED, patient is found to be in respiratory distress with hypoxia, and atrial fibrillation with rapid ventricular response, and stable blood pressure.  He was found to have a right-sided pneumothorax with concern for tension component and chest tube was placed.  He improved significantly with chest tube placement, supplemental oxygen was weaned down to 2 L/min via nasal cannula, he was given IV diltiazem, and admitted to the hospitalist service.  Repeat imaging demonstrated slight increase in the pneumothorax.  Subcutaneous emphysema continued to worsen and without cardiothoracic surgery backup at [facility], the surgeon managing the chest tube there recommended transfer to another facility.  CT surgery at [facility] was consulted and the patient was transferred here to [facility].  There was concern that the chest tube may have been displaced and that the patient might be worsening, and so he was brought to the ED at [facility] to facilitate more rapid assessment, appeared stable, had repeat CXR that appeared unchanged, and CT surgery was consulted by ED physician  and recommended repeat CT chest now and admission to progressive unit.   Review of Systems:  All other systems reviewed and apart from HPI, are negative.  Past Medical History:  Diagnosis Date  . ATRIAL FIBRILLATION 03/05/2010   Qualifier: Diagnosis of  By: Lovena Le, MD, Martyn Malay   . Benign hypertension 12/16/2013  . Cancer ([place])    bladder  . Chronic obstructive pulmonary disease ([place]) 12/16/2013  . COPD (chronic obstructive pulmonary disease) ([place])   . Family history of adverse reaction to anesthesia    Brother has a difficult time waking up.  Marland Kitchen Hx pulmonary embolism 08/2000  . Hypercholesterolemia   . Hyperglycemia   . Hypertension   . Osteoarthritis   . Pre-diabetes   . PREMATURE VENTRICULAR CONTRACTIONS 03/05/2010   Qualifier: Diagnosis of  By: Lovena Le, MD, Martyn Malay   . Pulmonary embolism ([place]) 12/16/2013  . Stroke [facility]) 1997    Past Surgical History:  Procedure Laterality Date  . CYSTOSCOPY W/ RETROGRADES Bilateral 05/18/2017   Procedure: CYSTOSCOPY WITH RETROGRADE PYELOGRAM;  Surgeon: Hollice Espy, MD;  Location: [facility] ORS;  Service: Urology;  Laterality: Bilateral;  . CYSTOSCOPY WITH BIOPSY N/A 05/18/2017   Procedure: CYSTOSCOPY WITH BIOPSY;  Surgeon: Hollice Espy, MD;  Location: [facility] ORS;  Service: Urology;  Laterality: N/A;  . JOINT REPLACEMENT Right   . KYPHOPLASTY N/A 10/25/2019   Procedure: L2 KYPHOPLASTY;  Surgeon: Hessie Knows, MD;  Location: [facility] ORS;  Service: Orthopedics;  Laterality: N/A;  . PERIPHERAL VASCULAR CATHETERIZATION N/A 02/04/2015   Procedure: IVC Filter Removal;  Surgeon: Katha Cabal, MD;  Location: [facility] LAB;  Service: Cardiovascular;  Laterality: N/A;  . TONSILLECTOMY    . URETEROSCOPY Left 05/18/2017   Procedure: URETEROSCOPY;  Surgeon: Hollice Espy, MD;  Location: [facility] ORS;  Service: Urology;  Laterality: Left;     reports that he quit smoking about 28 years ago. He has never used smokeless tobacco. He  reports that he does not drink alcohol or use drugs.  No Known Allergies  Family History  Problem Relation Age of Onset  . Prostate cancer Neg Hx   . Kidney cancer Neg Hx      Prior to Admission medications   Medication Sig Start Date End Date Taking? Authorizing Provider  allopurinol (ZYLOPRIM) 300 MG tablet Take 300 mg by mouth daily. In am    [provider]  colchicine (COLCRYS) 0.6 MG tablet Take 0.6 mg by mouth every other day.     [provider]  diltiazem (CARDIZEM CD) 240 MG 24 hr capsule Take 240 mg by mouth daily. In am    [provider]  Fluticasone-Salmeterol (ADVAIR DISKUS) 250-50 MCG/DOSE AEPB Inhale 1 puff into the lungs 2 (two) times daily.      [provider]  Multiple Vitamin (MULTIVITAMIN WITH MINERALS) TABS tablet Take 1 tablet by mouth daily.    [provider]  polyethylene glycol (MIRALAX / GLYCOLAX) 17 g packet Take 17 g by mouth daily. 12/24/2019   Fritzi Mandes, MD  potassium chloride (KLOR-CON) 10 MEQ CR tablet Take 10 mEq by mouth daily. In am    [provider]  simvastatin (ZOCOR) 40 MG tablet Take 40 mg by mouth daily at 6 PM.     [provider]  tiotropium (SPIRIVA HANDIHALER) 18 MCG inhalation capsule Place 18 mcg into inhaler and inhale daily. In am    [provider]  torsemide (DEMADEX) 100 MG tablet Take 100 mg by mouth daily. 11/26/19   [provider]    Physical Exam: Vitals:   12/15/2019 2300 12/29/2019 2315 01/01/2020 2330 12/24/2019 2345  BP: 111/64 100/62 (!) 97/58 (!) 106/54  Pulse: 76 95 67 (!) 42  Resp:  15    Temp:      TempSrc:      SpO2: 94% 94% 96% 95%    Constitutional: NAD, calm  Eyes: Right periorbital swelling, and conjunctivae normal ENMT: Mucous membranes are moist. Posterior pharynx clear of any exudate or lesions.   Neck: normal, supple, no masses, no thyromegaly Respiratory: Speaking full sentances, no wheezing. No accessory muscle use.    Cardiovascular: Rate ~80 and irregularly irregular. Bilateral leg swelling. Abdomen: No distension, no tenderness, soft. Bowel sounds active.  Musculoskeletal: no clubbing / cyanosis. No joint deformity upper and lower extremities.   Skin: no significant rashes, lesions, ulcers. Warm, dry, well-perfused. Neurologic: CN 2-12 grossly intact. Sensation intact. Moving all extremities.  Psychiatric: Alert. Oriented to person, place, and situation.    Labs and Imaging on Admission: I have personally reviewed following labs and imaging studies  CBC: Recent Labs  Lab 12/10/19 0503 12/12/19 0428  WBC 8.4 12.0*  NEUTROABS 4.7  --   HGB 11.9* 11.0*  HCT 37.0* 34.3*  MCV 99.2 99.4  PLT 149* 629*   Basic Metabolic Panel: Recent Labs  Lab 12/10/19 0503 12/11/19 0454 12/12/19 0428  NA 140 138 140  K 2.9* 4.6 4.2  CL 102 98 101  CO2 25 29 29   GLUCOSE 148* 120* 103*  BUN 25*  28* 35*  CREATININE 1.34* 1.33* 1.31*  CALCIUM 8.9 8.9 8.8*  MG  --  2.4  --    GFR: Estimated Creatinine Clearance: 54.6 mL/min (A) (by C-G formula based on SCr of 1.31 mg/dL (H)). Liver Function Tests: Recent Labs  Lab 12/10/19 0503  AST 29  ALT 18  ALKPHOS 101  BILITOT 1.1  PROT 6.7  ALBUMIN 3.8   No results for input(s): LIPASE, AMYLASE in the last 168 hours. No results for input(s): AMMONIA in the last 168 hours. Coagulation Profile: Recent Labs  Lab 12/10/19 0503 12/11/19 0454 12/12/19 0428 01/01/2020 0600  INR 2.9* 3.4* 3.0* 2.1*   Cardiac Enzymes: No results for input(s): CKTOTAL, CKMB, CKMBINDEX, TROPONINI in the last 168 hours. BNP (last 3 results) No results for input(s): PROBNP in the last 8760 hours. HbA1C: No results for input(s): HGBA1C in the last 72 hours. CBG: Recent Labs  Lab 12/12/19 1633 12/12/19 2100 12/15/2019 0800 12/12/2019 1146 01/06/2020 1659  GLUCAP 119* 114* 94 113* 126*   Lipid Profile: No results for input(s): CHOL, HDL, LDLCALC, TRIG, CHOLHDL, LDLDIRECT in  the last 72 hours. Thyroid Function Tests: No results for input(s): TSH, T4TOTAL, FREET4, T3FREE, THYROIDAB in the last 72 hours. Anemia Panel: No results for input(s): VITAMINB12, FOLATE, FERRITIN, TIBC, IRON, RETICCTPCT in the last 72 hours. Urine analysis:    Component Value Date/Time   COLORURINE Amber 11/01/2011 1313   APPEARANCEUR Cloudy (A) 08/09/2019 0938   LABSPEC 1.024 11/01/2011 1313   PHURINE 5.0 11/01/2011 1313   GLUCOSEU Trace (A) 08/09/2019 0938   GLUCOSEU Negative 11/01/2011 1313   HGBUR 3+ 11/01/2011 1313   BILIRUBINUR Negative 08/09/2019 0938   BILIRUBINUR Negative 11/01/2011 1313   KETONESUR Negative 11/01/2011 1313   PROTEINUR 2+ (A) 08/09/2019 0938   PROTEINUR 100 mg/dL 11/01/2011 1313   NITRITE Negative 08/09/2019 0938   NITRITE Negative 11/01/2011 1313   LEUKOCYTESUR Negative 08/09/2019 0938   LEUKOCYTESUR 1+ 11/01/2011 1313   Sepsis Labs: @LABRCNTIP (procalcitonin:4,lacticidven:4) ) Recent Results (from the past 240 hour(s))  SARS Coronavirus 2 by RT PCR (hospital order, performed in [facility] hospital lab) Nasopharyngeal Nasopharyngeal Swab     Status: None   Collection Time: 12/10/19  5:05 AM   Specimen: Nasopharyngeal Swab  Result Value Ref Range Status   SARS Coronavirus 2 NEGATIVE NEGATIVE Final    Comment: (NOTE) SARS-CoV-2 target nucleic acids are NOT DETECTED. The SARS-CoV-2 RNA is generally detectable in upper and lower respiratory specimens during the acute phase of infection. The lowest concentration of SARS-CoV-2 viral copies this assay can detect is 250 copies / mL. A negative result does not preclude SARS-CoV-2 infection and should not be used as the sole basis for treatment or other patient management decisions.  A negative result may occur with improper specimen collection / handling, submission of specimen other than nasopharyngeal swab, presence of viral mutation(s) within the areas targeted by this assay, and inadequate number of  viral copies (<250 copies / mL). A negative result must be combined with clinical observations, patient history, and epidemiological information. Fact Sheet for Patients:   StrictlyIdeas.no Fact Sheet for Healthcare Providers: BankingDealers.co.za This test is not yet approved or cleared  by the [place] FDA and has been authorized for detection and/or diagnosis of SARS-CoV-2 by FDA under an Emergency Use Authorization (EUA).  This EUA will remain in effect (meaning this test can be used) for the duration of the COVID-19 declaration under Section 564(b)(1) of the Act, 21 U.S.C. section 360bbb-3(b)(1),  unless the authorization is terminated or revoked sooner. Performed at [facility], [address] [place], [place] [address]      Radiological Exams on Admission: DG Chest 1 View  Result Date: 12/12/2019 CLINICAL DATA:  86 year old male with right pneumothorax. EXAM: CHEST  1 VIEW COMPARISON:  Radiograph dated 12/12/2019 and CT dated 12/15/2019 FINDINGS: Evaluation is very limited due to extensive soft tissue emphysema of the right chest wall. A right upper lobe pneumothorax again noted measuring approximately 15 mm in thickness to the pleural surface relatively similar to prior radiograph. Extensive right chest wall soft tissue emphysema extending to the neck. No other interval change. IMPRESSION: No significant interval change in the appearance of the right upper lobe pneumothorax compared to the prior radiograph. Continued follow-up recommended. Electronically Signed   By: Anner Crete M.D.   On: 01/04/2020 20:11   CT CHEST WO CONTRAST  Result Date: 12/19/2019 CLINICAL DATA:  Spontaneous pneumothorax on right and status post lateral large caliber chest tube placement on 12/10/2019. CT yesterday demonstrated a persistent anterior pneumothorax and extension of the tip of the indwelling chest tube just into the posterior  mediastinum. Plan for second smaller caliber chest tube placement to treat the residual component of pneumothorax. EXAM: CT CHEST WITHOUT CONTRAST TECHNIQUE: Multidetector CT imaging of the chest was performed following the standard protocol without IV contrast. COMPARISON:  12/12/2019 FINDINGS: Cardiovascular: Stable cardiac enlargement, trace pericardial fluid and calcified coronary artery plaque. Mediastinum/Nodes: Stable scattered mediastinal lymph nodes, some of which are borderline/mildly enlarged. The largest is in the AP window measuring approximately 13 mm in short axis. Overall volume of pneumomediastinum has decreased slightly since the prior CT yesterday. Lungs/Pleura: The tip of the indwelling right-sided chest tube has now been retracted slightly out of the mediastinum and is now located in the superior and medial aspect of the pleural space near the right apex. There is resultant significant decrease in the residual pneumothorax with only a small anterior pneumothorax remaining which is currently too small to accommodate a chest tube. Stable small left pleural effusion and trace right pleural effusion. Stable lateral blebs adjacent to the right upper lobe and small anterior blebs of the left lung. Musculoskeletal/Soft Tissues: Overall amount of subcutaneous emphysema in the right chest wall, right axilla and extending into the right neck shows slight enlargement since the prior CT yesterday. IMPRESSION: 1. The tip of the indwelling right-sided chest tube has now been retracted slightly out of the mediastinum and is now located in the superior and medial aspect of the pleural space near the right apex. There is resultant significant decrease in the residual right pneumothorax with only a small anterior pneumothorax remaining which is currently too small to accommodate a chest tube. 2. Overall amount of subcutaneous emphysema in the right chest wall, right axilla and extending into the right neck shows  slight enlargement since the prior CT yesterday. 3. Volume of pneumomediastinum shows slight decreased since the prior CT yesterday. 4. Stable small left pleural effusion and trace right pleural effusion. 5. Stable cardiac enlargement, trace pericardial fluid and calcified coronary artery plaque. 6. Stable borderline/mildly enlarged mediastinal lymph nodes. Electronically Signed   By: Aletta Edouard M.D.   On: 12/15/2019 17:06   CT CHEST W CONTRAST  Result Date: 12/12/2019 CLINICAL DATA:  86 year old male with history of right-sided pneumothorax. EXAM: CT CHEST WITH CONTRAST TECHNIQUE: Multidetector CT imaging of the chest was performed during intravenous contrast administration. CONTRAST:  9mL OMNIPAQUE IOHEXOL 300 MG/ML  SOLN COMPARISON:  Chest CTA 07/25/2017. FINDINGS: Cardiovascular: Heart size is enlarged with biatrial dilatation. There is no significant pericardial fluid, thickening or pericardial calcification. There is aortic atherosclerosis, as well as atherosclerosis of the great vessels of the mediastinum and the coronary arteries, including calcified atherosclerotic plaque in the left main, left anterior descending, left circumflex and right coronary arteries. Calcifications of the aortic valve and mitral annulus. Mediastinum/Nodes: Pneumomediastinum. Multiple prominent borderline enlarged mediastinal and hilar lymph nodes are incidentally noted. Esophagus is unremarkable in appearance. No axillary lymphadenopathy. Lungs/Pleura: Moderate-sized right-sided pneumothorax with right-sided chest tube in position directed into the medial aspect of the right hemithorax. However, the tip of the tube (visualized on axial image 32 of series 3 and coronal image 94 of series 5) appears to violate the pleura and extend into the middle mediastinum immediately adjacent to the trachea. In this region, there is a focal distortion of the tracheal wall best appreciated on axial image 33 of series 3 and the adjacent  images, which could suggest a focal area of tracheal trauma. Trace right and small left pleural effusions lying dependently. No acute consolidative airspace disease. No definite suspicious appearing pulmonary nodules or masses are noted. Upper Abdomen: Aortic atherosclerosis. Small calcified gallstones lying dependently in the gallbladder. Liver has a shrunken appearance and nodular contour, suggestive of underlying cirrhosis. Musculoskeletal: Large amount of subcutaneous emphysema and intramuscular gas in the right chest wall tracking cephalad into the right cervical region. There are no aggressive appearing lytic or blastic lesions noted in the visualized portions of the skeleton. IMPRESSION: 1. Right-sided chest tube noted, with tip of chest tube potentially in the middle mediastinum adjacent to the mid trachea. Possible direct traumatic injury to the trachea noted (although these findings may simply be related to retained secretions in the trachea). Large amount of pneumomediastinum. Moderate volume of residual right pneumothorax. Extensive subcutaneous emphysema, as above. 2. Cardiomegaly with biatrial dilatation. 3. Aortic atherosclerosis, in addition to left main and 3 vessel coronary artery disease. 4. There are calcifications of the aortic valve and mitral annulus. Echocardiographic correlation for evaluation of potential valvular dysfunction may be warranted if clinically indicated. 5. Cholelithiasis. 6. Morphologic changes in the liver suggestive of cirrhosis. Aortic Atherosclerosis (ICD10-I70.0). These results will be called to the ordering clinician or representative by the Radiologist Assistant, and communication documented in the PACS or [employer]. Electronically Signed   By: Vinnie Langton M.D.   On: 12/12/2019 20:33   DG Chest Portable 1 View  Result Date: 12/27/2019 CLINICAL DATA:  86 year old male with right pneumothorax. EXAM: PORTABLE CHEST 1 VIEW COMPARISON:  Earlier radiograph  dated 01/05/2020. FINDINGS: Apparent pleural reflection along the right upper lobe concerning for a small pneumothorax. No significant interval change since the prior radiograph. Evaluation is limited due to overlying soft tissue emphysema. No other interval change. IMPRESSION: No significant interval change since the prior radiograph. Electronically Signed   By: Anner Crete M.D.   On: 12/18/2019 23:22   DG CHEST PORT 1 VIEW  Result Date: 12/12/2019 CLINICAL DATA:  86 year old male with pneumothorax EXAM: PORTABLE CHEST 1 VIEW COMPARISON:  Prior chest x-ray 12/11/2019 FINDINGS: Stable cardiac and mediastinal contours. Atherosclerotic calcifications again noted in the transverse aorta. Perhaps slight interval progression of trace right apical pneumothorax. The pneumothorax is approximately 5%. Slightly progressive diffuse subcutaneous emphysema within the soft tissues of the right neck and chest wall. Chronic background bronchitic changes and mild bibasilar atelectasis. IMPRESSION: 1. Slight increase in small right apical pneumothorax  which remains less than 10%). 2. Increasing subcutaneous emphysema. Electronically Signed   By: Jacqulynn Cadet M.D.   On: 12/12/2019 08:45    EKG: Independently reviewed. Atrial fibrillation, rate 127, RBBB, LAFB.   Assessment/Plan   1. Pneumothorax; acute hypoxic respiratory failure  - Presented to [facility] on 5/31 with acute onset SOB and chest discomfort, was found to have right-sided pneumothorax with concern for tension component and chest tube was placed  - With soft-tissue emphysema worsening and lack of CT surgery back up at [facility], CT surgery at [facility] was consulted and patient transferred  - There was concern en route to [facility] that chest tube may have been dislodged and he was brought to ED for eval rather than upstairs  - CT surgery consulted by ED physician and much appreciated, recommending repeat chest CT now  - Repeat chest CT now, continue chest tube to  suction, pain-control, supportive care   2. Acute on chronic systolic CHF  - Was diuresed with IV Lasix at [facility], appears compensated  - Resume home-dose torsemide, continue daily weights and I/Os    3. Atrial fibrillation  - Required IV diltiazem for rate-control at [facility], now maintaining normal rate back on oral diltiazem  - CHADS-VASc is at least 60 (age x2, CAD, CHF)  - INR was 2.1 this am, continue to hold warfarin pending CT surgery evaluation and start IV heparin once <2  - Continue diltiazem   4. COPD  - No cough or wheeze on admission  - Continue ICS/LABA, Spirva    5. CKD IIIa  - SCr is 1.31, similar to priors  - Renally-dose medications, monitor   6. CAD  - No anginal complaints, continue statin and ASA    DVT prophylaxis: warfarin pta, held currently   Code Status: DNR  Family Communication: Family updated at bedside Disposition Plan:  Patient is from: Home  Anticipated d/c is to: TBD Anticipated d/c date is: 12/17/19 Patient currently: Pending CT surgery evaluation for management of right PTX Consults called: CT surgery  Admission status: Inpatient     Vianne Bulls, MD [facility] Pager: See [email or web address]  If 7AM-7PM, please contact the daytime attending [email or web address]  12/14/2019, 12:25 AM

## 2019-12-14 NOTE — Progress Notes (Signed)
ANTICOAGULATION CONSULT NOTE - Initial Consult  Pharmacy Consult for heparin Indication: atrial fibrillation  No Known Allergies  Patient Measurements:   Heparin Dosing Weight: 102kg  Vital Signs: Temp: 98 F (36.7 C) (06/04 1611) BP: 102/63 (06/04 1611) Pulse Rate: 74 (06/04 1611)  Labs: Recent Labs    12/12/19 0428 01/04/2020 0600 12/14/19 0354 12/14/19 1625  HGB 11.0*  --  11.4*  --   HCT 34.3*  --  35.7*  --   PLT 147*  --  161  --   LABPROT 30.0* 23.2* 20.1*  --   INR 3.0* 2.1* 1.8*  --   HEPARINUNFRC  --   --   --  0.30  CREATININE 1.31*  --  1.36*  --     Estimated Creatinine Clearance: 52.6 mL/min (A) (by C-G formula based on SCr of 1.36 mg/dL (H)).   Medical History: Past Medical History:  Diagnosis Date  . ATRIAL FIBRILLATION 03/05/2010   Qualifier: Diagnosis of  By: Lovena Le, MD, Martyn Malay   . Benign hypertension 12/16/2013  . Cancer ([place])    bladder  . Chronic obstructive pulmonary disease ([place]) 12/16/2013  . COPD (chronic obstructive pulmonary disease) ([place])   . Family history of adverse reaction to anesthesia    Brother has a difficult time waking up.  Marland Kitchen Hx pulmonary embolism 08/2000  . Hypercholesterolemia   . Hyperglycemia   . Hypertension   . Osteoarthritis   . Pre-diabetes   . PREMATURE VENTRICULAR CONTRACTIONS 03/05/2010   Qualifier: Diagnosis of  By: Lovena Le, MD, Martyn Malay   . Pulmonary embolism ([place]) 12/16/2013  . Stroke [facility]) 1997    Assessment: [name] transfer from [name] with pneumothorax/chest tube, on warfarin PTA for Afib/hx PE, warfarin held for possible procedure.  INR today slightly subtherapeutic at 1.8 and pharmacy consulted to initiate heparin.  H/H low but stable, plts 161.    First HL drawn 6.5hr after start is 0.3 at goal.   Goal of Therapy:  Heparin level 0.3-0.7 units/ml Monitor platelets by anticoagulation protocol: Yes   Plan:  Heparin gtt at 1450 units/hr, no bolus Monitor daily HL, CBC/plt Monitor  for signs/symptoms of bleeding    Benetta Spar, PharmD, BCPS, BCCP Clinical Pharmacist  Please check AMION for all [place] phone numbers After 10:00 PM, call [place]

## 2019-12-14 NOTE — Progress Notes (Signed)
[facility] notified that crackes were heard in the right Lung which appears to be new onset O2 stats 94% 3L

## 2019-12-14 NOTE — Consult Note (Addendum)
[name] [address]             [phone]        Majd G Harr Ko Olina Medical Record #[id number] Date of Birth: [birth year]  Referring: Vianne Bulls, MD Primary Care: [name], MD Primary Cardiologist:No primary care provider on file.  Chief Complaint: Right spontaneous pneumothorax   History of Present Illness:     [name] Hardin is a very pleasant 86 year old gentleman with a past history of hypertension, COPD, cigarette smoking, type 2 diabetes mellitus, stage III chronic kidney disease, paroxysmal atrial fibrillation, acute on chronic systolic heart failure, and history of pulmonary embolus currently on Coumadin.  He presented to the emergency room at [name] on 12/10/2019 after he developed sudden onset shortness of breath and chest discomfort while at home in his bathroom.  In the ED, he was found to be in atrial fibrillation with RVR with a rate of around 130.  His oxygen saturation was around 80%.  Chest imaging demonstrated a large right-sided pneumothorax with possible tension component.  A chest tube was placed and follow-up chest x-ray showed the right apical chest tube to terminate at the medial right apical pleural space.  The pneumothorax had resolved.  There was radiographic evidence of COPD.  Patient was admitted to the hospital for further observation and management.  By the second hospital day, he had developed significant right-sided subcutaneous emphysema and chest x-ray on the morning of 12/12/2019 demonstrated a 5% pneumothorax.  A CT scan of the chest was obtained that showed the right side chest tube potentially in the middle mediastinum adjacent to the mid trachea.  Our service was contacted and transfer for to [name] for further management was requested.  The patient was accepted in transfer by Dr. Pia Mau but transportation could not be arranged for several hours.  In the interim,  the patient was returned to the radiology suite at [name] by an interventional radiologist for follow-up chest imaging and possible placement of a second chest tube.  By this time, the patient had developed extensive right anterior and lateral chest wall subcu emphysema that extended up into his neck and face with significant swelling of his right eyelid.  The chest tube was adjusted resulting in near complete resolution of the pneumothorax so a second chest tube was not placed.  [name] Hardin was transported to [name], ED at around midnight on 12/11/2019.  The chest CT was repeated after his arrival and showed the right chest tube had not changed in position along the posterior superior pleural space.  There was a small anterior right pneumothorax that was unchanged.  Patient has remained stable since admission to the hospital.  He was initially seen this morning at about 7:15 AM and again this afternoon at 2:15 PM.  He is breathing comfortably on 3 L of oxygen by nasal cannula and his oxygen saturation is 97%.  He denies any pain or shortness of breath at this time. The cardiothoracic surgery service has been asked to see [name] Hardin for management of his spontaneous pneumothorax.   Current Activity/ Functional Status:  Zubrod Score: At the time of surgery this patient's most appropriate activity status/level should be described as: []     0    Normal activity, no symptoms [x]     1    Restricted in physical strenuous activity but ambulatory, able to do  out light work []     2    Ambulatory and capable of self care, unable to do work activities, up and about                 more than 50%  Of the time                            []     3    Only limited self care, in bed greater than 50% of waking hours []     4    Completely disabled, no self care, confined to bed or chair []     5    Moribund  Past Medical History:  Diagnosis Date  . ATRIAL FIBRILLATION 03/05/2010   Qualifier:  Diagnosis of  By: Lovena Le, MD, Martyn Malay   . Benign hypertension 12/16/2013  . Cancer ([place])    bladder  . Chronic obstructive pulmonary disease ([place]) 12/16/2013  . COPD (chronic obstructive pulmonary disease) ([place])   . Family history of adverse reaction to anesthesia    Brother has a difficult time waking up.  Marland Kitchen Hx pulmonary embolism 08/2000  . Hypercholesterolemia   . Hyperglycemia   . Hypertension   . Osteoarthritis   . Pre-diabetes   . PREMATURE VENTRICULAR CONTRACTIONS 03/05/2010   Qualifier: Diagnosis of  By: Lovena Le, MD, Martyn Malay   . Pulmonary embolism ([place]) 12/16/2013  . Stroke [facility]) 1997    Past Surgical History:  Procedure Laterality Date  . CYSTOSCOPY W/ RETROGRADES Bilateral 05/18/2017   Procedure: CYSTOSCOPY WITH RETROGRADE PYELOGRAM;  Surgeon: Hollice Espy, MD;  Location: [facility] ORS;  Service: Urology;  Laterality: Bilateral;  . CYSTOSCOPY WITH BIOPSY N/A 05/18/2017   Procedure: CYSTOSCOPY WITH BIOPSY;  Surgeon: Hollice Espy, MD;  Location: [facility] ORS;  Service: Urology;  Laterality: N/A;  . JOINT REPLACEMENT Right   . KYPHOPLASTY N/A 10/25/2019   Procedure: L2 KYPHOPLASTY;  Surgeon: Hessie Knows, MD;  Location: [facility] ORS;  Service: Orthopedics;  Laterality: N/A;  . PERIPHERAL VASCULAR CATHETERIZATION N/A 02/04/2015   Procedure: IVC Filter Removal;  Surgeon: Katha Cabal, MD;  Location: [place] CV LAB;  Service: Cardiovascular;  Laterality: N/A;  . TONSILLECTOMY    . URETEROSCOPY Left 05/18/2017   Procedure: URETEROSCOPY;  Surgeon: Hollice Espy, MD;  Location: [facility] ORS;  Service: Urology;  Laterality: Left;    Social History   Tobacco Use  Smoking Status Former Smoker  . Quit date: 05/06/1991  . Years since quitting: 28.6  Smokeless Tobacco Never Used    Social History   Substance and Sexual Activity  Alcohol Use No  . Alcohol/week: 0.0 standard drinks     No Known Allergies  Current Facility-Administered Medications    Medication Dose Route Frequency Provider Last Rate Last Admin  . 0.9 %  sodium chloride infusion  250 mL Intravenous PRN Opyd, Ilene Qua, MD      . acetaminophen (TYLENOL) tablet 650 mg  650 mg Oral Q6H PRN Opyd, Ilene Qua, MD   650 mg at 12/14/19 1013   Or  . acetaminophen (TYLENOL) suppository 650 mg  650 mg Rectal Q6H PRN Opyd, Ilene Qua, MD      . diltiazem (CARDIZEM CD) 24 hr capsule 240 mg  240 mg Oral Daily Opyd, Ilene Qua, MD   240 mg at 12/14/19 4656  . heparin ADULT infusion 100 units/mL (25000 units/262mL sodium chloride 0.45%)  1,450 Units/hr Intravenous Continuous Bertis Ruddy, [facility]  14.5 mL/hr at 12/14/19 1004 1,450 Units/hr at 12/14/19 1004  . HYDROcodone-acetaminophen (NORCO/VICODIN) 5-325 MG per tablet 1 tablet  1 tablet Oral Q4H PRN Opyd, Ilene Qua, MD      . mometasone-formoterol (DULERA) 200-5 MCG/ACT inhaler 2 puff  2 puff Inhalation BID Opyd, Ilene Qua, MD   2 puff at 12/14/19 0739  . morphine 4 MG/ML injection 3 mg  3 mg Intravenous Q4H PRN Opyd, Ilene Qua, MD      . ondansetron (ZOFRAN) tablet 4 mg  4 mg Oral Q6H PRN Opyd, Ilene Qua, MD       Or  . ondansetron (ZOFRAN) injection 4 mg  4 mg Intravenous Q6H PRN Opyd, Ilene Qua, MD      . polyethylene glycol (MIRALAX / GLYCOLAX) packet 17 g  17 g Oral Daily PRN Opyd, Ilene Qua, MD      . simvastatin (ZOCOR) tablet 40 mg  40 mg Oral q1800 Opyd, Ilene Qua, MD      . sodium chloride flush (NS) 0.9 % injection 3 mL  3 mL Intravenous Q12H Opyd, Ilene Qua, MD   3 mL at 12/14/19 0814  . sodium chloride flush (NS) 0.9 % injection 3 mL  3 mL Intravenous Q12H Opyd, Timothy S, MD      . sodium chloride flush (NS) 0.9 % injection 3 mL  3 mL Intravenous PRN Opyd, Ilene Qua, MD      . torsemide (DEMADEX) tablet 100 mg  100 mg Oral Daily Opyd, Ilene Qua, MD   100 mg at 12/14/19 0813  . umeclidinium bromide (INCRUSE ELLIPTA) 62.5 MCG/INH 1 puff  1 puff Inhalation Daily Opyd, Ilene Qua, MD   1 puff at 12/14/19 7616    Medications Prior  to Admission  Medication Sig Dispense Refill Last Dose  . acetaminophen (TYLENOL) 500 MG tablet Take 1,000 mg by mouth daily as needed.     Marland Kitchen allopurinol (ZYLOPRIM) 300 MG tablet Take 300 mg by mouth daily. In am     . aspirin EC 81 MG tablet Take 81 mg by mouth daily.     . colchicine (COLCRYS) 0.6 MG tablet Take 0.6 mg by mouth every other day.      . diltiazem (CARDIZEM CD) 240 MG 24 hr capsule Take 240 mg by mouth daily. In am     . Fluticasone-Salmeterol (ADVAIR DISKUS) 250-50 MCG/DOSE AEPB Inhale 1 puff into the lungs 2 (two) times daily.       . Multiple Vitamin (MULTIVITAMIN WITH MINERALS) TABS tablet Take 1 tablet by mouth daily.     . polyethylene glycol (MIRALAX / GLYCOLAX) 17 g packet Take 17 g by mouth daily. 14 each 0   . potassium chloride (KLOR-CON) 10 MEQ CR tablet Take 10 mEq by mouth daily. In am     . simvastatin (ZOCOR) 40 MG tablet Take 40 mg by mouth daily at 6 PM.      . tiotropium (SPIRIVA HANDIHALER) 18 MCG inhalation capsule Place 18 mcg into inhaler and inhale daily. In am     . torsemide (DEMADEX) 100 MG tablet Take 100 mg by mouth daily.     Marland Kitchen warfarin (COUMADIN) 5 MG tablet Take 5 mg by mouth daily.       Family History  Problem Relation Age of Onset  . Prostate cancer Neg Hx   . Kidney cancer Neg Hx      Review of Systems:      Cardiac Review of Systems: Y or  [    ]=  no  Chest Pain [    ]  Resting SOB [   ] Exertional SOB  [  ]  Orthopnea [  ]   Pedal Edema [   ]    Palpitations [  ] Syncope  [  ]   Presyncope [   ]  General Review of Systems: [Y] = yes [  ]=no Constitional: recent weight change [  ]; anorexia [  ]; fatigue [  ]; nausea [  ]; night sweats [  ]; fever [  ]; or chills [  ]                                                               Dental: Last Dentist visit:   Eye : blurred vision [  ]; diplopia [   ]; vision changes [  ];  Amaurosis fugax[  ]; Resp: cough [  ];  wheezing[  ];  hemoptysis[  ]; shortness of breath[y  ]; paroxysmal  nocturnal dyspnea[  ]; dyspnea on exertion[  ]; or orthopnea[  ];  GI:  gallstones[  ], vomiting[  ];  dysphagia[  ]; melena[  ];  hematochezia [  ]; heartburn[  ];   Hx of  Colonoscopy[  ]; GU: kidney stones [  ]; hematuria[  ];   dysuria [  ];  nocturia[  ];  history of     obstruction [  ]; urinary frequency [  ]             Skin: rash, swelling[  ];, hair loss[  ];  peripheral edema[  ];  or itching[  ]; Musculosketetal: myalgias[  ];  joint swelling[  ];  joint erythema[  ];  joint pain[  ];  back pain[  ];  Heme/Lymph: bruising[  ];  bleeding[  ];  anemia[  ];  Neuro: TIA[  ];  headaches[  ];  stroke[  ];  vertigo[  ];  seizures[  ];   paresthesias[  ];  difficulty walking[  ];  Psych:depression[  ]; anxiety[  ];  Endocrine: diabetes[y  ];  thyroid dysfunction[  ];                 Physical Exam: BP 115/62 (BP Location: Left Arm)   Pulse 73   Temp 97.9 F (36.6 C)   Resp 19   SpO2 97%    General appearance: alert, cooperative and no distress.  2 daughters were present at his bedside at the time of this interview and exam.   Head: Normocephalic, without obvious abnormality, atraumatic Neck: There is swelling with subcu air on the right side of the neck.  There is no obvious carotid bruit on either side. Resp: Breath sounds are full and equal.  He has good excursion bilaterally.  There is thin serosanguineous fluid in the pleural tube which was emptied out.  I did not see any air leak.  The chest tube is well secured high on his right lateral chest.  There is significant superficial bruising posterior to the chest tube insertion site.  There is no palpable hematoma. Cardio: The heart is in an irregularly irregular rhythm.  Heart rate is 90-100. GI: The abdomen is soft, nontender, there are active bowel sounds present throughout. Extremities:  All are warm and well-perfused.  Radial pulses are difficult to palpate but there is brisk capillary refill in both hands.  He has skin changes  in his lower extremities consistent with chronic venous stasis Neurologic: He is alert and oriented x4 and he is calm and cooperative.  He has no gross motor or sensory deficits.  Diagnostic Studies & Laboratory data:     Recent Radiology Findings:   DG Chest 1 View  Result Date: 12/22/2019 CLINICAL DATA:  86 year old male with right pneumothorax. EXAM: CHEST  1 VIEW COMPARISON:  Radiograph dated 12/12/2019 and CT dated 12/11/2019 FINDINGS: Evaluation is very limited due to extensive soft tissue emphysema of the right chest wall. A right upper lobe pneumothorax again noted measuring approximately 15 mm in thickness to the pleural surface relatively similar to prior radiograph. Extensive right chest wall soft tissue emphysema extending to the neck. No other interval change. IMPRESSION: No significant interval change in the appearance of the right upper lobe pneumothorax compared to the prior radiograph. Continued follow-up recommended. Electronically Signed   By: Anner Crete M.D.   On: 12/29/2019 20:11   CT Chest Wo Contrast  Result Date: 12/14/2019 CLINICAL DATA:  Dislodged chest tube EXAM: CT CHEST WITHOUT CONTRAST TECHNIQUE: Multidetector CT imaging of the chest was performed following the standard protocol without IV contrast. COMPARISON:  December 13, 2019 4:26 p.m. FINDINGS: Cardiovascular: There is moderate cardiomegaly and a small pericardial effusion. Coronary artery calcifications are seen. Aortic valve and scattered aortic atherosclerosis is noted. Mediastinum/Nodes: Scattered small mediastinal lymph nodes are again noted. The thyroid gland, trachea and esophagus demonstrate no significant findings. Lungs/Pleura: Again seen is a right-sided chest tube located on the posterior superior pleura and lung apex. There appears to be in debris within the pleural chest tube. A small anterior superior right pneumothorax is again noted, not significantly changed since the prior exam. Small bilateral pleural  effusions are seen, left greater than right. Layering calcified gallstones are present. Multiple low-density lesions seen throughout the liver. There is mild misty mesentery within the mid abdomen. Musculoskeletal/Chest wall: Again noted is subcutaneous emphysema along the right lateral and anterior chest wall with small overlying hematoma. No acute fracture is noted. IMPRESSION: Right-sided chest tube in unchanged position along the posterosuperior pleural space with a small amount of internal debris. Unchanged small anterior superior right pneumothorax. Extensive unchanged subcutaneous emphysema along the anterolateral chest walls. Small bilateral pleural effusions, left greater than right. Electronically Signed   By: Prudencio Pair M.D.   On: 12/14/2019 01:44   CT CHEST WO CONTRAST  Result Date: 12/20/2019 CLINICAL DATA:  Spontaneous pneumothorax on right and status post lateral large caliber chest tube placement on 12/10/2019. CT yesterday demonstrated a persistent anterior pneumothorax and extension of the tip of the indwelling chest tube just into the posterior mediastinum. Plan for second smaller caliber chest tube placement to treat the residual component of pneumothorax. EXAM: CT CHEST WITHOUT CONTRAST TECHNIQUE: Multidetector CT imaging of the chest was performed following the standard protocol without IV contrast. COMPARISON:  12/12/2019 FINDINGS: Cardiovascular: Stable cardiac enlargement, trace pericardial fluid and calcified coronary artery plaque. Mediastinum/Nodes: Stable scattered mediastinal lymph nodes, some of which are borderline/mildly enlarged. The largest is in the AP window measuring approximately 13 mm in short axis. Overall volume of pneumomediastinum has decreased slightly since the prior CT yesterday. Lungs/Pleura: The tip of the indwelling right-sided chest tube has now been retracted slightly out of the mediastinum and is now located in the superior  and medial aspect of the pleural  space near the right apex. There is resultant significant decrease in the residual pneumothorax with only a small anterior pneumothorax remaining which is currently too small to accommodate a chest tube. Stable small left pleural effusion and trace right pleural effusion. Stable lateral blebs adjacent to the right upper lobe and small anterior blebs of the left lung. Musculoskeletal/Soft Tissues: Overall amount of subcutaneous emphysema in the right chest wall, right axilla and extending into the right neck shows slight enlargement since the prior CT yesterday. IMPRESSION: 1. The tip of the indwelling right-sided chest tube has now been retracted slightly out of the mediastinum and is now located in the superior and medial aspect of the pleural space near the right apex. There is resultant significant decrease in the residual right pneumothorax with only a small anterior pneumothorax remaining which is currently too small to accommodate a chest tube. 2. Overall amount of subcutaneous emphysema in the right chest wall, right axilla and extending into the right neck shows slight enlargement since the prior CT yesterday. 3. Volume of pneumomediastinum shows slight decreased since the prior CT yesterday. 4. Stable small left pleural effusion and trace right pleural effusion. 5. Stable cardiac enlargement, trace pericardial fluid and calcified coronary artery plaque. 6. Stable borderline/mildly enlarged mediastinal lymph nodes. Electronically Signed   By: Aletta Edouard M.D.   On: 12/26/2019 17:06   CT CHEST W CONTRAST  Result Date: 12/12/2019 CLINICAL DATA:  86 year old male with history of right-sided pneumothorax. EXAM: CT CHEST WITH CONTRAST TECHNIQUE: Multidetector CT imaging of the chest was performed during intravenous contrast administration. CONTRAST:  76mL OMNIPAQUE IOHEXOL 300 MG/ML  SOLN COMPARISON:  Chest CTA 07/25/2017. FINDINGS: Cardiovascular: Heart size is enlarged with biatrial dilatation. There is  no significant pericardial fluid, thickening or pericardial calcification. There is aortic atherosclerosis, as well as atherosclerosis of the great vessels of the mediastinum and the coronary arteries, including calcified atherosclerotic plaque in the left main, left anterior descending, left circumflex and right coronary arteries. Calcifications of the aortic valve and mitral annulus. Mediastinum/Nodes: Pneumomediastinum. Multiple prominent borderline enlarged mediastinal and hilar lymph nodes are incidentally noted. Esophagus is unremarkable in appearance. No axillary lymphadenopathy. Lungs/Pleura: Moderate-sized right-sided pneumothorax with right-sided chest tube in position directed into the medial aspect of the right hemithorax. However, the tip of the tube (visualized on axial image 32 of series 3 and coronal image 94 of series 5) appears to violate the pleura and extend into the middle mediastinum immediately adjacent to the trachea. In this region, there is a focal distortion of the tracheal wall best appreciated on axial image 33 of series 3 and the adjacent images, which could suggest a focal area of tracheal trauma. Trace right and small left pleural effusions lying dependently. No acute consolidative airspace disease. No definite suspicious appearing pulmonary nodules or masses are noted. Upper Abdomen: Aortic atherosclerosis. Small calcified gallstones lying dependently in the gallbladder. Liver has a shrunken appearance and nodular contour, suggestive of underlying cirrhosis. Musculoskeletal: Large amount of subcutaneous emphysema and intramuscular gas in the right chest wall tracking cephalad into the right cervical region. There are no aggressive appearing lytic or blastic lesions noted in the visualized portions of the skeleton. IMPRESSION: 1. Right-sided chest tube noted, with tip of chest tube potentially in the middle mediastinum adjacent to the mid trachea. Possible direct traumatic injury to  the trachea noted (although these findings may simply be related to retained secretions in the trachea). Large amount of  pneumomediastinum. Moderate volume of residual right pneumothorax. Extensive subcutaneous emphysema, as above. 2. Cardiomegaly with biatrial dilatation. 3. Aortic atherosclerosis, in addition to left main and 3 vessel coronary artery disease. 4. There are calcifications of the aortic valve and mitral annulus. Echocardiographic correlation for evaluation of potential valvular dysfunction may be warranted if clinically indicated. 5. Cholelithiasis. 6. Morphologic changes in the liver suggestive of cirrhosis. Aortic Atherosclerosis (ICD10-I70.0). These results will be called to the ordering clinician or representative by the Radiologist Assistant, and communication documented in the PACS or [employer]. Electronically Signed   By: Vinnie Langton M.D.   On: 12/12/2019 20:33   DG Chest Portable 1 View  Result Date: 12/23/2019 CLINICAL DATA:  86 year old male with right pneumothorax. EXAM: PORTABLE CHEST 1 VIEW COMPARISON:  Earlier radiograph dated 12/25/2019. FINDINGS: Apparent pleural reflection along the right upper lobe concerning for a small pneumothorax. No significant interval change since the prior radiograph. Evaluation is limited due to overlying soft tissue emphysema. No other interval change. IMPRESSION: No significant interval change since the prior radiograph. Electronically Signed   By: Anner Crete M.D.   On: 12/29/2019 23:22     I have independently reviewed the above radiologic studies and discussed with the patient   Recent Lab Findings: Lab Results  Component Value Date   WBC 10.0 12/14/2019   HGB 11.4 (L) 12/14/2019   HCT 35.7 (L) 12/14/2019   PLT 161 12/14/2019   GLUCOSE 108 (H) 12/14/2019   ALT 18 12/10/2019   AST 29 12/10/2019   NA 138 12/14/2019   K 4.3 12/14/2019   CL 101 12/14/2019   CREATININE 1.36 (H) 12/14/2019   BUN 29 (H) 12/14/2019   CO2  26 12/14/2019   INR 1.8 (H) 12/14/2019   HGBA1C 6.0 (H) 12/10/2019      Assessment / Plan:      Very pleasant 86 year old gentleman with the above described chronic medical problems presented to [facility] on 12[facility]10[facility]2019 with acute chest pain and shortness of breath found to have a right spontaneous pneumothorax.  This has been managed with placement of a chest tube that initially resulted in resolution.  He was subsequently admitted to [facility].  He later developed recurrent right pneumothorax with associated right lateral anterior chest wall subcutaneous air that extends up into his right neck face and eyelid.  After adjustment of the chest tube by interventional radiologist at [facility], the pneumothorax resolved.  Patient was then transferred to [facility] late last night for further evaluation and management.  His respiratory status is currently stable.  He continues to have significant subcu air.  He does not have an air leak that can be demonstrated at the Pleur-evac.  Tube now appears to be positioned satisfactorily on the most recent chest CT.  I explained to the patient and his daughters that at this point there is no indication for surgical intervention.  Our preference would be to continue to observe and manage conservatively.  If the chest x-ray remains stable and there is no further evidence of air leak for another 24 hours, we will consider placing the chest tube to waterseal.  Medical management of his chronic illnesses is being managed by the inpatient hospitalist team.  Dr Servando Snare  is also seen [name] Hardin and reviewed his radiographs..  Our service will continue to follow with you.   Antony Odea, PA-C [phone] 12/14/2019 3:07 PM   Transfer to [facility] took 18 hours to complete ,  called at 700 am yesterday , patient brought to [facility] ER at midnight . Currently no air leak. - chest tube from [place]  Not  adequately  secured and likely kinked with patient cough resulting in sub air . Patient seen and he related his story of what happened to him since admission to [facility] .  I have seen and examined [name] Hardin and agree with the above assessment  and plan.  Grace Isaac MD Beeper [phone] Office [phone] 12/14/2019 4:13 PM

## 2019-12-15 ENCOUNTER — Other Ambulatory Visit: Payer: Self-pay

## 2019-12-15 ENCOUNTER — Inpatient Hospital Stay (HOSPITAL_COMMUNITY): Payer: Medicare Other

## 2019-12-15 LAB — PROTIME-INR
INR: 1.7 — ABNORMAL HIGH (ref 0.8–1.2)
Prothrombin Time: 19 seconds — ABNORMAL HIGH (ref 11.4–15.2)

## 2019-12-15 LAB — BASIC METABOLIC PANEL
Anion gap: 11 (ref 5–15)
BUN: 33 mg/dL — ABNORMAL HIGH (ref 8–23)
CO2: 27 mmol/L (ref 22–32)
Calcium: 8.5 mg/dL — ABNORMAL LOW (ref 8.9–10.3)
Chloride: 99 mmol/L (ref 98–111)
Creatinine, Ser: 1.28 mg/dL — ABNORMAL HIGH (ref 0.61–1.24)
GFR calc Af Amer: 59 mL/min — ABNORMAL LOW (ref >60)
GFR calc non Af Amer: 51 mL/min — ABNORMAL LOW (ref >60)
Glucose, Bld: 109 mg/dL — ABNORMAL HIGH (ref 70–99)
Potassium: 3.8 mmol/L (ref 3.5–5.1)
Sodium: 137 mmol/L (ref 135–145)

## 2019-12-15 LAB — CBC
HCT: 34.9 % — ABNORMAL LOW (ref 39.0–52.0)
Hemoglobin: 11.2 g/dL — ABNORMAL LOW (ref 13.0–17.0)
MCH: 32.1 pg (ref 26.0–34.0)
MCHC: 32.1 g/dL (ref 30.0–36.0)
MCV: 100 fL (ref 80.0–100.0)
Platelets: 165 10*3/uL (ref 150–400)
RBC: 3.49 MIL/uL — ABNORMAL LOW (ref 4.22–5.81)
RDW: 15.6 % — ABNORMAL HIGH (ref 11.5–15.5)
WBC: 10 10*3/uL (ref 4.0–10.5)
nRBC: 0 % (ref 0.0–0.2)

## 2019-12-15 LAB — HEPARIN LEVEL (UNFRACTIONATED): Heparin Unfractionated: 0.33 IU/mL (ref 0.30–0.70)

## 2019-12-15 MED ORDER — ENSURE ENLIVE PO LIQD: 237.0000 mL | Freq: Two times a day (BID) | ORAL | Status: DC

## 2019-12-15 MED ORDER — MUSCLE RUB 10-15 % EX CREA
TOPICAL_CREAM | CUTANEOUS | Status: DC | PRN
  Administered 2019-12-15: 1 via TOPICAL
  Filled 2019-12-15: qty 85

## 2019-12-15 NOTE — Progress Notes (Signed)
PROGRESS NOTE    [name]  XIP:[id number] DOB: [birth year] DOA: 12/25/2019 PCP: [name], MD   Brief Narrative: Patient is 86 year old male with history of COPD, proximal A. fib, PE on Coumadin, chronic systolic congestive heart failure, hypertension who presented to the emergency department at [facility] on 12/10/2019 with acute onset of shortness of breath and chest discomfort. On presentation he was saturating in the 80s on room air. He was initially treated for COPD exacerbation. He was also in A. fib with RVR. Imaging showed right-sided pneumothorax with concern for tension component and chest tube was placed. Respiratory status improved significantly after chest tube placement, oxygen weaned down to 2 L/min. Repeat imaging demonstrated slight increase in pneumothorax, subcutaneous emphysema so he was transferred to [name] for cardiothoracic surgery evaluation.  Currently on conservative management, chest tube is to suction.  Assessment & Plan:   Principal Problem:   Pneumothorax, right Active Problems:   Atrial fibrillation with RVR (HCC)   COPD (chronic obstructive pulmonary disease) (HCC)   Chronic kidney disease (CKD), stage III (moderate)   Diabetes mellitus, type 2 (HCC)   Acute on chronic systolic CHF (congestive heart failure) (HCC)   Acute respiratory failure with hypoxia (HCC)   Pressure injury of skin   Acute hypoxic respiratory failure: Secondary to pneumothorax.  He developed hypoxia last night and his oxygen requirement increased to 6 L/min this morning. He denies any shortness of breath , he is not in any kind of respiratory distress.  Spontaneous pneumothorax: Presented with shortness of breath and chest discomfort. Imaging showed right-sided pneumothorax with concern for tension component. [facility]. He has soft tissue emphysema. There was concern for chest tube displacement so he was sent to [name] for  cardiothoracic surgery evaluation. Cardiothoracic surgery following. Continue chest tube to suction, pain control, supportive care.  Plan is to continue conservative management.  Plan is to repeat chest x-ray tomorrow.  A. fib with RVR: Required IV Cardizem for rate control, now maintaining rate on oral Cardizem. CHADS-VASc is at least 3 (age x2, CAD, CHF) . On warfarin for anticoagulation at home. Continue IV heparin here.  Acute on chronic systolic congestive heart failure: Currently compensated. Was treated with IV Lasix. Home torsemide resumed. Continue to monitor daily weight, ins/outs  COPD: There was concern for COPD exacerbation on presentation. Currently stable. No wheezing continue home inhalers.  CKD stage IIIa: Currently kidney function at baseline.  Coronary artery disease: No anginal complaints. Continue statin and ASA          DVT prophylaxis: IV heparin Code Status: DNR Family Communication: Daughter present at the bedside Status is: Inpatient  Remains inpatient appropriate because:IV treatments appropriate due to intensity of illness or inability to take PO   Dispo: The patient is from: Home              Anticipated d/c is to: Home              Anticipated d/c date is: 2-3 days              Patient currently is not medically stable to d/c.    Consultants: Cardiothoracic surgery  Procedures: Chest tube placement  Antimicrobials:  Anti-infectives (From admission, onward)   None      Subjective:  Patient seen and examined the bedside this morning.  Comfortable.  Hemodynamically stable.  There was concern of worsening hypoxia last night.  He was in 6  L of oxygen per minute this morning but not in any kind of respite distress.   Objective: Vitals:   12/15/19 0731 12/15/19 0820 12/15/19 0826 12/15/19 0827  BP: (!) 96/55 (!) 95/59    Pulse: 88 75    Resp: 16     Temp: 98.1 F (36.7 C)     TempSrc:      SpO2: 97%  95% 95%    Intake/Output Summary  (Last 24 hours) at 12/15/2019 0841 Last data filed at 12/15/2019 5027 Gross per 24 hour  Intake 99.81 ml  Output 1740 ml  Net -1640.19 ml   There were no vitals filed for this visit.  Examination:   General exam: Appears calm and comfortable ,Not in distress, pleasant elderly male, morbidly obese HEENT:PERRL,Oral mucosa moist, Ear/Nose normal on gross exam, subcutaneous emphysema on the right face Respiratory system: No wheezes or crackles, mild decreased air entry on the right side, right-sided chest tube  cardiovascular system:Afib. No JVD, murmurs, rubs, gallops or clicks. Gastrointestinal system: Abdomen is nondistended, soft and nontender. No organomegaly or masses felt. Normal bowel sounds heard. Central nervous system: Alert and oriented. No focal neurological deficits. Extremities: No edema, no clubbing ,no cyanosis Skin: No rashes, lesions or ulcers,no icterus ,no pallor   Data Reviewed: I have personally reviewed following labs and imaging studies  CBC: Recent Labs  Lab 12/10/19 0503 12/12/19 0428 12/14/19 0354 12/15/19 0354  WBC 8.4 12.0* 10.0 10.0  NEUTROABS 4.7  --   --   --   HGB 11.9* 11.0* 11.4* 11.2*  HCT 37.0* 34.3* 35.7* 34.9*  MCV 99.2 99.4 100.3* 100.0  PLT 149* 147* 161 741   Basic Metabolic Panel: Recent Labs  Lab 12/10/19 0503 12/11/19 0454 12/12/19 0428 12/14/19 0354 12/15/19 0354  NA 140 138 140 138 137  K 2.9* 4.6 4.2 4.3 3.8  CL 102 98 101 101 99  CO2 25 29 29 26 27   GLUCOSE 148* 120* 103* 108* 109*  BUN 25* 28* 35* 29* 33*  CREATININE 1.34* 1.33* 1.31* 1.36* 1.28*  CALCIUM 8.9 8.9 8.8* 8.9 8.5*  MG  --  2.4  --   --   --    GFR: Estimated Creatinine Clearance: 55.9 mL/min (A) (by C-G formula based on SCr of 1.28 mg/dL (H)). Liver Function Tests: Recent Labs  Lab 12/10/19 0503  AST 29  ALT 18  ALKPHOS 101  BILITOT 1.1  PROT 6.7  ALBUMIN 3.8   No results for input(s): LIPASE, AMYLASE in the last 168 hours. No results for  input(s): AMMONIA in the last 168 hours. Coagulation Profile: Recent Labs  Lab 12/11/19 0454 12/12/19 0428 12/20/2019 0600 12/14/19 0354 12/15/19 0354  INR 3.4* 3.0* 2.1* 1.8* 1.7*   Cardiac Enzymes: No results for input(s): CKTOTAL, CKMB, CKMBINDEX, TROPONINI in the last 168 hours. BNP (last 3 results) No results for input(s): PROBNP in the last 8760 hours. HbA1C: No results for input(s): HGBA1C in the last 72 hours. CBG: Recent Labs  Lab 01/09/2020 0800 12/21/2019 1146 12/25/2019 1659 12/14/19 1213 12/14/19 1613  GLUCAP 94 113* 126* 119* 125*   Lipid Profile: No results for input(s): CHOL, HDL, LDLCALC, TRIG, CHOLHDL, LDLDIRECT in the last 72 hours. Thyroid Function Tests: No results for input(s): TSH, T4TOTAL, FREET4, T3FREE, THYROIDAB in the last 72 hours. Anemia Panel: No results for input(s): VITAMINB12, FOLATE, FERRITIN, TIBC, IRON, RETICCTPCT in the last 72 hours. Sepsis Labs: No results for input(s): PROCALCITON, LATICACIDVEN in the last 168 hours.  Recent  Results (from the past 240 hour(s))  SARS Coronavirus 2 by RT PCR (hospital order, performed in [facility] hospital lab) Nasopharyngeal Nasopharyngeal Swab     Status: None   Collection Time: 12/10/19  5:05 AM   Specimen: Nasopharyngeal Swab  Result Value Ref Range Status   SARS Coronavirus 2 NEGATIVE NEGATIVE Final    Comment: (NOTE) SARS-CoV-2 target nucleic acids are NOT DETECTED. The SARS-CoV-2 RNA is generally detectable in upper and lower respiratory specimens during the acute phase of infection. The lowest concentration of SARS-CoV-2 viral copies this assay can detect is 250 copies / mL. A negative result does not preclude SARS-CoV-2 infection and should not be used as the sole basis for treatment or other patient management decisions.  A negative result may occur with improper specimen collection / handling, submission of specimen other than nasopharyngeal swab, presence of viral mutation(s) within  the areas targeted by this assay, and inadequate number of viral copies (<250 copies / mL). A negative result must be combined with clinical observations, patient history, and epidemiological information. Fact Sheet for Patients:   StrictlyIdeas.no Fact Sheet for Healthcare Providers: BankingDealers.co.za This test is not yet approved or cleared  by the [place] FDA and has been authorized for detection and/or diagnosis of SARS-CoV-2 by FDA under an Emergency Use Authorization (EUA).  This EUA will remain in effect (meaning this test can be used) for the duration of the COVID-19 declaration under Section 564(b)(1) of the Act, 21 U.S.C. section 360bbb-3(b)(1), unless the authorization is terminated or revoked sooner. Performed at [facility], [address], [place], [place] [address]          Radiology Studies: DG Chest 1 View  Result Date: 12/25/2019 CLINICAL DATA:  86 year old male with right pneumothorax. EXAM: CHEST  1 VIEW COMPARISON:  Radiograph dated 12/12/2019 and CT dated 01/06/2020 FINDINGS: Evaluation is very limited due to extensive soft tissue emphysema of the right chest wall. A right upper lobe pneumothorax again noted measuring approximately 15 mm in thickness to the pleural surface relatively similar to prior radiograph. Extensive right chest wall soft tissue emphysema extending to the neck. No other interval change. IMPRESSION: No significant interval change in the appearance of the right upper lobe pneumothorax compared to the prior radiograph. Continued follow-up recommended. Electronically Signed   By: Anner Crete M.D.   On: 01/07/2020 20:11   CT Chest Wo Contrast  Result Date: 12/14/2019 CLINICAL DATA:  Dislodged chest tube EXAM: CT CHEST WITHOUT CONTRAST TECHNIQUE: Multidetector CT imaging of the chest was performed following the standard protocol without IV contrast. COMPARISON:  December 13, 2019 4:26  p.m. FINDINGS: Cardiovascular: There is moderate cardiomegaly and a small pericardial effusion. Coronary artery calcifications are seen. Aortic valve and scattered aortic atherosclerosis is noted. Mediastinum/Nodes: Scattered small mediastinal lymph nodes are again noted. The thyroid gland, trachea and esophagus demonstrate no significant findings. Lungs/Pleura: Again seen is a right-sided chest tube located on the posterior superior pleura and lung apex. There appears to be in debris within the pleural chest tube. A small anterior superior right pneumothorax is again noted, not significantly changed since the prior exam. Small bilateral pleural effusions are seen, left greater than right. Layering calcified gallstones are present. Multiple low-density lesions seen throughout the liver. There is mild misty mesentery within the mid abdomen. Musculoskeletal/Chest wall: Again noted is subcutaneous emphysema along the right lateral and anterior chest wall with small overlying hematoma. No acute fracture is noted. IMPRESSION: Right-sided chest tube in unchanged  position along the posterosuperior pleural space with a small amount of internal debris. Unchanged small anterior superior right pneumothorax. Extensive unchanged subcutaneous emphysema along the anterolateral chest walls. Small bilateral pleural effusions, left greater than right. Electronically Signed   By: Prudencio Pair M.D.   On: 12/14/2019 01:44   CT CHEST WO CONTRAST  Result Date: 01/07/2020 CLINICAL DATA:  Spontaneous pneumothorax on right and status post lateral large caliber chest tube placement on 12/10/2019. CT yesterday demonstrated a persistent anterior pneumothorax and extension of the tip of the indwelling chest tube just into the posterior mediastinum. Plan for second smaller caliber chest tube placement to treat the residual component of pneumothorax. EXAM: CT CHEST WITHOUT CONTRAST TECHNIQUE: Multidetector CT imaging of the chest was performed  following the standard protocol without IV contrast. COMPARISON:  12/12/2019 FINDINGS: Cardiovascular: Stable cardiac enlargement, trace pericardial fluid and calcified coronary artery plaque. Mediastinum/Nodes: Stable scattered mediastinal lymph nodes, some of which are borderline/mildly enlarged. The largest is in the AP window measuring approximately 13 mm in short axis. Overall volume of pneumomediastinum has decreased slightly since the prior CT yesterday. Lungs/Pleura: The tip of the indwelling right-sided chest tube has now been retracted slightly out of the mediastinum and is now located in the superior and medial aspect of the pleural space near the right apex. There is resultant significant decrease in the residual pneumothorax with only a small anterior pneumothorax remaining which is currently too small to accommodate a chest tube. Stable small left pleural effusion and trace right pleural effusion. Stable lateral blebs adjacent to the right upper lobe and small anterior blebs of the left lung. Musculoskeletal/Soft Tissues: Overall amount of subcutaneous emphysema in the right chest wall, right axilla and extending into the right neck shows slight enlargement since the prior CT yesterday. IMPRESSION: 1. The tip of the indwelling right-sided chest tube has now been retracted slightly out of the mediastinum and is now located in the superior and medial aspect of the pleural space near the right apex. There is resultant significant decrease in the residual right pneumothorax with only a small anterior pneumothorax remaining which is currently too small to accommodate a chest tube. 2. Overall amount of subcutaneous emphysema in the right chest wall, right axilla and extending into the right neck shows slight enlargement since the prior CT yesterday. 3. Volume of pneumomediastinum shows slight decreased since the prior CT yesterday. 4. Stable small left pleural effusion and trace right pleural effusion. 5.  Stable cardiac enlargement, trace pericardial fluid and calcified coronary artery plaque. 6. Stable borderline/mildly enlarged mediastinal lymph nodes. Electronically Signed   By: Aletta Edouard M.D.   On: 12/20/2019 17:06   DG CHEST PORT 1 VIEW  Result Date: 12/14/2019 CLINICAL DATA:  Shortness of breath. EXAM: PORTABLE CHEST 1 VIEW COMPARISON:  Chest CT earlier today, additional prior exams. FINDINGS: Right chest tube not definitively visualized. Prominent subcutaneous emphysema throughout the right chest wall and supraclavicular soft tissues, partially obscuring evaluation for pneumothorax. Probable pleural line at the right lung apex, similar to prior and consistent with pneumothorax. Pneumomediastinum on CT visualized about the superior aspect. Small pleural effusions on CT not as well visualized. Unchanged heart size and mediastinal contours. Aortic atherosclerosis. IMPRESSION: 1. Right chest tube no longer seen. No other interval change from prior exam. 2. Small right apical pneumothorax partially obscured by subcutaneous emphysema but grossly stable. Pneumomediastinum with subcutaneous emphysema about the right chest wall and bilateral supraclavicular soft tissues. 3. Pleural effusions on CT earlier today  not as well visualized. Stable cardiomegaly. Electronically Signed   By: Keith Rake M.D.   On: 12/14/2019 21:58   DG Chest Portable 1 View  Result Date: 01/09/2020 CLINICAL DATA:  86 year old male with right pneumothorax. EXAM: PORTABLE CHEST 1 VIEW COMPARISON:  Earlier radiograph dated 12/20/2019. FINDINGS: Apparent pleural reflection along the right upper lobe concerning for a small pneumothorax. No significant interval change since the prior radiograph. Evaluation is limited due to overlying soft tissue emphysema. No other interval change. IMPRESSION: No significant interval change since the prior radiograph. Electronically Signed   By: Anner Crete M.D.   On: 12/29/2019 23:22         Scheduled Meds: . bisacodyl  10 mg Rectal Once  . diltiazem  240 mg Oral Daily  . mometasone-formoterol  2 puff Inhalation BID  . polyethylene glycol  17 g Oral Daily  . senna-docusate  1 tablet Oral BID  . simvastatin  40 mg Oral q1800  . sodium chloride flush  3 mL Intravenous Q12H  . sodium chloride flush  3 mL Intravenous Q12H  . torsemide  100 mg Oral Daily  . umeclidinium bromide  1 puff Inhalation Daily   Continuous Infusions: . sodium chloride    . heparin 1,450 Units/hr (12/15/19 0230)     LOS: 1 day    Time spent: 35 mins,More than 50% of that time was spent in counseling and/or coordination of care.      Shelly Coss, MD [facility] P6/11/2019, 8:41 AM

## 2019-12-15 NOTE — Progress Notes (Signed)
Chest tube suction decreased to -20 from -30.   Dressing around insertion sits remains in place, with old bloody drainage marked.   Family at bedside. No needs voiced at this time.

## 2019-12-15 NOTE — Significant Event (Addendum)
Rapid Response Event Note  Overview:  Pt here from [facility] with R sided PTX with chest tube placement. After chest tube placement, pt developed SQ air. Pt was then transported to [facility] for CVTS management of PTX.   RN called RRT d/t concern of increasing SQ air and increased FiO2 demand.  Initial Focused Assessment: Pt laying in bed with eyes open in no distress. Pt is alert and oriented, denies chest pain/SOB. Per RN, pt's voice sounded different than it did earlier in the shift.  Pt with large area SQ air on R chest which extends up to R neck and R eye. R sided CT site assessed. CT is sutured in place-it has xeroform around the site with gauze and tape. There is swelling and large area of bruising around site.  [place] CT drain assessed. It is attached to sx with no air leak and serosang drainage. Lung sounds diminished t/o R>L. Skin warm and dry. T-97.6, HR-74, BP-109/62, RR-18, SpO2-92-95% on 6L [place](Pt has been on 3L [place] previously)  Interventions: CT sx increased to 30 cm  Frequent vital signs Plan of Care (if not transferred): Suction increased on CT per CVTS request. Will do more frequent vital signs and continue to monitor pt closely. Call RRT if further assistance needed.  Event Summary:  Kennon Holter, NP notified by bedside RN Dr. Prescott Gum notified at [place]: [place] Arrived: 0205 Ended: 0245  D[place]

## 2019-12-15 NOTE — Progress Notes (Signed)
ANTICOAGULATION CONSULT NOTE - Initial Consult  Pharmacy Consult for heparin Indication: atrial fibrillation  No Known Allergies  Patient Measurements:   Heparin Dosing Weight: 102kg  Vital Signs: Temp: 98.1 F (36.7 C) (06/05 0731) Temp Source: Oral (06/05 0620) BP: 96/55 (06/05 0731) Pulse Rate: 88 (06/05 0731)  Labs: Recent Labs    12/20/2019 0600 12/14/19 0354 12/14/19 1625 12/15/19 0354  HGB  --  11.4*  --  11.2*  HCT  --  35.7*  --  34.9*  PLT  --  161  --  165  LABPROT 23.2* 20.1*  --  19.0*  INR 2.1* 1.8*  --  1.7*  HEPARINUNFRC  --   --  0.30 0.33  CREATININE  --  1.36*  --  1.28*    Estimated Creatinine Clearance: 55.9 mL/min (A) (by C-G formula based on SCr of 1.28 mg/dL (H)).   Medical History: Past Medical History:  Diagnosis Date  . ATRIAL FIBRILLATION 03/05/2010   Qualifier: Diagnosis of  By: Lovena Le, MD, Martyn Malay   . Benign hypertension 12/16/2013  . Cancer ([place])    bladder  . Chronic obstructive pulmonary disease ([place]) 12/16/2013  . COPD (chronic obstructive pulmonary disease) ([place])   . Family history of adverse reaction to anesthesia    Brother has a difficult time waking up.  Marland Kitchen Hx pulmonary embolism 08/2000  . Hypercholesterolemia   . Hyperglycemia   . Hypertension   . Osteoarthritis   . Pre-diabetes   . PREMATURE VENTRICULAR CONTRACTIONS 03/05/2010   Qualifier: Diagnosis of  By: Lovena Le, MD, Martyn Malay   . Pulmonary embolism ([place]) 12/16/2013  . Stroke [facility]) 1997    Assessment: 63 YOM transfer from [facility] with pneumothorax/chest tube, on warfarin PTA for Afib/hx PE, warfarin held for possible procedure. Pharmacy consulted to dose heparin for now.  Heparin level at goal (0.33) this morning with no s/sx of bleeding noted. INR today subtherapeutic at 1.7. H/H low but stable, plts 165.   Goal of Therapy:  Heparin level 0.3-0.7 units/ml Monitor platelets by anticoagulation protocol: Yes   Plan:  Continue heparin gtt at 1450  units/hr, no bolus Monitor daily HL, CBC Monitor for signs/symptoms of bleeding   Agnes Lawrence, PharmD PGY1 Pharmacy Resident  Please check AMION for all [place] phone numbers After 10:00 PM, call [place] [phone]

## 2019-12-15 NOTE — Progress Notes (Addendum)
      [address]       [name] [address]             [phone]           Subjective: Events of earlier this am noted. Patient thinks his breathing is fine  Objective: Vital signs in last 24 hours: Temp:  [97.6 F (36.4 C)-98.1 F (36.7 C)] 98.1 F (36.7 C) (06/05 0731) Pulse Rate:  [67-88] 75 (06/05 0820) Cardiac Rhythm: Atrial fibrillation;Bundle branch block (06/05 0939) Resp:  [16-20] 16 (06/05 0731) BP: (95-115)/(52-63) 95/59 (06/05 0820) SpO2:  [89 %-98 %] 95 % (06/05 0827) FiO2 (%):  [44 %] 44 % (06/05 0827)     Intake/Output from previous day: 06/04 0701 - 06/05 0700 In: 99.8 [I.V.:99.8] Out: 1240 [Urine:1200; Chest Tube:40]   Physical Exam:  Cardiovascular: RRR Pulmonary: Clear to auscultation bilaterally. Subcutaneous emphysema right side of face/eye,neck Extremities: Chronic venous stasis changes Wounds: Bloody ooze on dressing;Superficial bruising/ecchymosis right lateral chest wall Chest Tube: to 30 cm suction, no air leak. Chest tube is well secured to right lateral chest  Lab Results: CBC: Recent Labs    12/14/19 0354 12/15/19 0354  WBC 10.0 10.0  HGB 11.4* 11.2*  HCT 35.7* 34.9*  PLT 161 165   BMET:  Recent Labs    12/14/19 0354 12/15/19 0354  NA 138 137  K 4.3 3.8  CL 101 99  CO2 26 27  GLUCOSE 108* 109*  BUN 29* 33*  CREATININE 1.36* 1.28*  CALCIUM 8.9 8.5*    PT/INR:  Recent Labs    12/15/19 0354  LABPROT 19.0*  INR 1.7*   ABG:  INR: Will add last result for INR, ABG once components are confirmed Will add last 4 CBG results once components are confirmed  Assessment/Plan:  1. CV - A fib. 2.  Pulmonary - Chest tube is to suction, no air leak. Chest tube with 40 cc last 24 hours. CXR appears stable. On 6 liters of oxygen via [place]. Will decrease suction to - 20 cm as discussed with Dr. Prescott Gum. Check CXR in am.  [name] Gahm M ZimmermanPA-C 12/15/2019,10:51 AM [phone]

## 2019-12-15 NOTE — Progress Notes (Signed)
Rapid response called to assist with chest tube pt  Rt eye appeared more swollen and the right sided also more swollen. Prior to this event patient had pulled out IV and had attempted to get OOB pt was cleaned up he was alert and oriented. [facility] was call and informed to call Cardio Thoracic for chest tube management. MD on call notified informed of vitals and chest pain result. New Orders received will continue to monitor. Arthor Captain LPN

## 2019-12-16 ENCOUNTER — Inpatient Hospital Stay (HOSPITAL_COMMUNITY): Payer: Medicare Other

## 2019-12-16 DIAGNOSIS — J942 Hemothorax: Secondary | ICD-10-CM

## 2019-12-16 DIAGNOSIS — J939 Pneumothorax, unspecified: Secondary | ICD-10-CM

## 2019-12-16 LAB — PROTIME-INR
INR: 1.6 — ABNORMAL HIGH (ref 0.8–1.2)
Prothrombin Time: 18.7 seconds — ABNORMAL HIGH (ref 11.4–15.2)

## 2019-12-16 LAB — HEPARIN LEVEL (UNFRACTIONATED): Heparin Unfractionated: 0.46 IU/mL (ref 0.30–0.70)

## 2019-12-16 LAB — ABO/RH: ABO/RH(D): A POS

## 2019-12-16 LAB — BASIC METABOLIC PANEL
Anion gap: 10 (ref 5–15)
BUN: 32 mg/dL — ABNORMAL HIGH (ref 8–23)
CO2: 26 mmol/L (ref 22–32)
Calcium: 8.2 mg/dL — ABNORMAL LOW (ref 8.9–10.3)
Chloride: 99 mmol/L (ref 98–111)
Creatinine, Ser: 1.55 mg/dL — ABNORMAL HIGH (ref 0.61–1.24)
GFR calc Af Amer: 47 mL/min — ABNORMAL LOW (ref >60)
GFR calc non Af Amer: 40 mL/min — ABNORMAL LOW (ref >60)
Glucose, Bld: 136 mg/dL — ABNORMAL HIGH (ref 70–99)
Potassium: 4 mmol/L (ref 3.5–5.1)
Sodium: 135 mmol/L (ref 135–145)

## 2019-12-16 LAB — TYPE AND SCREEN
ABO/RH(D): A POS
Antibody Screen: NEGATIVE

## 2019-12-16 LAB — CBC
HCT: 33.1 % — ABNORMAL LOW (ref 39.0–52.0)
Hemoglobin: 10.5 g/dL — ABNORMAL LOW (ref 13.0–17.0)
MCH: 31.9 pg (ref 26.0–34.0)
MCHC: 31.7 g/dL (ref 30.0–36.0)
MCV: 100.6 fL — ABNORMAL HIGH (ref 80.0–100.0)
Platelets: 208 10*3/uL (ref 150–400)
RBC: 3.29 MIL/uL — ABNORMAL LOW (ref 4.22–5.81)
RDW: 15.8 % — ABNORMAL HIGH (ref 11.5–15.5)
WBC: 19.8 10*3/uL — ABNORMAL HIGH (ref 4.0–10.5)
nRBC: 0 % (ref 0.0–0.2)

## 2019-12-16 LAB — APTT: aPTT: 137 seconds — ABNORMAL HIGH (ref 24–36)

## 2019-12-16 LAB — HEMOGLOBIN AND HEMATOCRIT, BLOOD
HCT: 31.8 % — ABNORMAL LOW (ref 39.0–52.0)
Hemoglobin: 10 g/dL — ABNORMAL LOW (ref 13.0–17.0)

## 2019-12-16 LAB — MRSA PCR SCREENING: MRSA by PCR: NEGATIVE

## 2019-12-16 MED ORDER — SODIUM CHLORIDE 0.9 % IV BOLUS
250.0000 mL | Freq: Once | INTRAVENOUS | Status: AC
  Administered 2019-12-16: 250 mL via INTRAVENOUS

## 2019-12-16 MED ORDER — ACETAMINOPHEN 650 MG RE SUPP: 650.0000 mg | Freq: Four times a day (QID) | RECTAL | Status: DC | PRN

## 2019-12-16 MED ORDER — MORPHINE SULFATE (PF) 4 MG/ML IV SOLN
INTRAVENOUS | Status: AC
  Filled 2019-12-16: qty 1

## 2019-12-16 MED ORDER — MORPHINE 100MG IN NS 100ML (1MG/ML) PREMIX INFUSION
0.0000 mg/h | INTRAVENOUS | Status: DC
  Administered 2019-12-16: 5 mg/h via INTRAVENOUS
  Filled 2019-12-16: qty 100

## 2019-12-16 MED ORDER — INSULIN ASPART 100 UNIT/ML ~~LOC~~ SOLN: 0.0000 [IU] | SUBCUTANEOUS | Status: DC

## 2019-12-16 MED ORDER — MAGNESIUM SULFATE 2 GM/50ML IV SOLN
2.0000 g | Freq: Once | INTRAVENOUS | Status: DC
  Filled 2019-12-16: qty 50

## 2019-12-16 MED ORDER — POLYVINYL ALCOHOL 1.4 % OP SOLN
1.0000 [drp] | Freq: Four times a day (QID) | OPHTHALMIC | Status: DC | PRN
  Filled 2019-12-16: qty 15

## 2019-12-16 MED ORDER — IPRATROPIUM BROMIDE 0.02 % IN SOLN: 0.5000 mg | Freq: Four times a day (QID) | RESPIRATORY_TRACT | Status: DC

## 2019-12-16 MED ORDER — MORPHINE SULFATE (PF) 2 MG/ML IV SOLN: 2.0000 mg | INTRAVENOUS | Status: DC | PRN

## 2019-12-16 MED ORDER — MORPHINE BOLUS VIA INFUSION
5.0000 mg | INTRAVENOUS | Status: DC | PRN
  Filled 2019-12-16: qty 5

## 2019-12-16 MED ORDER — VANCOMYCIN HCL 2000 MG/400ML IV SOLN
2000.0000 mg | Freq: Once | INTRAVENOUS | Status: DC
  Filled 2019-12-16: qty 400

## 2019-12-16 MED ORDER — MIDAZOLAM HCL 2 MG/2ML IJ SOLN: 2.0000 mg | INTRAMUSCULAR | Status: DC | PRN

## 2019-12-16 MED ORDER — PHENYLEPHRINE HCL-NACL 10-0.9 MG/250ML-% IV SOLN
0.0000 ug/min | INTRAVENOUS | Status: DC
  Administered 2019-12-16: 20 ug/min via INTRAVENOUS
  Filled 2019-12-16: qty 250

## 2019-12-16 MED ORDER — CHLORHEXIDINE GLUCONATE CLOTH 2 % EX PADS: 6.0000 | MEDICATED_PAD | Freq: Every day | CUTANEOUS | Status: DC

## 2019-12-16 MED ORDER — VANCOMYCIN HCL IN DEXTROSE 1-5 GM/200ML-% IV SOLN: 1000.0000 mg | INTRAVENOUS | Status: DC

## 2019-12-16 MED ORDER — SODIUM CHLORIDE 0.9 % IV SOLN
2.0000 g | Freq: Two times a day (BID) | INTRAVENOUS | Status: DC
  Filled 2019-12-16 (×2): qty 2

## 2019-12-16 MED ORDER — ACETAMINOPHEN 325 MG PO TABS: 650.0000 mg | ORAL_TABLET | Freq: Four times a day (QID) | ORAL | Status: DC | PRN

## 2019-12-16 MED ORDER — GLYCOPYRROLATE 0.2 MG/ML IJ SOLN: 0.2000 mg | INTRAMUSCULAR | Status: DC | PRN

## 2019-12-16 MED ORDER — GLYCOPYRROLATE 1 MG PO TABS: 1.0000 mg | ORAL_TABLET | ORAL | Status: DC | PRN

## 2019-12-16 MED ORDER — BUDESONIDE 0.5 MG/2ML IN SUSP: 0.5000 mg | Freq: Two times a day (BID) | RESPIRATORY_TRACT | Status: DC

## 2019-12-16 MED ORDER — DIPHENHYDRAMINE HCL 50 MG/ML IJ SOLN: 25.0000 mg | INTRAMUSCULAR | Status: DC | PRN

## 2019-12-16 MED ORDER — ARFORMOTEROL TARTRATE 15 MCG/2ML IN NEBU
15.0000 ug | INHALATION_SOLUTION | Freq: Two times a day (BID) | RESPIRATORY_TRACT | Status: DC
  Filled 2019-12-16: qty 2

## 2019-12-16 MED ORDER — IPRATROPIUM BROMIDE 0.02 % IN SOLN: 0.5000 mg | Freq: Two times a day (BID) | RESPIRATORY_TRACT | Status: DC

## 2019-12-16 MED ORDER — SODIUM CHLORIDE 0.9 % IV BOLUS
1000.0000 mL | Freq: Once | INTRAVENOUS | Status: AC
  Administered 2019-12-16: 1000 mL via INTRAVENOUS

## 2019-12-16 MED ORDER — PANTOPRAZOLE SODIUM 40 MG IV SOLR: 40.0000 mg | Freq: Every day | INTRAVENOUS | Status: DC

## 2019-12-16 MED ORDER — DEXTROSE 5 % IV SOLN: INTRAVENOUS | Status: DC

## 2019-12-16 MED ORDER — GLYCOPYRROLATE 0.2 MG/ML IJ SOLN
0.2000 mg | INTRAMUSCULAR | Status: DC | PRN
  Administered 2019-12-16: 0.2 mg via INTRAVENOUS
  Filled 2019-12-16: qty 1

## 2019-12-20 ENCOUNTER — Ambulatory Visit: Payer: Medicare Other | Admitting: Family

## 2020-01-10 NOTE — Progress Notes (Signed)
ANTICOAGULATION CONSULT NOTE - Initial Consult  Pharmacy Consult for heparin Indication: atrial fibrillation  No Known Allergies  Patient Measurements: Height: 5\' 11"  (180.3 cm) Weight: 121 kg (266 lb 12.8 oz) IBW/kg (Calculated) : 75.3 Heparin Dosing Weight: 102kg  Vital Signs: Temp: 97.7 F (36.5 C) (06/06 0724) Temp Source: Oral (06/06 0452) BP: 76/40 (06/06 0724) Pulse Rate: 109 (06/06 0724)  Labs: Recent Labs    12/14/19 0354 12/14/19 0354 12/14/19 1625 12/15/19 0354 04-Jan-2020 0447  HGB 11.4*   < >  --  11.2* 10.5*  HCT 35.7*  --   --  34.9* 33.1*  PLT 161  --   --  165 208  LABPROT 20.1*  --   --  19.0* 18.7*  INR 1.8*  --   --  1.7* 1.6*  HEPARINUNFRC  --   --  0.30 0.33 0.46  CREATININE 1.36*  --   --  1.28* 1.55*   < > = values in this interval not displayed.    Estimated Creatinine Clearance: 46.1 mL/min (A) (by C-G formula based on SCr of 1.55 mg/dL (H)).   Medical History: Past Medical History:  Diagnosis Date  . ATRIAL FIBRILLATION 03/05/2010   Qualifier: Diagnosis of  By: Lovena Le, MD, Martyn Malay   . Benign hypertension 12/16/2013  . Cancer ([place])    bladder  . Chronic obstructive pulmonary disease ([place]) 12/16/2013  . COPD (chronic obstructive pulmonary disease) ([place])   . Family history of adverse reaction to anesthesia    Brother has a difficult time waking up.  Marland Kitchen Hx pulmonary embolism 08/2000  . Hypercholesterolemia   . Hyperglycemia   . Hypertension   . Osteoarthritis   . Pre-diabetes   . PREMATURE VENTRICULAR CONTRACTIONS 03/05/2010   Qualifier: Diagnosis of  By: Lovena Le, MD, Martyn Malay   . Pulmonary embolism ([place]) 12/16/2013  . Stroke [facility] 1997    Assessment: 36 YOM transfer from [facility] with pneumothorax/chest tube, on warfarin PTA for Afib/hx PE, warfarin held for possible procedure. Pharmacy consulted to dose heparin for now.  Heparin level at goal (0.46) this morning with no s/sx of bleeding noted. Hemoglobin down to 10.5,  platelet count WNL and stable.   Goal of Therapy:  Heparin level 0.3-0.7 units/ml Monitor platelets by anticoagulation protocol: Yes   Plan:  Continue heparin gtt at 1450 units/hr Monitor daily HL, CBC Monitor for signs/symptoms of bleeding   Agnes Lawrence, PharmD PGY1 Pharmacy Resident  Please check AMION for all [place] phone numbers After 10:00 PM, call [place] [phone]

## 2020-01-10 NOTE — Significant Event (Addendum)
Rapid Response Event Note  Overview: SOB and increased oxygen requirement due to sats 83%  Initial Focused Assessment: I was notified by nursing staff of pt with desaturations to 83% and c/o SOB. Upon arrival, pt was tachypneic RR 24 with mild accessory muscle use and endorsed SOB. BBS Rhonchi with diminished bases. Skin is bruised in multiple areas, pink, warm and dry.  Subcutaneous emphysema is reportedly improved but is present in right side, chest, neck and face. Pt reports no trouble swallowing or change in his voice. Right CT dressing is fully saturated with serosanginous drainage and some clots surrounding the insertion site. Dressing changed and vaseline gauze dressing applied. CT with multiple small clots and dependent loops. Suction adjusted to ordered amount of 20cc H2O and tube milked to facilitate drainage. Shortly after redressing and ensuring tube drainage, pt said he was having less SOB and his WOB improved. Pt already has a PCXR ordered for this morning so an additional xray was not ordered.  0300- 98.2 F, HR 126 afib, 108/66, RR 18 with sats 96% on 10L HFNC.   Interventions: -Redressed CT dressing -Increased sxn to 20 cc H20 (was at 15cc H20)  Plan of Care (if not transferred): -Wean oxygen for sats greater than 92% as tolerated -Call primary svc and/or RRRN for further assistance  Event Summary: Call received 0249 Arrived at call 0255 Call ended [place]  Madelynn Done

## 2020-01-10 NOTE — Progress Notes (Signed)
PROGRESS NOTE    [name] Hardin  HQP:[id number] DOB: Sep 06, 1931 DOA: 12/18/2019 PCP: [name], MD   Brief Narrative: Patient is 86 year old male with history of COPD, proximal A. fib, PE on Coumadin, chronic systolic congestive heart failure, hypertension who presented to the emergency department at [facility] on 12/10/2019 with acute onset of shortness of breath and chest discomfort. On presentation he was saturating in the 80s on room air. He was initially treated for COPD exacerbation. He was also in A. fib with RVR. Imaging showed right-sided pneumothorax with concern for tension component and chest tube was placed. Respiratory status improved significantly after chest tube placement, oxygen weaned down to 2 L/min. Repeat imaging demonstrated slight increase in pneumothorax, subcutaneous emphysema so he was transferred to [name] for cardiothoracic surgery evaluation.  He was on conservative management, chest tube is to suction. Early this morning, rapid response was called because he became hypotensive and he desaturated requiring 15 L of high flow oxygen.  PCCM consulted earlier this morning.  He was found to have a lot of clots in the chest tube.  Patient transferred to ICU, now requiring pressor support for blood pressure maintenance.   Assessment & Plan:   Principal Problem:   Pneumothorax, right Active Problems:   Atrial fibrillation with RVR (HCC)   COPD (chronic obstructive pulmonary disease) (HCC)   Chronic kidney disease (CKD), stage III (moderate)   Diabetes mellitus, type 2 (HCC)   Acute on chronic systolic CHF (congestive heart failure) (HCC)   Acute respiratory failure with hypoxia (HCC)   Pressure injury of skin   Acute hypoxic respiratory failure: Early this morning, rapid response was called because he became hypotensive and he desaturated requiring 15 L of high flow oxygen.  PCCM consulted earlier this morning.  He was found to have a lot of clots  in the chest tube.  Patient transferred to ICU, now requiring pressor support for blood pressure maintenance.  Spontaneous pneumothorax: Presented with shortness of breath and chest discomfort. Imaging showed right-sided pneumothorax with concern for tension component. [facility] at [facility]. He has soft tissue emphysema. There was concern for chest tube displacement so he was sent to [name] for cardiothoracic surgery evaluation. Cardiothoracic surgery was following. He was on chest tube to suction, pain control, supportive care.  Chest x-ray this morning showed worsening aeration on the right lung.  A. fib with RVR: Required IV Cardizem for rate control, now maintaining rate on oral Cardizem. CHADS-VASc is at least 7 (age x2, CAD, CHF) . On warfarin for anticoagulation at home. On IV heparin here.  Acute on chronic systolic congestive heart failure: Currently compensated. Was treated with IV Lasix.  On torsemide at home.  Required blood pressure boluses for hypotension today.  Torsemide discontinued   COPD: There was concern for COPD exacerbation on presentation. Currently stable. No wheezing continue home inhalers.  CKD stage IIIa: Currently kidney function at baseline.  Coronary artery disease: No anginal complaints. Continue statin and ASA  Leukocytosis: Acute.  Could be associated with acute process on the right lung versus healthcare associated pneumonia.  Started on broad-spectrum antibiotics.          DVT prophylaxis: IV heparin Code Status: DNR Family Communication: Daughter on 12/15/2019. Status is: Inpatient  Remains inpatient appropriate because:IV treatments appropriate due to intensity of illness or inability to take PO   Dispo: The patient is from: Home  Anticipated d/c is to: Home              Anticipated d/c date is: 2-3 days              Patient currently is not medically stable to d/c.  Patient transferred to ICU.  Consultants:  Cardiothoracic surgery  Procedures: Chest tube placement  Antimicrobials:  Anti-infectives (From admission, onward)   Start     Dose/Rate Route Frequency Ordered Stop   12/17/19 0830  vancomycin (VANCOCIN) IVPB 1000 mg/200 mL premix  Status:  Discontinued     1,000 mg 200 mL/hr over 60 Minutes Intravenous Every 24 hours 12/18/19 0817 18-Dec-2019 1007   12-18-2019 0815  ceFEPIme (MAXIPIME) 2 g in sodium chloride 0.9 % 100 mL IVPB  Status:  Discontinued     2 g 200 mL/hr over 30 Minutes Intravenous Every 12 hours 2019/12/18 0800 2019/12/18 1007   12-18-19 0815  vancomycin (VANCOREADY) IVPB 2000 mg/400 mL     2,000 mg 200 mL/hr over 120 Minutes Intravenous  Once 2019-12-18 0801        Subjective:  Patient seen and examined at the bedside this morning.  He was lethargic, requiring high flow oxygen.  He was dyspneic with tachypnea.  Hypotensive.  Alert and oriented Objective: Vitals:   12/18/19 0835 18-Dec-2019 0916 Dec 18, 2019 1000 12/18/2019 1015  BP:   (!) 62/50 (!) 71/45  Pulse: (!) 132  (!) 107 68  Resp: 20 14 13 18   Temp:      TempSrc:      SpO2: 95% 100% 99% 99%  Weight:      Height:        Intake/Output Summary (Last 24 hours) at December 18, 2019 1029 Last data filed at Dec 18, 2019 0500 Gross per 24 hour  Intake 930.39 ml  Output 3500 ml  Net -2569.61 ml   Filed Weights   12/15/19 1825  Weight: 121 kg    Examination:   General exam: Lethargic, elderly gentleman  Respiratory system: Severely decreased air entry on the right side, tachypnea, right sided chest tube cardiovascular system:Afib. No JVD, murmurs, rubs, gallops or clicks. Gastrointestinal system: Abdomen is nondistended, soft and nontender. No organomegaly or masses felt. Normal bowel sounds heard. Central nervous system: Alert and oriented. No focal neurological deficits. Extremities: No edema, no clubbing ,no cyanosis Skin: No rashes, lesions or ulcers,no icterus ,no pallor   Data Reviewed: I have personally reviewed  following labs and imaging studies  CBC: Recent Labs  Lab 12/10/19 0503 12/10/19 0503 12/12/19 0428 12/14/19 0354 12/15/19 0354 12-18-2019 0447 December 18, 2019 0858  WBC 8.4  --  12.0* 10.0 10.0 19.8*  --   NEUTROABS 4.7  --   --   --   --   --   --   HGB 11.9*   < > 11.0* 11.4* 11.2* 10.5* 10.0*  HCT 37.0*   < > 34.3* 35.7* 34.9* 33.1* 31.8*  MCV 99.2  --  99.4 100.3* 100.0 100.6*  --   PLT 149*  --  147* 161 165 208  --    < > = values in this interval not displayed.   Basic Metabolic Panel: Recent Labs  Lab 12/11/19 0454 12/12/19 0428 12/14/19 0354 12/15/19 0354 December 18, 2019 0447  NA 138 140 138 137 135  K 4.6 4.2 4.3 3.8 4.0  CL 98 101 101 99 99  CO2 29 29 26 27 26   GLUCOSE 120* 103* 108* 109* 136*  BUN 28* 35* 29* 33* 32*  CREATININE 1.33*  1.31* 1.36* 1.28* 1.55*  CALCIUM 8.9 8.8* 8.9 8.5* 8.2*  MG 2.4  --   --   --   --    GFR: Estimated Creatinine Clearance: 46.1 mL/min (A) (by C-G formula based on SCr of 1.55 mg/dL (H)). Liver Function Tests: Recent Labs  Lab 12/10/19 0503  AST 29  ALT 18  ALKPHOS 101  BILITOT 1.1  PROT 6.7  ALBUMIN 3.8   No results for input(s): LIPASE, AMYLASE in the last 168 hours. No results for input(s): AMMONIA in the last 168 hours. Coagulation Profile: Recent Labs  Lab 12/12/19 0428 01/05/2020 0600 12/14/19 0354 12/15/19 0354 Dec 22, 2019 0447  INR 3.0* 2.1* 1.8* 1.7* 1.6*   Cardiac Enzymes: No results for input(s): CKTOTAL, CKMB, CKMBINDEX, TROPONINI in the last 168 hours. BNP (last 3 results) No results for input(s): PROBNP in the last 8760 hours. HbA1C: No results for input(s): HGBA1C in the last 72 hours. CBG: Recent Labs  Lab 12/11/2019 0800 12/24/2019 1146 12/31/2019 1659 12/14/19 1213 12/14/19 1613  GLUCAP 94 113* 126* 119* 125*   Lipid Profile: No results for input(s): CHOL, HDL, LDLCALC, TRIG, CHOLHDL, LDLDIRECT in the last 72 hours. Thyroid Function Tests: No results for input(s): TSH, T4TOTAL, FREET4, T3FREE,  THYROIDAB in the last 72 hours. Anemia Panel: No results for input(s): VITAMINB12, FOLATE, FERRITIN, TIBC, IRON, RETICCTPCT in the last 72 hours. Sepsis Labs: No results for input(s): PROCALCITON, LATICACIDVEN in the last 168 hours.  Recent Results (from the past 240 hour(s))  SARS Coronavirus 2 by RT PCR (hospital order, performed in [facility] hospital lab) Nasopharyngeal Nasopharyngeal Swab     Status: None   Collection Time: 12/10/19  5:05 AM   Specimen: Nasopharyngeal Swab  Result Value Ref Range Status   SARS Coronavirus 2 NEGATIVE NEGATIVE Final    Comment: (NOTE) SARS-CoV-2 target nucleic acids are NOT DETECTED. The SARS-CoV-2 RNA is generally detectable in upper and lower respiratory specimens during the acute phase of infection. The lowest concentration of SARS-CoV-2 viral copies this assay can detect is 250 copies / mL. A negative result does not preclude SARS-CoV-2 infection and should not be used as the sole basis for treatment or other patient management decisions.  A negative result may occur with improper specimen collection / handling, submission of specimen other than nasopharyngeal swab, presence of viral mutation(s) within the areas targeted by this assay, and inadequate number of viral copies (<250 copies / mL). A negative result must be combined with clinical observations, patient history, and epidemiological information. Fact Sheet for Patients:   StrictlyIdeas.no Fact Sheet for Healthcare Providers: BankingDealers.co.za This test is not yet approved or cleared  by the [place] FDA and has been authorized for detection and/or diagnosis of SARS-CoV-2 by FDA under an Emergency Use Authorization (EUA).  This EUA will remain in effect (meaning this test can be used) for the duration of the COVID-19 declaration under Section 564(b)(1) of the Act, 21 U.S.C. section 360bbb-3(b)(1), unless the authorization is  terminated or revoked sooner. Performed at [facility], [address] [place], [place] [address]   MRSA PCR Screening     Status: None   Collection Time: 2019/12/22  8:34 AM   Specimen: Nasopharyngeal  Result Value Ref Range Status   MRSA by PCR NEGATIVE NEGATIVE Final    Comment:        The GeneXpert MRSA Assay (FDA approved for NASAL specimens only), is one component of a comprehensive MRSA colonization surveillance program. It is not intended  to diagnose MRSA infection nor to guide or monitor treatment for MRSA infections. Performed at [facility], [place] [address], [place], [place] [address]          Radiology Studies: DG CHEST PORT 1 VIEW  Result Date: Jan 14, 2020 CLINICAL DATA:  Follow-up pneumothorax. EXAM: PORTABLE CHEST 1 VIEW COMPARISON:  12/15/2019 and older exams. FINDINGS: Tiny pneumothorax suggested on the previous day's exam is not visualized. There is no current evidence of a pneumothorax, exam limited on the right due to overlying subcutaneous emphysema. There is worsened lung aeration with increased opacity in the right lower lung, now obscuring the hemidiaphragm. This is consistent with a combination of pleural fluid and either atelectasis or infection. Mild opacity at the left lung base is stable, likely a combination of atelectasis and a small effusion. Remainder of the left lung is clear. Extensive, predominantly right-sided, subcutaneous emphysema is unchanged from the previous day's study. IMPRESSION: 1. Worsened lung aeration on the right compared to the previous day's exam. Increased opacity has developed at the lung base obscuring the hemidiaphragm. This is likely a combination of pleural fluid with either atelectasis or pneumonia. 2. No convincing pneumothorax and no other change from the previous day's exam. Electronically Signed   By: Lajean Manes M.D.   On: January 14, 2020 06:58   DG Chest Port 1 View  Result Date: 12/15/2019 CLINICAL DATA:   Chest tube. EXAM: PORTABLE CHEST 1 VIEW COMPARISON:  Chest radiograph December 14, 2019. FINDINGS: Monitoring leads overlie the patient. Stable cardiomegaly. Similar pneumomediastinum. Bibasilar opacities favored to represent atelectasis. Extensive subcutaneous emphysema along the right lateral chest wall and lower neck. This limits evaluation however there is a possible persistent trace right apical pneumothorax. IMPRESSION: Evaluation limited due to overlapping subcutaneous emphysema. Possible tiny left apical pneumothorax. Electronically Signed   By: Lovey Newcomer M.D.   On: 12/15/2019 13:37   DG CHEST PORT 1 VIEW  Result Date: 12/14/2019 CLINICAL DATA:  Shortness of breath. EXAM: PORTABLE CHEST 1 VIEW COMPARISON:  Chest CT earlier today, additional prior exams. FINDINGS: Right chest tube not definitively visualized. Prominent subcutaneous emphysema throughout the right chest wall and supraclavicular soft tissues, partially obscuring evaluation for pneumothorax. Probable pleural line at the right lung apex, similar to prior and consistent with pneumothorax. Pneumomediastinum on CT visualized about the superior aspect. Small pleural effusions on CT not as well visualized. Unchanged heart size and mediastinal contours. Aortic atherosclerosis. IMPRESSION: 1. Right chest tube no longer seen. No other interval change from prior exam. 2. Small right apical pneumothorax partially obscured by subcutaneous emphysema but grossly stable. Pneumomediastinum with subcutaneous emphysema about the right chest wall and bilateral supraclavicular soft tissues. 3. Pleural effusions on CT earlier today not as well visualized. Stable cardiomegaly. Electronically Signed   By: Keith Rake M.D.   On: 12/14/2019 21:58        Scheduled Meds: . arformoterol  15 mcg Nebulization BID  . bisacodyl  10 mg Rectal Once  . budesonide (PULMICORT) nebulizer solution  0.5 mg Nebulization BID  . Chlorhexidine Gluconate Cloth  6 each  Topical Daily  . feeding supplement (ENSURE ENLIVE)  237 mL Oral BID BM  . insulin aspart  0-15 Units Subcutaneous Q4H  . ipratropium  0.5 mg Nebulization QID  . pantoprazole (PROTONIX) IV  40 mg Intravenous QHS  . polyethylene glycol  17 g Oral Daily  . senna-docusate  1 tablet Oral BID  . simvastatin  40 mg Oral q1800  . sodium chloride  flush  3 mL Intravenous Q12H  . sodium chloride flush  3 mL Intravenous Q12H   Continuous Infusions: . sodium chloride    . magnesium sulfate bolus IVPB    . phenylephrine (NEO-SYNEPHRINE) Adult infusion 20 mcg/min (01/12/20 1016)  . vancomycin       LOS: 2 days    Time spent: 35 mins,More than 50% of that time was spent in counseling and/or coordination of care.      Shelly Coss, MD [facility] P07-03-21, 10:29 AM

## 2020-01-10 NOTE — Progress Notes (Signed)
RN called [facility] to inform that chest x-ray results were in and it appeared to be worse than yesterdays. Rapid stated that he would take a look at it. Still have not heard from cardiothoracic surgeon that was paged.   Eleanora Neighbor, RN

## 2020-01-10 NOTE — Progress Notes (Signed)
Charge RN was unable to page cardiothoracic surgeon again because the option is no longer available. RN messaged NP on call [place] to make aware of patients condition. RN also called [facility] again to make aware of pt's current condition. Pt had an EKG this am and is in RVR which he has a history of. NP on call was paged to make aware. Awaiting response. On call RN made aware of patient's status and of attempts to get a hold of cardiothoracic surgeon and informing on call NP. All questions answered for on-coming RN during shift change and report at bedside.   Eleanora Neighbor, RN

## 2020-01-10 NOTE — Progress Notes (Signed)
Family at bedside. Patient delirious, wants BIPAP off. Bps dropping. Family does not want him to suffer further. Discussed with Dr. Prescott Gum, no good immediate options for management and if steadily deteriorating should be allowed to pass in peace.  Comfort care orders entered.  Erskine Emery MD PCCM

## 2020-01-10 NOTE — Consult Note (Signed)
NAME:  [name] Hardin, MRN:  [id number], DOB:  [birth year], LOS: 2 ADMISSION DATE:  12/18/2019, CONSULTATION DATE:  01-11-2020 REFERRING MD:  Tawanna Solo, CHIEF COMPLAINT:  SOB   Brief History   Tension pneumothorax, chest tube, hemothorax  History of present illness   86 year old man with hx of COPD, afib/PE, CHF, HTN presented to [facility] for SOB, found to have R tension PTX, underwent tube thoracostomy.  Complicated by persistent air leak, sent to [facility] as [facility] did not have thoracic coverage.  Here has been improving until overnight 6/5 when oxygen requirements increased, CXR showing worsening R effusion despite chest tube in place.  Bps dropping, HR increasing, PCCM consulted  Limited history as patient is in extremis.  Past Medical History  COPD Afib Prior PE On AC CHF- 45-50%  Significant Hospital Events   N/A  Consults:  TCTS  Procedures:  5/31: chest tube [facility]  Significant Diagnostic Tests:  CXR: worsening R aeration, pleural fluid  Micro Data:  N/A  Antimicrobials:  Vanc/cefepime started 6/6   Interim history/subjective:  Consulted  Objective   Blood pressure (!) 76/40, pulse (!) 109, temperature 97.7 F (36.5 C), resp. rate 16, height 5\' 11"  (1.803 m), weight 121 kg, SpO2 95 %.    FiO2 (%):  [44 %-60 %] 60 %   Intake/Output Summary (Last 24 hours) at 01-11-20 4098 Last data filed at 01/11/20 0500 Gross per 24 hour  Intake 930.39 ml  Output 3500 ml  Net -2569.61 ml   Filed Weights   12/15/19 1825  Weight: 121 kg    Examination: General: ill appearing obese man tripoding in bed HENT: MMM, trachea midline Lungs: diminished breath sounds R base, complex appearing pleural space on [place], large bruise surrounding R chest tube Cardiovascular: Irregular, tachycardic, ext warm Abdomen: Soft, hypoactive BS Extremities: no edema Neuro: Moves all  ext Psych: RASS +1, anxious, confused  Resolved Hospital Problem list     Assessment & Plan:  Acute hypoxemic  respiratory failure- due to worsening R lung aeration, history and exam suggestive of hemothorax.  Flushed tube, thick clots aspirated throughout tubing, unable to get it to tidal, suspect entire tube and pleural space full of clot.  Discussed with TCTS, no surgical options, supportive care, hold AC.  No benefit to additional chest tube. - Chest tube to -30cm H2O - BIPAP  Shock- early hemorrhagic likely - Type/screen, trend H/H - AC on hold, check PTT, consider protamine  Afib/RVR- not helping things but likely reactive - Amio bolus/gtt  CHF: appears to have been diuresed appropriately, hold antihypertensives  AKI on CKD: hold further diuretics  GoC: daughter at bedside, will address how far she wants to push this  Best practice:  Diet: NPO Pain/Anxiety/Delirium protocol (if indicated): PRNs VAP protocol (if indicated): N/A DVT prophylaxis: SCDs GI prophylaxis: PPI Glucose control: SSI Mobility: BR Code Status: DNR Family Communication: updated daughter at bedside, other children coming in Disposition: ICU for BIPAP and pressors  Labs   CBC: Recent Labs  Lab 12/10/19 0503 12/12/19 0428 12/14/19 0354 12/15/19 0354 01-11-20 0447  WBC 8.4 12.0* 10.0 10.0 19.8*  NEUTROABS 4.7  --   --   --   --   HGB 11.9* 11.0* 11.4* 11.2* 10.5*  HCT 37.0* 34.3* 35.7* 34.9* 33.1*  MCV 99.2 99.4 100.3* 100.0 100.6*  PLT 149* 147* 161 165 119    Basic Metabolic Panel: Recent Labs  Lab 12/11/19 0454 12/12/19 0428 12/14/19 0354 12/15/19 0354 2020-01-11 0447  NA 138 140 138 137 135  K 4.6 4.2 4.3 3.8 4.0  CL 98 101 101 99 99  CO2 29 29 26 27 26   GLUCOSE 120* 103* 108* 109* 136*  BUN 28* 35* 29* 33* 32*  CREATININE 1.33* 1.31* 1.36* 1.28* 1.55*  CALCIUM 8.9 8.8* 8.9 8.5* 8.2*  MG 2.4  --   --   --   --    GFR: Estimated Creatinine Clearance: 46.1 mL/min (A) (by C-G formula based on SCr of 1.55 mg/dL (H)). Recent Labs  Lab 12/12/19 0428 12/14/19 0354 12/15/19 0354  2019-12-31 0447  WBC 12.0* 10.0 10.0 19.8*    Liver Function Tests: Recent Labs  Lab 12/10/19 0503  AST 29  ALT 18  ALKPHOS 101  BILITOT 1.1  PROT 6.7  ALBUMIN 3.8   No results for input(s): LIPASE, AMYLASE in the last 168 hours. No results for input(s): AMMONIA in the last 168 hours.  ABG    Component Value Date/Time   HCO3 27.1 12/10/2019 0503   O2SAT 74.6 12/10/2019 0503     Coagulation Profile: Recent Labs  Lab 12/12/19 0428 12/14/2019 0600 12/14/19 0354 12/15/19 0354 12-31-2019 0447  INR 3.0* 2.1* 1.8* 1.7* 1.6*    Cardiac Enzymes: No results for input(s): CKTOTAL, CKMB, CKMBINDEX, TROPONINI in the last 168 hours.  HbA1C: Hgb A1c MFr Bld  Date/Time Value Ref Range Status  12/10/2019 05:05 AM 6.0 (H) 4.8 - 5.6 % Final    Comment:    (NOTE) Pre diabetes:          5.7%-6.4% Diabetes:              >6.4% Glycemic control for   <7.0% adults with diabetes     CBG: Recent Labs  Lab 01/04/2020 0800 01/05/2020 1146 12/23/2019 1659 12/14/19 1213 12/14/19 1613  GLUCAP 94 113* 126* 119* 125*    Review of Systems:   Cannot assess due to patient condition  Past Medical History  He,  has a past medical history of ATRIAL FIBRILLATION (03/05/2010), Benign hypertension (12/16/2013), Cancer ([place]), Chronic obstructive pulmonary disease ([place]) (12/16/2013), COPD (chronic obstructive pulmonary disease) ([place]), Family history of adverse reaction to anesthesia, pulmonary embolism (08/2000), Hypercholesterolemia, Hyperglycemia, Hypertension, Osteoarthritis, Pre-diabetes, PREMATURE VENTRICULAR CONTRACTIONS (03/05/2010), Pulmonary embolism ([place]) (12/16/2013), and Stroke ([place]) (1997).   Surgical History    Past Surgical History:  Procedure Laterality Date  . CYSTOSCOPY W/ RETROGRADES Bilateral 05/18/2017   Procedure: CYSTOSCOPY WITH RETROGRADE PYELOGRAM;  Surgeon: Hollice Espy, MD;  Location: [facility] ORS;  Service: Urology;  Laterality: Bilateral;  . CYSTOSCOPY WITH BIOPSY N/A 05/18/2017    Procedure: CYSTOSCOPY WITH BIOPSY;  Surgeon: Hollice Espy, MD;  Location: [facility] ORS;  Service: Urology;  Laterality: N/A;  . JOINT REPLACEMENT Right   . KYPHOPLASTY N/A 10/25/2019   Procedure: L2 KYPHOPLASTY;  Surgeon: Hessie Knows, MD;  Location: [facility] ORS;  Service: Orthopedics;  Laterality: N/A;  . PERIPHERAL VASCULAR CATHETERIZATION N/A 02/04/2015   Procedure: IVC Filter Removal;  Surgeon: Katha Cabal, MD;  Location: [facility];  Service: Cardiovascular;  Laterality: N/A;  . TONSILLECTOMY    . URETEROSCOPY Left 05/18/2017   Procedure: URETEROSCOPY;  Surgeon: Hollice Espy, MD;  Location: [facility] ORS;  Service: Urology;  Laterality: Left;     Social History   reports that he quit smoking about 28 years ago. He has never used smokeless tobacco. He reports that he does not drink alcohol or use drugs.   Family History   His family history is  negative for Prostate cancer and Kidney cancer.   Allergies No Known Allergies   Home Medications  Prior to Admission medications   Medication Sig Start Date End Date Taking? Authorizing Provider  acetaminophen (TYLENOL) 500 MG tablet Take 1,000 mg by mouth daily as needed.    [provider]  allopurinol (ZYLOPRIM) 300 MG tablet Take 300 mg by mouth daily. In am    [provider]  aspirin EC 81 MG tablet Take 81 mg by mouth daily.    [provider]  colchicine (COLCRYS) 0.6 MG tablet Take 0.6 mg by mouth every other day.     [provider]  diltiazem (CARDIZEM CD) 240 MG 24 hr capsule Take 240 mg by mouth daily. In am    [provider]  Fluticasone-Salmeterol (ADVAIR DISKUS) 250-50 MCG/DOSE AEPB Inhale 1 puff into the lungs 2 (two) times daily.      [provider]  Multiple Vitamin (MULTIVITAMIN WITH MINERALS) TABS tablet Take 1 tablet by mouth daily.    [provider]  polyethylene glycol (MIRALAX / GLYCOLAX) 17 g packet Take 17 g by mouth daily. 12/24/2019   Fritzi Mandes, MD  potassium chloride (KLOR-CON) 10 MEQ CR tablet Take 10 mEq by mouth daily. In am    [provider]  simvastatin (ZOCOR) 40 MG tablet Take 40 mg by mouth daily at 6 PM.     [provider]  tiotropium (SPIRIVA HANDIHALER) 18 MCG inhalation capsule Place 18 mcg into inhaler and inhale daily. In am    [provider]  torsemide (DEMADEX) 100 MG tablet Take 100 mg by mouth daily. 11/26/19   [provider]  warfarin (COUMADIN) 5 MG tablet Take 5 mg by mouth daily.    [provider]     Critical care time: 45 minutes not including any separately billable  procedures

## 2020-01-10 NOTE — Progress Notes (Signed)
Pt was very SOB when NT went in to get vitals around 0300. Pt's oxygen nasal cannula was only in one nostril due to it constantly moving. Pt was visibly SOB and restless in the bed. Oxygen was sating in the 80s and pulse was up to 150s. Charge RN, Amie called Rapid Response nurse, Waunita Schooner to the bedside. Rapid Response changed the dressing and worked with the tubing to break up what appeared to be clots in the upper part of the chest tube. Oxygen had been increased to get sats in the 90s. Rapid informed this RN to slowly decrease oxygen down a liter about every 30 minutes to allow the patient time to not feel SOB. Pt states that he can breathe a lot easier than before Rapid came. Pt is resting in the bed with the head of the bed up like he is sitting in a chair. Rapid also checked chest tube canister and everything appears to be working properly. RN will continue to monitor.

## 2020-01-10 NOTE — Progress Notes (Signed)
Pt passed peacefully with family at bedside. Time of death pronounced at 12:36pm by myself and Marshell Levan RN

## 2020-01-10 NOTE — Progress Notes (Signed)
Pt still c/o SOB. Vitals are WNL at this time. RN paged on call cardiothoracic surgeon for weekends and holidays at 0546 to make aware. Awaiting call back to inform of patients status. RN also went ahead and called x-ray so they can go ahead and do chest x-ray at this time. RN also called Waunita Schooner with Rapid to make aware of pt's status and what interventions have been made at this time. Will continue to monitor pt.   Eleanora Neighbor, RN

## 2020-01-10 NOTE — Progress Notes (Signed)
CDS referral initiated. Referral number is [id number].

## 2020-01-10 NOTE — Progress Notes (Signed)
Pharmacy Antibiotic Note  [name] Hardin is a 86 y.o. male admitted on 01/08/2020 with pneumonia. Patient had rapid response event this morning with desaturations to 83%, hypotension, and worsening appearance of lungs on chest xray. WBC is trending down, but remains elevated at 18.7. Renal function is worse with serum creatinine increasing from 1.28 to 1.55. Patient is afebrile. Pharmacy has been consulted for vancomycin and cefepime dosing for suspected PNA.   Plan: - Vancomycin loading dose 2g IV x1 - Vancomycin 1g IV q24hr thereafter, estimated AUC 469 using Scr 1.55. Goal AUC 400-550. May need to dose based on troughs if level reagent unavailable.  - Cefepime 2g IV q12hr - Follow cultures/sensitivities, MRSA PCR  Height: 5\' 11"  (180.3 cm) Weight: 121 kg (266 lb 12.8 oz) IBW/kg (Calculated) : 75.3  Temp (24hrs), Avg:98.3 F (36.8 C), Min:97.7 F (36.5 C), Max:98.9 F (37.2 C)  Recent Labs  Lab 12/10/19 0503 12/10/19 0503 12/11/19 0454 12/12/19 0428 12/14/19 0354 12/15/19 0354 2020/01/06 0447  WBC 8.4  --   --  12.0* 10.0 10.0 19.8*  CREATININE 1.34*   < > 1.33* 1.31* 1.36* 1.28* 1.55*   < > = values in this interval not displayed.    Estimated Creatinine Clearance: 46.1 mL/min (A) (by C-G formula based on SCr of 1.55 mg/dL (H)).    No Known Allergies  Antimicrobials this admission: Vancomycin 6/6 >>  Cefepime 6/6 >>   Microbiology results: None ordered at this time  Thank you for allowing pharmacy to be a part of this patient's care.  Agnes Lawrence, PharmD PGY1 Pharmacy Resident

## 2020-01-10 NOTE — Progress Notes (Signed)
TCTS DAILY ICU PROGRESS NOTE                   [address]            [place] [address]          [phone]        Total Length of Stay:  LOS: 2 days   Subjective: Events of morning noted.  Objective: Vital signs in last 24 hours: Temp:  [97.7 F (36.5 C)-98.9 F (37.2 C)] 97.7 F (36.5 C) (06/06 0724) Pulse Rate:  [68-132] 132 (06/06 0835) Cardiac Rhythm: Atrial fibrillation (06/06 0700) Resp:  [14-20] 14 (06/06 0916) BP: (72-108)/(37-66) 76/40 (06/06 0724) SpO2:  [90 %-100 %] 100 % (06/06 0916) FiO2 (%):  [60 %] 60 % (06/06 0741) Weight:  [121 kg] 121 kg (06/05 1825)  Filed Weights   12/15/19 1825  Weight: 121 kg      Intake/Output from previous day: 06/05 0701 - 06/06 0700 In: 930.4 [P.O.:180; I.V.:500.4; IV Piggyback:250] Out: 4000 [Urine:4000]  Intake/Output this shift: No intake/output data recorded.  Current Meds: Scheduled Meds:  arformoterol  15 mcg Nebulization BID   bisacodyl  10 mg Rectal Once   budesonide (PULMICORT) nebulizer solution  0.5 mg Nebulization BID   Chlorhexidine Gluconate Cloth  6 each Topical Daily   feeding supplement (ENSURE ENLIVE)  237 mL Oral BID BM   insulin aspart  0-15 Units Subcutaneous Q4H   ipratropium  0.5 mg Nebulization QID   pantoprazole (PROTONIX) IV  40 mg Intravenous QHS   polyethylene glycol  17 g Oral Daily   senna-docusate  1 tablet Oral BID   simvastatin  40 mg Oral q1800   sodium chloride flush  3 mL Intravenous Q12H   sodium chloride flush  3 mL Intravenous Q12H   Continuous Infusions:  sodium chloride     ceFEPime (MAXIPIME) IV     magnesium sulfate bolus IVPB     phenylephrine (NEO-SYNEPHRINE) Adult infusion     [START ON 12/17/2019] vancomycin     vancomycin     PRN Meds:.sodium chloride, acetaminophen **OR** acetaminophen, HYDROcodone-acetaminophen, morphine injection, Muscle Rub, ondansetron **OR** ondansetron (ZOFRAN) IV, sodium chloride flush  General appearance: severe  distress Heart: irregularly irregular rhythm Lungs: Greatly diminished on left  Lab Results: CBC: Recent Labs    12/15/19 0354 2019-12-31 0447  WBC 10.0 19.8*  HGB 11.2* 10.5*  HCT 34.9* 33.1*  PLT 165 208   BMET:  Recent Labs    12/15/19 0354 Dec 31, 2019 0447  NA 137 135  K 3.8 4.0  CL 99 99  CO2 27 26  GLUCOSE 109* 136*  BUN 33* 32*  CREATININE 1.28* 1.55*  CALCIUM 8.5* 8.2*    CMET: Lab Results  Component Value Date   WBC 19.8 (H) December 31, 2019   HGB 10.5 (L) 12-31-19   HCT 33.1 (L) December 31, 2019   PLT 208 December 31, 2019   GLUCOSE 136 (H) 2019/12/31   ALT 18 12/10/2019   AST 29 12/10/2019   NA 135 2019-12-31   K 4.0 12-31-19   CL 99 12-31-19   CREATININE 1.55 (H) 12/31/2019   BUN 32 (H) 12/31/2019   CO2 26 2019/12/31   INR 1.6 (H) 12/31/2019   HGBA1C 6.0 (H) 12/10/2019      PT/INR:  Recent Labs    December 31, 2019 0447  LABPROT 18.7  INR 1.6   Radiology: DG CHEST PORT 1 VIEW  Result Date: 12/31/2019 CLINICAL DATA:  Follow-up pneumothorax. EXAM: PORTABLE  CHEST 1 VIEW COMPARISON:  12/15/2019 and older exams. FINDINGS: Tiny pneumothorax suggested on the previous day's exam is not visualized. There is no current evidence of a pneumothorax, exam limited on the right due to overlying subcutaneous emphysema. There is worsened lung aeration with increased opacity in the right lower lung, now obscuring the hemidiaphragm. This is consistent with a combination of pleural fluid and either atelectasis or infection. Mild opacity at the left lung base is stable, likely a combination of atelectasis and a small effusion. Remainder of the left lung is clear. Extensive, predominantly right-sided, subcutaneous emphysema is unchanged from the previous day's study. IMPRESSION: 1. Worsened lung aeration on the right compared to the previous day's exam. Increased opacity has developed at the lung base obscuring the hemidiaphragm. This is likely a combination of pleural fluid with either  atelectasis or pneumonia. 2. No convincing pneumothorax and no other change from the previous day's exam. Electronically Signed   By: Lajean Manes M.D.   On: 2019/12/31 06:58    Assessment/Plan: 1. CV - A fib with HR 120's. He was on Cardizem and IV Heparin, both of which stopped. 2.  Pulmonary - Hypoxia worsened last evening into this am. On bi pap. Chest tube is to suction, no air leak.  CXR this am shows increased opacification RLL (blood?, ? PNA and no pneumothorax).  Check CXR in am. Appreciate pulmonary/CCM assistance. Will discuss if anything able to do surgically with Dr. Prescott Gum 3. Anemia-H and H this am 10.5 and 33.1 4. ID-put on Vanco and Cefepime for possible PNA. Leukocytosis-WBC up to 19,800. Recheck in am 5. Creatinine increased to 1.55  Kimesha Claxton Liston Alba PA-C 31-Dec-2019 9:26 AM

## 2020-01-10 NOTE — Progress Notes (Signed)
BP is 78/58 manually. RN messaged NP on call to make aware. Also informed of previous call to Rapid regarding SOB. Awaiting response.   Eleanora Neighbor, RN

## 2020-01-10 NOTE — Death Summary Note (Signed)
Death Summary  [name] Hardin KYH:[id number] DOB: [birth year] DOA: 23-Dec-2019  PCP: [name], MD  Admit date: 12-23-19 Date of Death: 12/27/19 Time of Death: 12:36 pm Notification: [name], MD notified of death of 27-Dec-2019   History of present illness:  Patient is 86 year old male with history of COPD, proximal A. fib, PE on Coumadin, chronic systolic congestive heart failure, hypertension who presented to the emergency department at [place] on 12/10/2019 with acute onset of shortness of breath and chest discomfort. On presentation he was saturating in the 80s on room air. He was initially treated for COPD exacerbation. He was also in A. fib with RVR. Imaging showed right-sided pneumothorax with concern for tension component and chest tube was placed. Respiratory status improved significantly after chest tube placement, oxygen weaned down to 2 L/min. Repeat imaging demonstrated slight increase in pneumothorax, subcutaneous emphysema so he was transferred to [name] for cardiothoracic surgery evaluation.  He was started on conservative management, chest tube is to suction. Cardiothoracic surgery was following and he was being managed conservatively.  He progressively became hypoxic. On the morning of 12-26-19 ,rapid response was called because he became hypotensive and he desaturated requiring 15 L of high flow oxygen.  PCCM consulted urgently.  He was found to have a lot of clots in the chest tube.   Chest x-ray  showed worsening aeration on the right lung with possible hemothorax.  Patient continued to decline.  He was put on BiPAP and transferred to ICU.  Family did not want him to suffer and they chose to put him on comfort care.  Patient passed away at 12: 36 PM.   Final Diagnoses:  1.   Right-sided pneumothorax  The results of significant diagnostics from this hospitalization (including imaging, microbiology, ancillary and laboratory) are listed below  for reference.    Significant Diagnostic Studies: DG Chest 1 View  Result Date: 12/23/19 CLINICAL DATA:  86 year old male with right pneumothorax. EXAM: CHEST  1 VIEW COMPARISON:  Radiograph dated 12/12/2019 and CT dated 23-Dec-2019 FINDINGS: Evaluation is very limited due to extensive soft tissue emphysema of the right chest wall. A right upper lobe pneumothorax again noted measuring approximately 15 mm in thickness to the pleural surface relatively similar to prior radiograph. Extensive right chest wall soft tissue emphysema extending to the neck. No other interval change. IMPRESSION: No significant interval change in the appearance of the right upper lobe pneumothorax compared to the prior radiograph. Continued follow-up recommended. Electronically Signed   By: Anner Crete M.D.   On: 23-Dec-2019 20:11   CT Chest Wo Contrast  Result Date: 12/14/2019 CLINICAL DATA:  Dislodged chest tube EXAM: CT CHEST WITHOUT CONTRAST TECHNIQUE: Multidetector CT imaging of the chest was performed following the standard protocol without IV contrast. COMPARISON:  12-23-2019 4:26 p.m. FINDINGS: Cardiovascular: There is moderate cardiomegaly and a small pericardial effusion. Coronary artery calcifications are seen. Aortic valve and scattered aortic atherosclerosis is noted. Mediastinum/Nodes: Scattered small mediastinal lymph nodes are again noted. The thyroid gland, trachea and esophagus demonstrate no significant findings. Lungs/Pleura: Again seen is a right-sided chest tube located on the posterior superior pleura and lung apex. There appears to be in debris within the pleural chest tube. A small anterior superior right pneumothorax is again noted, not significantly changed since the prior exam. Small bilateral pleural effusions are seen, left greater than right. Layering calcified gallstones are present. Multiple low-density lesions seen throughout the liver. There is mild misty mesentery  within the mid abdomen.  Musculoskeletal/Chest wall: Again noted is subcutaneous emphysema along the right lateral and anterior chest wall with small overlying hematoma. No acute fracture is noted. IMPRESSION: Right-sided chest tube in unchanged position along the posterosuperior pleural space with a small amount of internal debris. Unchanged small anterior superior right pneumothorax. Extensive unchanged subcutaneous emphysema along the anterolateral chest walls. Small bilateral pleural effusions, left greater than right. Electronically Signed   By: Prudencio Pair M.D.   On: 12/14/2019 01:44   CT CHEST WO CONTRAST  Result Date: 12/18/2019 CLINICAL DATA:  Spontaneous pneumothorax on right and status post lateral large caliber chest tube placement on 12/10/2019. CT yesterday demonstrated a persistent anterior pneumothorax and extension of the tip of the indwelling chest tube just into the posterior mediastinum. Plan for second smaller caliber chest tube placement to treat the residual component of pneumothorax. EXAM: CT CHEST WITHOUT CONTRAST TECHNIQUE: Multidetector CT imaging of the chest was performed following the standard protocol without IV contrast. COMPARISON:  12/12/2019 FINDINGS: Cardiovascular: Stable cardiac enlargement, trace pericardial fluid and calcified coronary artery plaque. Mediastinum/Nodes: Stable scattered mediastinal lymph nodes, some of which are borderline/mildly enlarged. The largest is in the AP window measuring approximately 13 mm in short axis. Overall volume of pneumomediastinum has decreased slightly since the prior CT yesterday. Lungs/Pleura: The tip of the indwelling right-sided chest tube has now been retracted slightly out of the mediastinum and is now located in the superior and medial aspect of the pleural space near the right apex. There is resultant significant decrease in the residual pneumothorax with only a small anterior pneumothorax remaining which is currently too small to accommodate a chest  tube. Stable small left pleural effusion and trace right pleural effusion. Stable lateral blebs adjacent to the right upper lobe and small anterior blebs of the left lung. Musculoskeletal/Soft Tissues: Overall amount of subcutaneous emphysema in the right chest wall, right axilla and extending into the right neck shows slight enlargement since the prior CT yesterday. IMPRESSION: 1. The tip of the indwelling right-sided chest tube has now been retracted slightly out of the mediastinum and is now located in the superior and medial aspect of the pleural space near the right apex. There is resultant significant decrease in the residual right pneumothorax with only a small anterior pneumothorax remaining which is currently too small to accommodate a chest tube. 2. Overall amount of subcutaneous emphysema in the right chest wall, right axilla and extending into the right neck shows slight enlargement since the prior CT yesterday. 3. Volume of pneumomediastinum shows slight decreased since the prior CT yesterday. 4. Stable small left pleural effusion and trace right pleural effusion. 5. Stable cardiac enlargement, trace pericardial fluid and calcified coronary artery plaque. 6. Stable borderline/mildly enlarged mediastinal lymph nodes. Electronically Signed   By: Aletta Edouard M.D.   On: 12/20/2019 17:06   CT CHEST W CONTRAST  Result Date: 12/12/2019 CLINICAL DATA:  86 year old male with history of right-sided pneumothorax. EXAM: CT CHEST WITH CONTRAST TECHNIQUE: Multidetector CT imaging of the chest was performed during intravenous contrast administration. CONTRAST:  2mL OMNIPAQUE IOHEXOL 300 MG/ML  SOLN COMPARISON:  Chest CTA 07/25/2017. FINDINGS: Cardiovascular: Heart size is enlarged with biatrial dilatation. There is no significant pericardial fluid, thickening or pericardial calcification. There is aortic atherosclerosis, as well as atherosclerosis of the great vessels of the mediastinum and the coronary  arteries, including calcified atherosclerotic plaque in the left main, left anterior descending, left circumflex and right coronary arteries. Calcifications of  the aortic valve and mitral annulus. Mediastinum/Nodes: Pneumomediastinum. Multiple prominent borderline enlarged mediastinal and hilar lymph nodes are incidentally noted. Esophagus is unremarkable in appearance. No axillary lymphadenopathy. Lungs/Pleura: Moderate-sized right-sided pneumothorax with right-sided chest tube in position directed into the medial aspect of the right hemithorax. However, the tip of the tube (visualized on axial image 32 of series 3 and coronal image 94 of series 5) appears to violate the pleura and extend into the middle mediastinum immediately adjacent to the trachea. In this region, there is a focal distortion of the tracheal wall best appreciated on axial image 33 of series 3 and the adjacent images, which could suggest a focal area of tracheal trauma. Trace right and small left pleural effusions lying dependently. No acute consolidative airspace disease. No definite suspicious appearing pulmonary nodules or masses are noted. Upper Abdomen: Aortic atherosclerosis. Small calcified gallstones lying dependently in the gallbladder. Liver has a shrunken appearance and nodular contour, suggestive of underlying cirrhosis. Musculoskeletal: Large amount of subcutaneous emphysema and intramuscular gas in the right chest wall tracking cephalad into the right cervical region. There are no aggressive appearing lytic or blastic lesions noted in the visualized portions of the skeleton. IMPRESSION: 1. Right-sided chest tube noted, with tip of chest tube potentially in the middle mediastinum adjacent to the mid trachea. Possible direct traumatic injury to the trachea noted (although these findings may simply be related to retained secretions in the trachea). Large amount of pneumomediastinum. Moderate volume of residual right pneumothorax.  Extensive subcutaneous emphysema, as above. 2. Cardiomegaly with biatrial dilatation. 3. Aortic atherosclerosis, in addition to left main and 3 vessel coronary artery disease. 4. There are calcifications of the aortic valve and mitral annulus. Echocardiographic correlation for evaluation of potential valvular dysfunction may be warranted if clinically indicated. 5. Cholelithiasis. 6. Morphologic changes in the liver suggestive of cirrhosis. Aortic Atherosclerosis (ICD10-I70.0). These results will be called to the ordering clinician or representative by the Radiologist Assistant, and communication documented in the PACS or [employer]. Electronically Signed   By: Vinnie Langton M.D.   On: 12/12/2019 20:33   DG CHEST PORT 1 VIEW  Result Date: 01-15-20 CLINICAL DATA:  Follow-up pneumothorax. EXAM: PORTABLE CHEST 1 VIEW COMPARISON:  12/15/2019 and older exams. FINDINGS: Tiny pneumothorax suggested on the previous day's exam is not visualized. There is no current evidence of a pneumothorax, exam limited on the right due to overlying subcutaneous emphysema. There is worsened lung aeration with increased opacity in the right lower lung, now obscuring the hemidiaphragm. This is consistent with a combination of pleural fluid and either atelectasis or infection. Mild opacity at the left lung base is stable, likely a combination of atelectasis and a small effusion. Remainder of the left lung is clear. Extensive, predominantly right-sided, subcutaneous emphysema is unchanged from the previous day's study. IMPRESSION: 1. Worsened lung aeration on the right compared to the previous day's exam. Increased opacity has developed at the lung base obscuring the hemidiaphragm. This is likely a combination of pleural fluid with either atelectasis or pneumonia. 2. No convincing pneumothorax and no other change from the previous day's exam. Electronically Signed   By: Lajean Manes M.D.   On: 2020/01/15 06:58   DG Chest Port  1 View  Result Date: 12/15/2019 CLINICAL DATA:  Chest tube. EXAM: PORTABLE CHEST 1 VIEW COMPARISON:  Chest radiograph December 14, 2019. FINDINGS: Monitoring leads overlie the patient. Stable cardiomegaly. Similar pneumomediastinum. Bibasilar opacities favored to represent atelectasis. Extensive subcutaneous emphysema along the right lateral  chest wall and lower neck. This limits evaluation however there is a possible persistent trace right apical pneumothorax. IMPRESSION: Evaluation limited due to overlapping subcutaneous emphysema. Possible tiny left apical pneumothorax. Electronically Signed   By: Lovey Newcomer M.D.   On: 12/15/2019 13:37   DG CHEST PORT 1 VIEW  Result Date: 12/14/2019 CLINICAL DATA:  Shortness of breath. EXAM: PORTABLE CHEST 1 VIEW COMPARISON:  Chest CT earlier today, additional prior exams. FINDINGS: Right chest tube not definitively visualized. Prominent subcutaneous emphysema throughout the right chest wall and supraclavicular soft tissues, partially obscuring evaluation for pneumothorax. Probable pleural line at the right lung apex, similar to prior and consistent with pneumothorax. Pneumomediastinum on CT visualized about the superior aspect. Small pleural effusions on CT not as well visualized. Unchanged heart size and mediastinal contours. Aortic atherosclerosis. IMPRESSION: 1. Right chest tube no longer seen. No other interval change from prior exam. 2. Small right apical pneumothorax partially obscured by subcutaneous emphysema but grossly stable. Pneumomediastinum with subcutaneous emphysema about the right chest wall and bilateral supraclavicular soft tissues. 3. Pleural effusions on CT earlier today not as well visualized. Stable cardiomegaly. Electronically Signed   By: Keith Rake M.D.   On: 12/14/2019 21:58   DG Chest Portable 1 View  Result Date: 12/26/2019 CLINICAL DATA:  86 year old male with right pneumothorax. EXAM: PORTABLE CHEST 1 VIEW COMPARISON:  Earlier radiograph  dated 12/18/2019. FINDINGS: Apparent pleural reflection along the right upper lobe concerning for a small pneumothorax. No significant interval change since the prior radiograph. Evaluation is limited due to overlying soft tissue emphysema. No other interval change. IMPRESSION: No significant interval change since the prior radiograph. Electronically Signed   By: Anner Crete M.D.   On: 01/03/2020 23:22   DG CHEST PORT 1 VIEW  Result Date: 12/12/2019 CLINICAL DATA:  86 year old male with pneumothorax EXAM: PORTABLE CHEST 1 VIEW COMPARISON:  Prior chest x-ray 12/11/2019 FINDINGS: Stable cardiac and mediastinal contours. Atherosclerotic calcifications again noted in the transverse aorta. Perhaps slight interval progression of trace right apical pneumothorax. The pneumothorax is approximately 5%. Slightly progressive diffuse subcutaneous emphysema within the soft tissues of the right neck and chest wall. Chronic background bronchitic changes and mild bibasilar atelectasis. IMPRESSION: 1. Slight increase in small right apical pneumothorax which remains less than 10%). 2. Increasing subcutaneous emphysema. Electronically Signed   By: Jacqulynn Cadet M.D.   On: 12/12/2019 08:45   DG CHEST PORT 1 VIEW  Result Date: 12/11/2019 CLINICAL DATA:  Pneumothorax EXAM: PORTABLE CHEST 1 VIEW COMPARISON:  12/10/2019 FINDINGS: The heart size and mediastinal contours are unchanged with cardiomegaly. Aortic knob calcifications. There is prominence of the central pulmonary vasculature. No right-sided pneumothorax is seen. Overlying subcutaneous emphysema is noted. IMPRESSION: Mild pulmonary vascular congestion.  No residual pneumothorax. Electronically Signed   By: Prudencio Pair M.D.   On: 12/11/2019 05:39   DG Chest Portable 1 View  Result Date: 12/10/2019 CLINICAL DATA:  Follow-up chest tube. EXAM: PORTABLE CHEST 1 VIEW COMPARISON:  Chest radiograph from earlier today. FINDINGS: Right apical chest tube terminates in the  medial right apical pleural space. Stable cardiomediastinal silhouette with mild cardiomegaly. No pneumothorax. No pleural effusion. Stable coarsened interstitial opacities in both lungs with mild left basilar atelectasis. IMPRESSION: 1. No pneumothorax. Right apical chest tube in place. 2. Stable mild cardiomegaly. 3. Stable coarsened interstitial opacities in both lungs with mild left basilar atelectasis. Electronically Signed   By: Ilona Sorrel M.D.   On: 12/10/2019 07:13   DG Chest  Portable 1 View  Result Date: 12/10/2019 CLINICAL DATA:  Pneumothorax, chest tube placement EXAM: PORTABLE CHEST 1 VIEW COMPARISON:  Chest radiograph from earlier today. FINDINGS: Right chest tube terminates in apparent extrapleural location along the lateral margin of right fourth rib with surrounding mild lateral right chest wall subcutaneous emphysema. Stable cardiomediastinal silhouette with mild cardiomegaly. No appreciable residual pneumothorax. No pleural effusion. Diffuse interstitial opacities with resolved right lung atelectasis. IMPRESSION: 1. No appreciable residual pneumothorax. Right chest tube terminates in apparent extrapleural location along the lateral margin of the right fourth rib with surrounding mild lateral right chest wall subcutaneous emphysema. 2. Stable mild cardiomegaly with diffuse interstitial opacities, suggesting mild pulmonary edema. These results were called by telephone at the time of interpretation on 12/10/2019 at 6:22 am to provider [facility] , who verbally acknowledged these results. Electronically Signed   By: Ilona Sorrel M.D.   On: 12/10/2019 06:23   DG Chest Portable 1 View  Result Date: 12/10/2019 CLINICAL DATA:  Dyspnea EXAM: PORTABLE CHEST 1 VIEW COMPARISON:  11/30/2011 chest CT FINDINGS: Mild cardiomegaly. Large right pneumothorax, approximately 60-70 %. No left pneumothorax. Slight left mediastinal shift. Complete right lung atelectasis. Mild interstitial prominence  throughout the left lung. IMPRESSION: 1. Large right pneumothorax with slight left mediastinal shift, suggesting tension pneumothorax. Complete right lung atelectasis. 2. Mild cardiomegaly. Mild interstitial prominence throughout the left lung, cannot exclude mild pulmonary edema. Critical Value/emergent results were called by telephone at the time of interpretation on 12/10/2019 at 5:13 am to provider DR. Nancy Fetter, who verbally acknowledged these results. Electronically Signed   By: Ilona Sorrel M.D.   On: 12/10/2019 05:17   ECHOCARDIOGRAM COMPLETE  Result Date: 12/11/2019    ECHOCARDIOGRAM REPORT   Patient Name:   CHRISTO HAIN [facility] Date of Exam: 12/10/2019 Medical Rec #:  [id number]        Height:       71.0 in Accession #:    [id number]       Weight:       272.0 lb Date of Birth:  1932/04/20        BSA:          2.403 m Patient Age:    54 years         BP:           113/58 mmHg Patient Gender: M                HR:           88 bpm. Exam Location:  [facility] Procedure: 2D Echo, Cardiac Doppler and Color Doppler Indications:     Congestive Heart Failure 428.0 / I50.9  History:         Patient has no prior history of Echocardiogram examinations.                  Stroke, Arrythmias:Atrial Fibrillation; Risk                  Factors:Hypertension.  Sonographer:     Alyse Low Roar Referring Phys:  [id number] AGBATA Diagnosing Phys: Nelva Bush MD IMPRESSIONS  1. Left ventricular ejection fraction, by estimation, is 45 to 50%. The left ventricle has mildly decreased function. Left ventricular endocardial border not optimally defined to evaluate regional wall motion. There is mild left ventricular hypertrophy.  Left ventricular diastolic parameters are indeterminate.  2. Right ventricular systolic function is normal. The right ventricular size is moderately enlarged. There is moderately elevated pulmonary artery systolic pressure.  3. Left atrial size was severely dilated.  4. Right atrial size was moderately dilated.  5. The  mitral valve is degenerative. Moderate mitral valve regurgitation. No evidence of mitral stenosis.  6. Tricuspid valve regurgitation is moderate to severe.  7. The aortic valve has an indeterminant number of cusps. Aortic valve regurgitation is not visualized. Mild aortic valve sclerosis is present, with no evidence of aortic valve stenosis. FINDINGS  Left Ventricle: Left ventricular ejection fraction, by estimation, is 45 to 50%. The left ventricle has mildly decreased function. Left ventricular endocardial border not optimally defined to evaluate regional wall motion. The left ventricular internal cavity size was normal in size. There is mild left ventricular hypertrophy. Left ventricular diastolic parameters are indeterminate. Right Ventricle: The right ventricular size is moderately enlarged. Right vetricular wall thickness was not assessed. Right ventricular systolic function is normal. There is moderately elevated pulmonary artery systolic pressure. The tricuspid regurgitant velocity is 2.96 m/s, and with an assumed right atrial pressure of 10 mmHg, the estimated right ventricular systolic pressure is 08.1 mmHg. Left Atrium: Left atrial size was severely dilated. Right Atrium: Right atrial size was moderately dilated. Pericardium: A small pericardial effusion is present. Mitral Valve: The mitral valve is degenerative in appearance. There is mild thickening of the mitral valve leaflet(s). Mild mitral annular calcification. Moderate mitral valve regurgitation, with eccentric posteriorly directed jet. No evidence of mitral valve stenosis. Tricuspid Valve: The tricuspid valve is normal in structure. Tricuspid valve regurgitation is moderate to severe. Aortic Valve: The aortic valve has an indeterminant number of cusps. . There is mild thickening and mild calcification of the aortic valve. Aortic valve regurgitation is not visualized. Mild aortic valve sclerosis is present, with no evidence of aortic valve  stenosis. There is mild thickening of the aortic valve. There is mild calcification of the aortic valve. Aortic valve mean gradient measures 4.0 mmHg. Aortic valve peak gradient measures 8.4 mmHg. Aortic valve area, by VTI measures 1.42 cm. Pulmonic Valve: The pulmonic valve was not well visualized. Pulmonic valve regurgitation is trivial. No evidence of pulmonic stenosis. Aorta: The aortic root is normal in size and structure. Pulmonary Artery: The pulmonary artery is of normal size. IAS/Shunts: The interatrial septum was not well visualized.  LEFT VENTRICLE PLAX 2D LVIDd:         5.46 cm  Diastology LVIDs:         4.07 cm  LV e' lateral:   14.90 cm/s LV PW:         1.18 cm  LV E/e' lateral: 8.3 LV IVS:        1.23 cm  LV e' medial:    8.70 cm/s LVOT diam:     1.70 cm  LV E/e' medial:  14.3 LV SV:         31 LV SV Index:   13 LVOT Area:     2.27 cm  RIGHT VENTRICLE RV Mid diam:    4.86 cm RV S prime:     20.00 cm/s TAPSE (M-mode): 2.0 cm LEFT ATRIUM              Index       RIGHT ATRIUM           Index LA diam:        5.20 cm  2.16 cm/m  RA Area:     26.40 cm LA Vol (A2C):   146.0 ml 60.76 ml/m RA Volume:   87.30 ml  36.33 ml/m LA Vol (  A4C):   169.0 ml 70.33 ml/m LA Biplane Vol: 163.0 ml 67.83 ml/m  AORTIC VALVE                   PULMONIC VALVE AV Area (Vmax):    1.29 cm    PV Vmax:        0.90 m/s AV Area (Vmean):   1.27 cm    PV Peak grad:   3.2 mmHg AV Area (VTI):     1.42 cm    RVOT Peak grad: 2 mmHg AV Vmax:           145.00 cm/s AV Vmean:          95.300 cm/s AV VTI:            0.216 m AV Peak Grad:      8.4 mmHg AV Mean Grad:      4.0 mmHg LVOT Vmax:         82.50 cm/s LVOT Vmean:        53.400 cm/s LVOT VTI:          0.135 m LVOT/AV VTI ratio: 0.62  AORTA Ao Root diam: 3.10 cm MITRAL VALVE                TRICUSPID VALVE MV Area (PHT): 4.86 cm     TR Peak grad:   35.0 mmHg MV Decel Time: 156 msec     TR Vmax:        296.00 cm/s MV E velocity: 124.00 cm/s                             SHUNTS                              Systemic VTI:  0.14 m                             Systemic Diam: 1.70 cm Nelva Bush MD Electronically signed by Nelva Bush MD Signature Date/Time: 12/11/2019/12:39:22 PM    Final     Microbiology: Recent Results (from the past 240 hour(s))  SARS Coronavirus 2 by RT PCR (hospital order, performed in [place] hospital lab) Nasopharyngeal Nasopharyngeal Swab     Status: None   Collection Time: 12/10/19  5:05 AM   Specimen: Nasopharyngeal Swab  Result Value Ref Range Status   SARS Coronavirus 2 NEGATIVE NEGATIVE Final    Comment: (NOTE) SARS-CoV-2 target nucleic acids are NOT DETECTED. The SARS-CoV-2 RNA is generally detectable in upper and lower respiratory specimens during the acute phase of infection. The lowest concentration of SARS-CoV-2 viral copies this assay can detect is 250 copies / mL. A negative result does not preclude SARS-CoV-2 infection and should not be used as the sole basis for treatment or other patient management decisions.  A negative result may occur with improper specimen collection / handling, submission of specimen other than nasopharyngeal swab, presence of viral mutation(s) within the areas targeted by this assay, and inadequate number of viral copies (<250 copies / mL). A negative result must be combined with clinical observations, patient history, and epidemiological information. Fact Sheet for Patients:   StrictlyIdeas.no Fact Sheet for Healthcare Providers: BankingDealers.co.za This test is not yet approved or cleared  by the [place] FDA and has been authorized for detection and/or diagnosis of SARS-CoV-2 by FDA  under an Emergency Use Authorization (EUA).  This EUA will remain in effect (meaning this test can be used) for the duration of the COVID-19 declaration under Section 564(b)(1) of the Act, 21 U.S.C. section 360bbb-3(b)(1), unless the authorization is terminated  or revoked sooner. Performed at [facility], [address] [place], [place] [address]   MRSA PCR Screening     Status: None   Collection Time: December 24, 2019  8:34 AM   Specimen: Nasopharyngeal  Result Value Ref Range Status   MRSA by PCR NEGATIVE NEGATIVE Final    Comment:        The GeneXpert MRSA Assay (FDA approved for NASAL specimens only), is one component of a comprehensive MRSA colonization surveillance program. It is not intended to diagnose MRSA infection nor to guide or monitor treatment for MRSA infections. Performed at [facility], [place] [address], [place], [place] [address]      Labs: Basic Metabolic Panel: Recent Labs  Lab 12/11/19 0454 12/11/19 0454 12/12/19 0428 12/12/19 0428 12/14/19 0354 12/14/19 0354 12/15/19 0354 2019-12-24 0447  NA 138  --  140  --  138  --  137 135  K 4.6   < > 4.2   < > 4.3   < > 3.8 4.0  CL 98  --  101  --  101  --  99 99  CO2 29  --  29  --  26  --  27 26  GLUCOSE 120*  --  103*  --  108*  --  109* 136*  BUN 28*  --  35*  --  29*  --  33* 32*  CREATININE 1.33*  --  1.31*  --  1.36*  --  1.28* 1.55*  CALCIUM 8.9  --  8.8*  --  8.9  --  8.5* 8.2*  MG 2.4  --   --   --   --   --   --   --    < > = values in this interval not displayed.   Liver Function Tests: No results for input(s): AST, ALT, ALKPHOS, BILITOT, PROT, ALBUMIN in the last 168 hours. No results for input(s): LIPASE, AMYLASE in the last 168 hours. No results for input(s): AMMONIA in the last 168 hours. CBC: Recent Labs  Lab 12/12/19 0428 12/14/19 0354 12/15/19 0354 2019-12-24 0447 12/24/19 0858  WBC 12.0* 10.0 10.0 19.8*  --   HGB 11.0* 11.4* 11.2* 10.5* 10.0*  HCT 34.3* 35.7* 34.9* 33.1* 31.8*  MCV 99.4 100.3* 100.0 100.6*  --   PLT 147* 161 165 208  --    Cardiac Enzymes: No results for input(s): CKTOTAL, CKMB, CKMBINDEX, TROPONINI in the last 168 hours. D-Dimer No results for input(s): DDIMER in the last 72 hours. BNP: Invalid  input(s): POCBNP CBG: Recent Labs  Lab 12/14/2019 0800 12/12/2019 1146 12/14/2019 1659 12/14/19 1213 12/14/19 1613  GLUCAP 94 113* 126* 119* 125*   Anemia work up No results for input(s): VITAMINB12, FOLATE, FERRITIN, TIBC, IRON, RETICCTPCT in the last 72 hours. Urinalysis    Component Value Date/Time   COLORURINE Amber 11/01/2011 1313   APPEARANCEUR Cloudy (A) 08/09/2019 0938   LABSPEC 1.024 11/01/2011 1313   PHURINE 5.0 11/01/2011 1313   GLUCOSEU Trace (A) 08/09/2019 0938   GLUCOSEU Negative 11/01/2011 1313   HGBUR 3+ 11/01/2011 1313   BILIRUBINUR Negative 08/09/2019 0938   BILIRUBINUR Negative 11/01/2011 1313   KETONESUR Negative 11/01/2011 1313   PROTEINUR 2+ (A) 08/09/2019 4818  PROTEINUR 100 mg/dL 11/01/2011 1313   NITRITE Negative 08/09/2019 0938   NITRITE Negative 11/01/2011 1313   LEUKOCYTESUR Negative 08/09/2019 0938   LEUKOCYTESUR 1+ 11/01/2011 1313   Sepsis Labs Invalid input(s): PROCALCITONIN,  WBC,  LACTICIDVEN     SIGNED:  Shelly Coss, MD  [facility] 12/17/2019, 7:55 AM Pager [phone]  If 7PM-7AM, please contact night-coverage [email or web address] Password [email or web address]

## 2020-01-10 NOTE — Progress Notes (Signed)
   01-03-20 0407  Assess: MEWS Score  BP (!) 78/58  Resp 18  Level of Consciousness Alert  Assess: MEWS Score  MEWS Temp 0  MEWS Systolic 2  MEWS Pulse 0  MEWS RR 0  MEWS LOC 0  MEWS Score 2  MEWS Score Color Yellow  Assess: if the MEWS score is Yellow or Red  Were vital signs taken at a resting state? Yes  Focused Assessment Documented focused assessment  Early Detection of Sepsis Score *See Row Information* Low  MEWS guidelines implemented *See Row Information* Yes  Treat  MEWS Interventions Escalated (See documentation below)  Take Vital Signs  Increase Vital Sign Frequency  Yellow: Q 2hr X 2 then Q 4hr X 2, if remains yellow, continue Q 4hrs  Escalate  MEWS: Escalate Yellow: discuss with charge nurse/RN and consider discussing with provider and RRT  Notify: Charge Nurse/RN  Name of Charge Nurse/RN Notified Amie RN  Date Charge Nurse/RN Notified January 03, 2020  Time Charge Nurse/RN Notified 0408  Notify: Provider  Provider Name/Title [facility]  Date Provider Notified 2020/01/03  Time Provider Notified [phone]  Notification Type Page  Notification Reason Change in status  Response See new orders  Date of Provider Response 03-Jan-2020  Time of Provider Response [phone]  Document  Patient Outcome Other (Comment) (Awaiting response from NP, will cont. to mont. pt)  Progress note created (see row info) Yes

## 2020-01-10 DEATH — deceased

## 2020-02-06 ENCOUNTER — Other Ambulatory Visit: Payer: Self-pay | Admitting: Urology

## 2020-07-07 IMAGING — DX DG CHEST 1V PORT
3 series · 3 of 3 positions shown · non-contrast
Comparison: Chest radiograph from earlier today.

CLINICAL DATA: Follow-up chest tube.

EXAM:
PORTABLE CHEST 1 VIEW

[chest ap (1 of 3)]
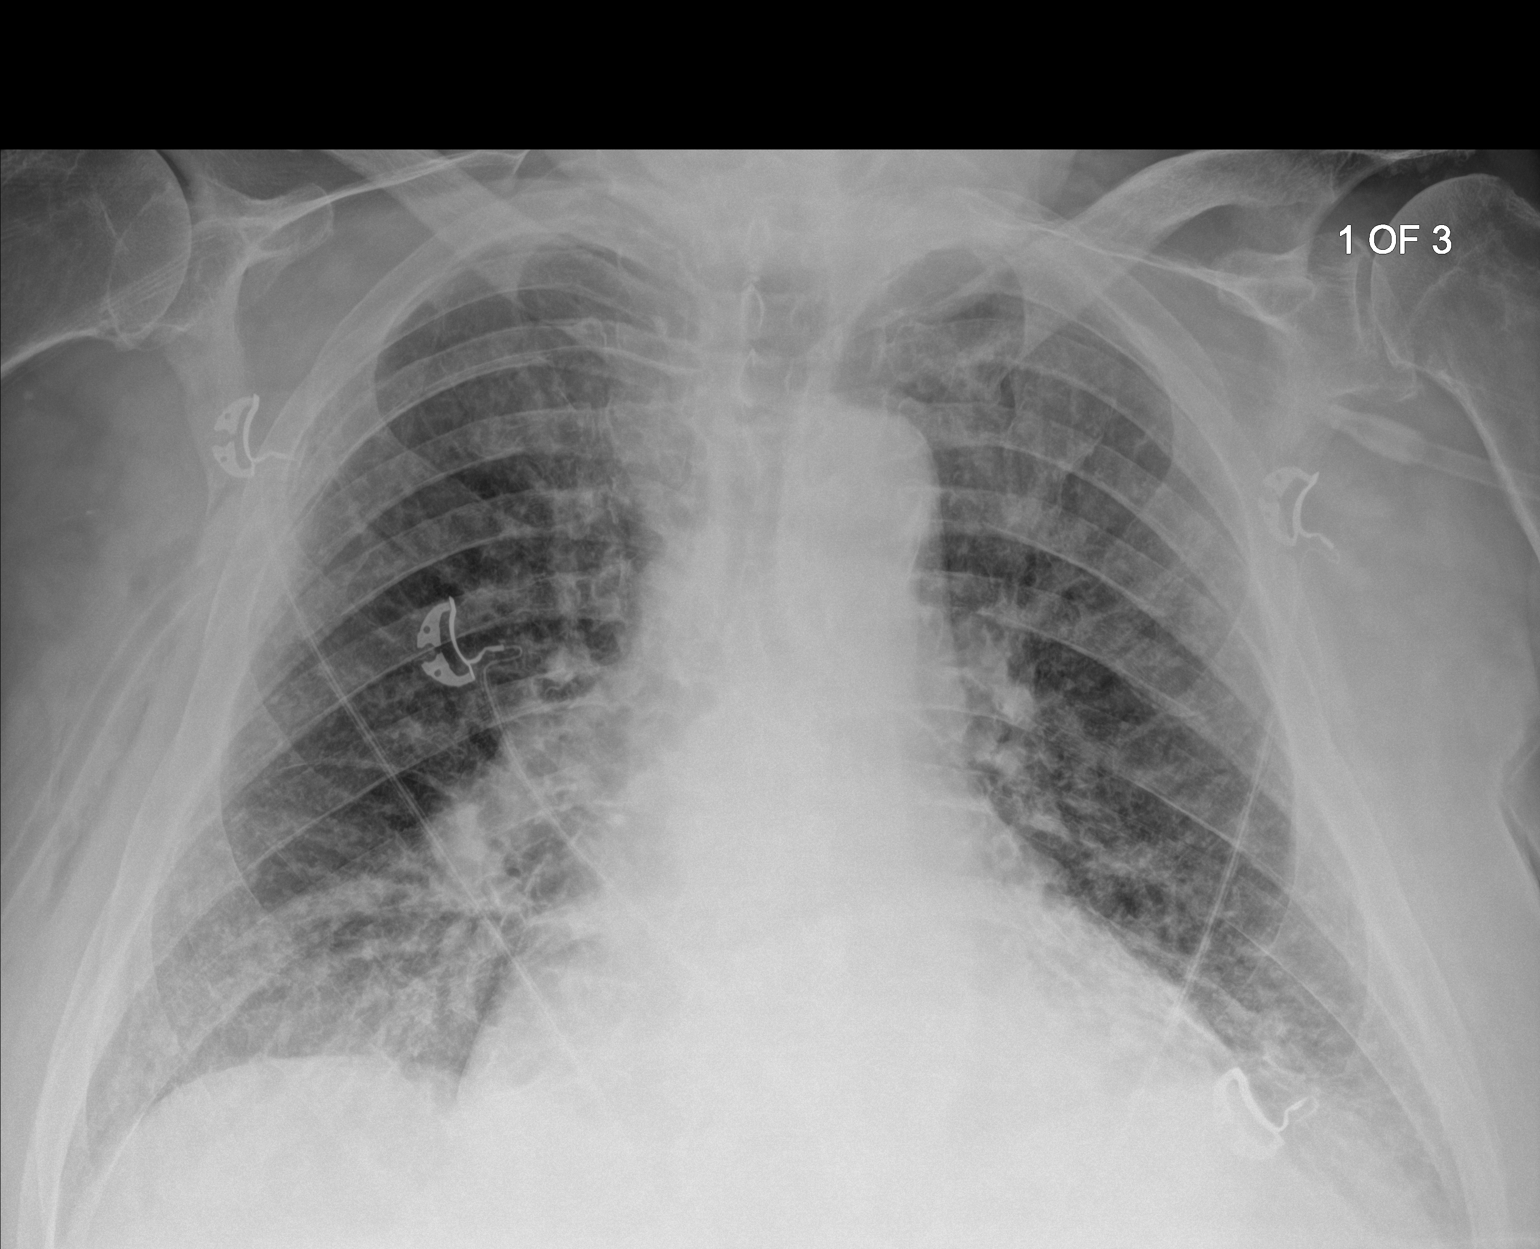

[chest ap (2 of 3)]
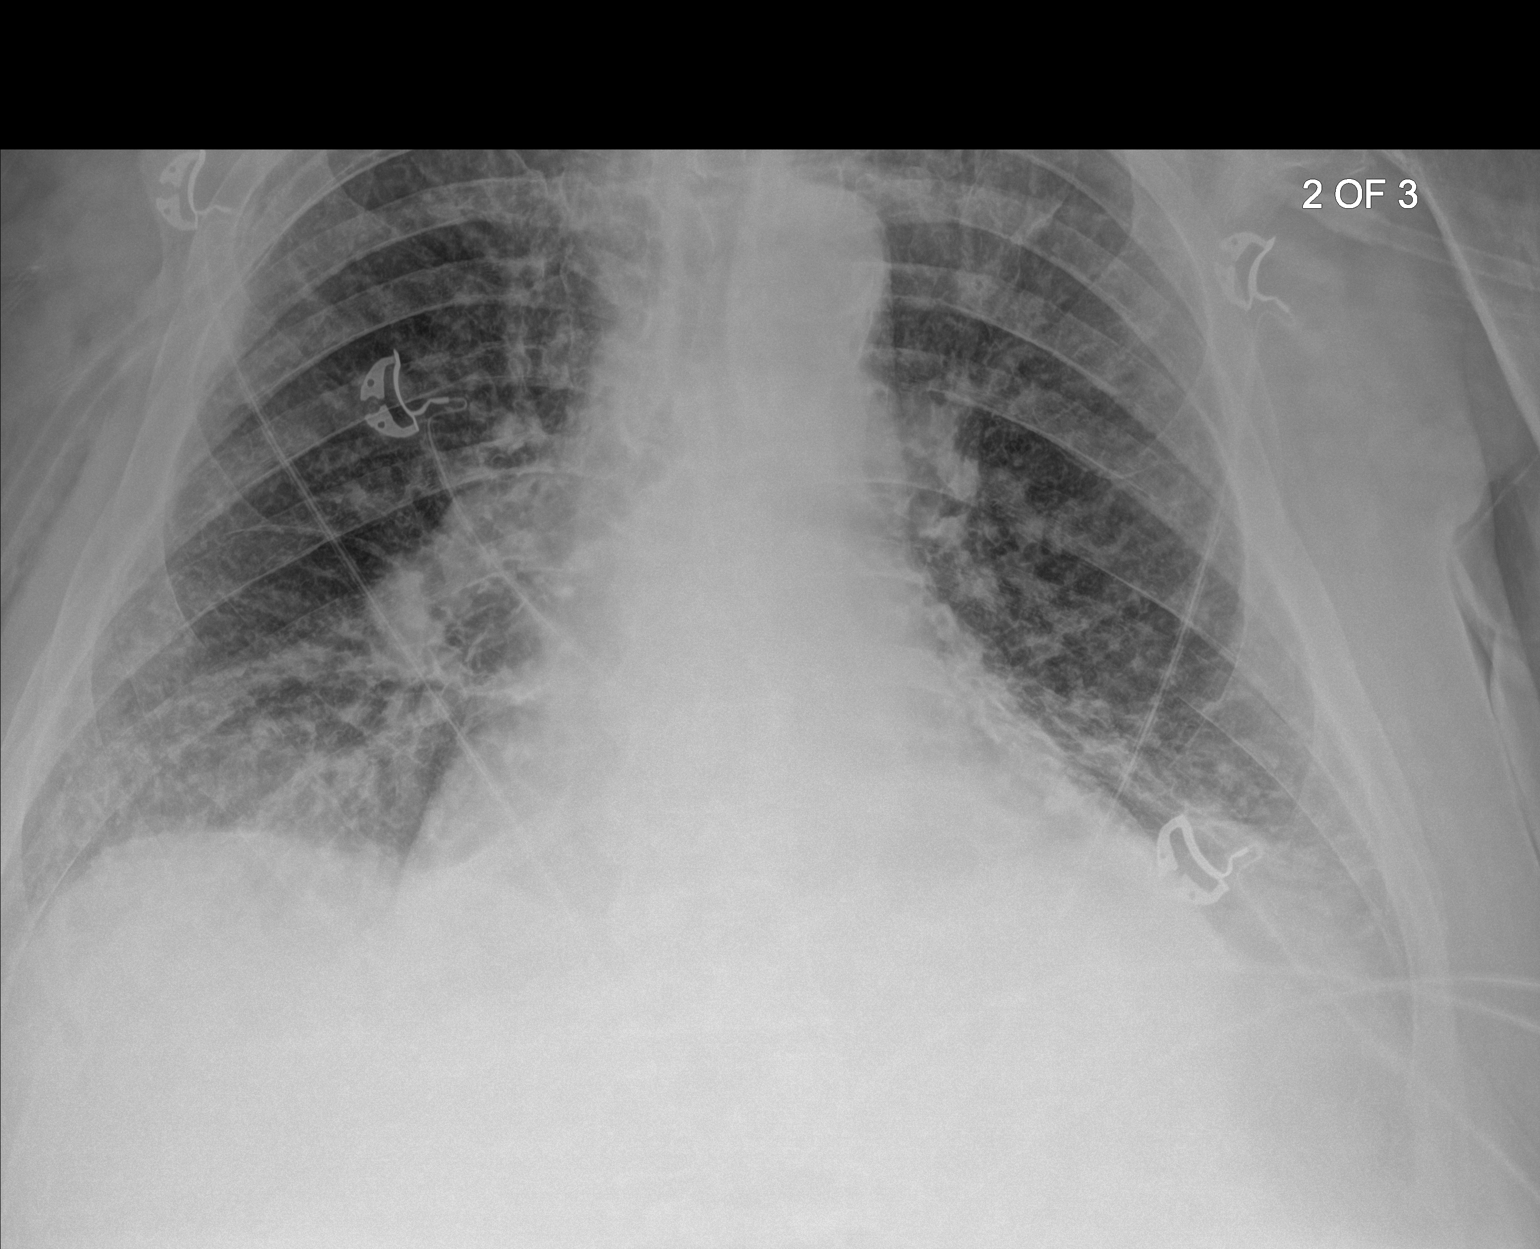

[chest ap (3 of 3)]
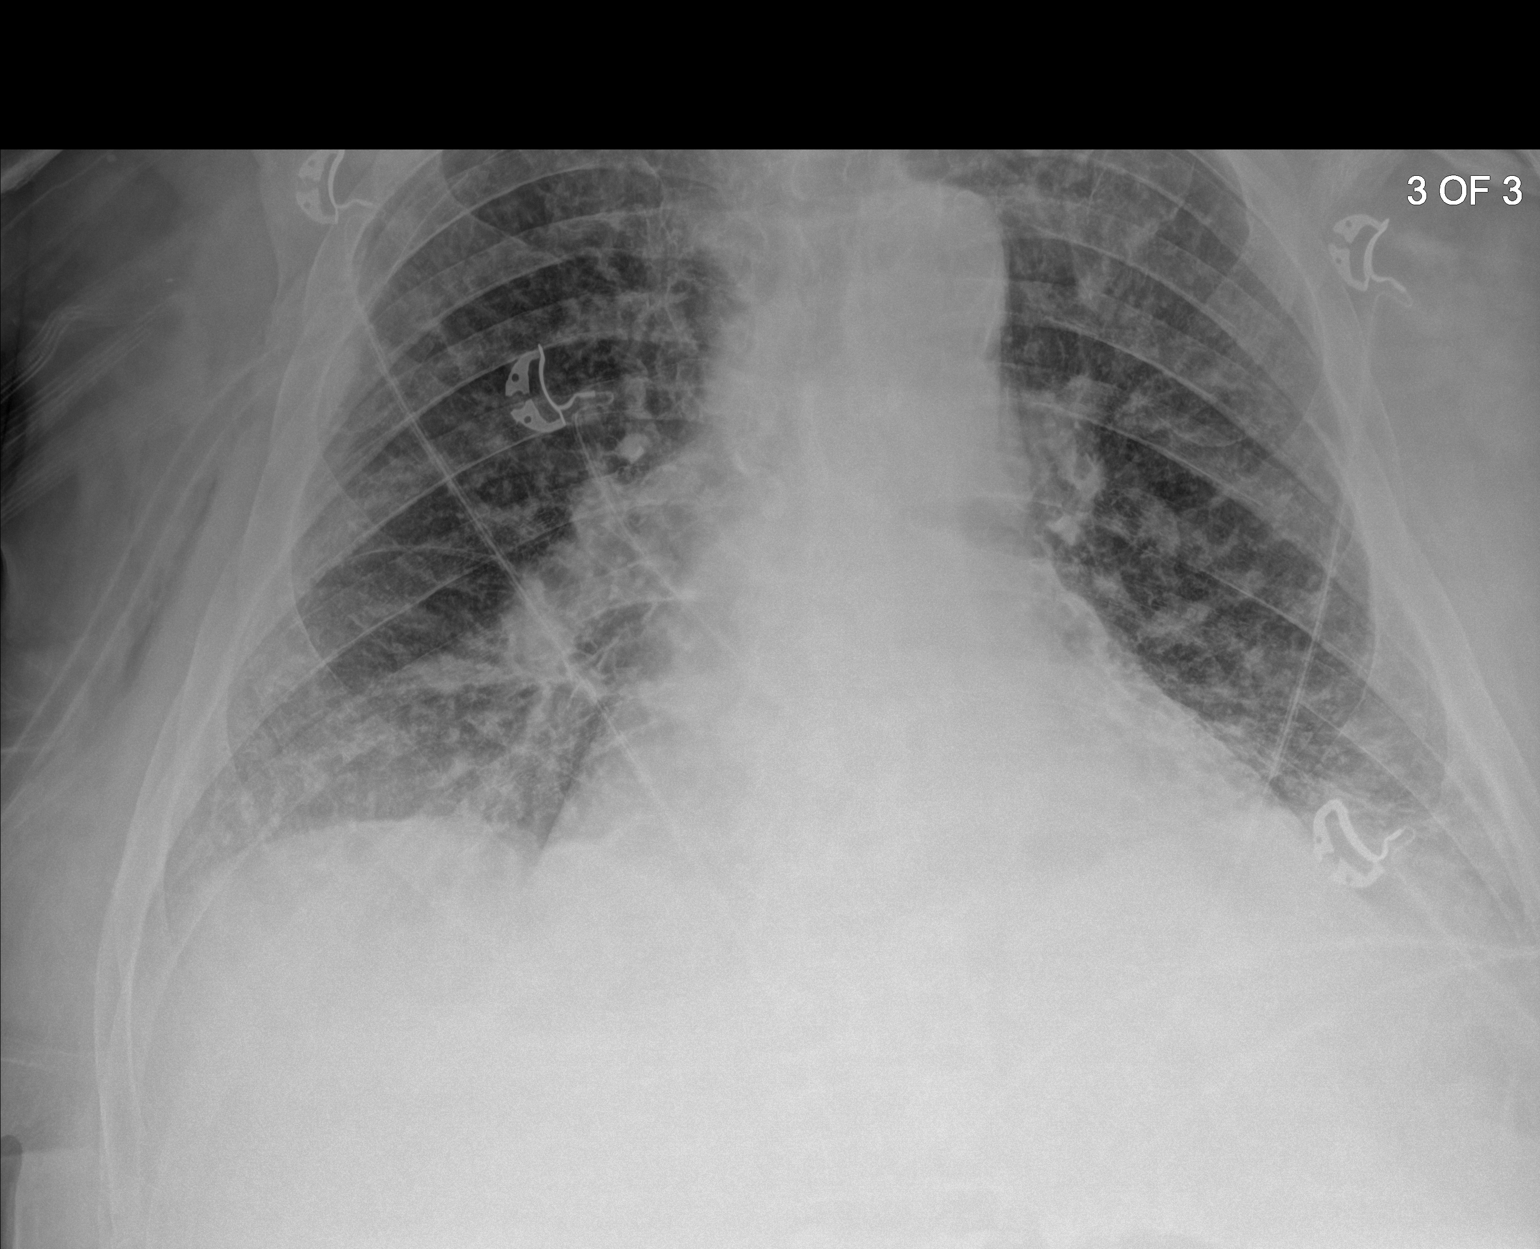

[3 of 3 positions shown; findings below may reference images not displayed]

FINDINGS: Right apical chest tube terminates in the medial right apical
pleural space. Stable cardiomediastinal silhouette with mild
cardiomegaly. No pneumothorax. No pleural effusion. Stable coarsened
interstitial opacities in both lungs with mild left basilar
atelectasis.
IMPRESSION: 1. No pneumothorax. Right apical chest tube in place.
2. Stable mild cardiomegaly.
3. Stable coarsened interstitial opacities in both lungs with mild
left basilar atelectasis.
